# Patient Record
Sex: Female | Born: 1945
Health system: Southern US, Community
[De-identification: ages and names within clinical notes are randomized; demographics above are authoritative.]

## PROBLEM LIST (undated history)

## (undated) DIAGNOSIS — F419 Anxiety disorder, unspecified: Secondary | ICD-10-CM

## (undated) DIAGNOSIS — H269 Unspecified cataract: Secondary | ICD-10-CM

## (undated) DIAGNOSIS — F329 Major depressive disorder, single episode, unspecified: Secondary | ICD-10-CM

## (undated) DIAGNOSIS — K649 Unspecified hemorrhoids: Secondary | ICD-10-CM

## (undated) DIAGNOSIS — M1990 Unspecified osteoarthritis, unspecified site: Secondary | ICD-10-CM

## (undated) DIAGNOSIS — F32A Depression, unspecified: Secondary | ICD-10-CM

## (undated) DIAGNOSIS — M12579 Traumatic arthropathy, unspecified ankle and foot: Secondary | ICD-10-CM

## (undated) DIAGNOSIS — R739 Hyperglycemia, unspecified: Secondary | ICD-10-CM

## (undated) DIAGNOSIS — E785 Hyperlipidemia, unspecified: Secondary | ICD-10-CM

## (undated) DIAGNOSIS — K219 Gastro-esophageal reflux disease without esophagitis: Secondary | ICD-10-CM

## (undated) DIAGNOSIS — D0372 Melanoma in situ of left lower limb, including hip: Secondary | ICD-10-CM

## (undated) DIAGNOSIS — K529 Noninfective gastroenteritis and colitis, unspecified: Secondary | ICD-10-CM

## (undated) DIAGNOSIS — J309 Allergic rhinitis, unspecified: Secondary | ICD-10-CM

## (undated) DIAGNOSIS — E669 Obesity, unspecified: Secondary | ICD-10-CM

## (undated) HISTORY — DX: Anxiety disorder, unspecified: F41.9

## (undated) HISTORY — DX: Depression, unspecified: F32.A

## (undated) HISTORY — PX: FRACTURE SURGERY: SHX138

## (undated) HISTORY — DX: Melanoma in situ of left lower limb, including hip: D03.72

## (undated) HISTORY — DX: Hyperglycemia, unspecified: R73.9

## (undated) HISTORY — PX: SPINE SURGERY: SHX786

## (undated) HISTORY — DX: Unspecified osteoarthritis, unspecified site: M19.90

## (undated) HISTORY — DX: Gastro-esophageal reflux disease without esophagitis: K21.9

## (undated) HISTORY — DX: Unspecified cataract: H26.9

## (undated) HISTORY — DX: Allergic rhinitis, unspecified: J30.9

## (undated) HISTORY — DX: Noninfective gastroenteritis and colitis, unspecified: K52.9

## (undated) HISTORY — DX: Obesity, unspecified: E66.9

## (undated) HISTORY — DX: Traumatic arthropathy, unspecified ankle and foot: M12.579

## (undated) HISTORY — DX: Major depressive disorder, single episode, unspecified: F32.9

## (undated) HISTORY — DX: Hyperlipidemia, unspecified: E78.5

## (undated) HISTORY — DX: Unspecified hemorrhoids: K64.9

## (undated) HISTORY — PX: OTHER SURGICAL HISTORY: SHX169

## (undated) HISTORY — PX: MASS EXCISION: SHX2000

## (undated) HISTORY — PX: TOTAL ANKLE REPLACEMENT: SUR1218

---

## 1978-10-13 HISTORY — PX: ABDOMINAL HYSTERECTOMY: SHX81

## 1988-11-11 HISTORY — PX: BREAST SURGERY: SHX581

## 1998-10-03 ENCOUNTER — Other Ambulatory Visit: Admission: RE | Admit: 1998-10-03 | Discharge: 1998-10-03 | Payer: Self-pay | Admitting: *Deleted

## 1999-10-23 ENCOUNTER — Other Ambulatory Visit: Admission: RE | Admit: 1999-10-23 | Discharge: 1999-10-23 | Payer: Self-pay | Admitting: *Deleted

## 1999-10-30 ENCOUNTER — Encounter: Admission: RE | Admit: 1999-10-30 | Discharge: 1999-10-30 | Payer: Self-pay | Admitting: *Deleted

## 1999-10-30 ENCOUNTER — Encounter: Payer: Self-pay | Admitting: *Deleted

## 1999-12-13 HISTORY — PX: OTHER SURGICAL HISTORY: SHX169

## 1999-12-16 ENCOUNTER — Ambulatory Visit (HOSPITAL_COMMUNITY): Admission: RE | Admit: 1999-12-16 | Discharge: 1999-12-16 | Payer: Self-pay | Admitting: Gastroenterology

## 1999-12-16 ENCOUNTER — Encounter: Payer: Self-pay | Admitting: Gastroenterology

## 1999-12-31 ENCOUNTER — Observation Stay (HOSPITAL_COMMUNITY): Admission: RE | Admit: 1999-12-31 | Discharge: 2000-01-01 | Payer: Self-pay | Admitting: Urology

## 2000-11-12 ENCOUNTER — Other Ambulatory Visit: Admission: RE | Admit: 2000-11-12 | Discharge: 2000-11-12 | Payer: Self-pay | Admitting: *Deleted

## 2000-11-29 ENCOUNTER — Emergency Department (HOSPITAL_COMMUNITY): Admission: EM | Admit: 2000-11-29 | Discharge: 2000-11-29 | Payer: Self-pay

## 2000-12-03 ENCOUNTER — Emergency Department (HOSPITAL_COMMUNITY): Admission: EM | Admit: 2000-12-03 | Discharge: 2000-12-03 | Payer: Self-pay | Admitting: Emergency Medicine

## 2002-11-16 ENCOUNTER — Other Ambulatory Visit: Admission: RE | Admit: 2002-11-16 | Discharge: 2002-11-16 | Payer: Self-pay | Admitting: Obstetrics and Gynecology

## 2002-12-08 ENCOUNTER — Emergency Department (HOSPITAL_COMMUNITY): Admission: EM | Admit: 2002-12-08 | Discharge: 2002-12-09 | Payer: Self-pay | Admitting: Emergency Medicine

## 2003-05-17 ENCOUNTER — Other Ambulatory Visit: Admission: RE | Admit: 2003-05-17 | Discharge: 2003-05-17 | Payer: Self-pay | Admitting: Obstetrics and Gynecology

## 2003-09-14 ENCOUNTER — Ambulatory Visit (HOSPITAL_COMMUNITY): Admission: RE | Admit: 2003-09-14 | Discharge: 2003-09-14 | Payer: Self-pay | Admitting: Oral Surgery

## 2004-06-17 ENCOUNTER — Other Ambulatory Visit: Admission: RE | Admit: 2004-06-17 | Discharge: 2004-06-17 | Payer: Self-pay | Admitting: Obstetrics and Gynecology

## 2004-06-19 ENCOUNTER — Ambulatory Visit: Payer: Self-pay | Admitting: Family Medicine

## 2004-07-02 ENCOUNTER — Ambulatory Visit: Payer: Self-pay | Admitting: Family Medicine

## 2004-07-30 ENCOUNTER — Ambulatory Visit: Payer: Self-pay | Admitting: Family Medicine

## 2004-07-31 ENCOUNTER — Ambulatory Visit: Payer: Self-pay | Admitting: Family Medicine

## 2004-08-09 ENCOUNTER — Encounter: Admission: RE | Admit: 2004-08-09 | Discharge: 2004-08-09 | Payer: Self-pay | Admitting: Family Medicine

## 2004-09-11 HISTORY — PX: CHOLECYSTECTOMY: SHX55

## 2004-10-10 ENCOUNTER — Inpatient Hospital Stay (HOSPITAL_COMMUNITY): Admission: EM | Admit: 2004-10-10 | Discharge: 2004-10-13 | Payer: Self-pay | Admitting: Emergency Medicine

## 2004-10-10 ENCOUNTER — Encounter (INDEPENDENT_AMBULATORY_CARE_PROVIDER_SITE_OTHER): Payer: Self-pay | Admitting: *Deleted

## 2004-10-10 ENCOUNTER — Ambulatory Visit: Payer: Self-pay | Admitting: Internal Medicine

## 2004-10-19 ENCOUNTER — Ambulatory Visit: Payer: Self-pay | Admitting: Family Medicine

## 2004-10-21 ENCOUNTER — Ambulatory Visit: Payer: Self-pay | Admitting: Internal Medicine

## 2004-10-28 ENCOUNTER — Ambulatory Visit: Payer: Self-pay | Admitting: Internal Medicine

## 2004-11-12 ENCOUNTER — Ambulatory Visit: Payer: Self-pay | Admitting: Family Medicine

## 2004-11-18 ENCOUNTER — Ambulatory Visit: Payer: Self-pay | Admitting: Internal Medicine

## 2004-11-19 ENCOUNTER — Ambulatory Visit: Payer: Self-pay | Admitting: Family Medicine

## 2004-12-05 ENCOUNTER — Ambulatory Visit: Payer: Self-pay | Admitting: Gastroenterology

## 2004-12-05 ENCOUNTER — Ambulatory Visit: Payer: Self-pay | Admitting: Internal Medicine

## 2004-12-30 ENCOUNTER — Encounter: Admission: RE | Admit: 2004-12-30 | Discharge: 2004-12-30 | Payer: Self-pay | Admitting: Family Medicine

## 2005-01-21 ENCOUNTER — Ambulatory Visit: Payer: Self-pay | Admitting: Gastroenterology

## 2005-05-29 ENCOUNTER — Ambulatory Visit: Payer: Self-pay | Admitting: Family Medicine

## 2005-07-30 ENCOUNTER — Other Ambulatory Visit: Admission: RE | Admit: 2005-07-30 | Discharge: 2005-07-30 | Payer: Self-pay | Admitting: Obstetrics and Gynecology

## 2005-10-16 ENCOUNTER — Ambulatory Visit: Payer: Self-pay | Admitting: Family Medicine

## 2005-11-13 ENCOUNTER — Ambulatory Visit: Payer: Self-pay | Admitting: Family Medicine

## 2005-11-20 ENCOUNTER — Ambulatory Visit: Payer: Self-pay | Admitting: Family Medicine

## 2006-05-14 ENCOUNTER — Ambulatory Visit: Payer: Self-pay | Admitting: Family Medicine

## 2006-07-14 ENCOUNTER — Encounter: Payer: Self-pay | Admitting: Family Medicine

## 2006-10-13 HISTORY — PX: OTHER SURGICAL HISTORY: SHX169

## 2006-11-03 ENCOUNTER — Ambulatory Visit (HOSPITAL_BASED_OUTPATIENT_CLINIC_OR_DEPARTMENT_OTHER): Admission: RE | Admit: 2006-11-03 | Discharge: 2006-11-03 | Payer: Self-pay | Admitting: Urology

## 2006-11-18 ENCOUNTER — Ambulatory Visit: Payer: Self-pay | Admitting: Family Medicine

## 2006-11-18 LAB — CONVERTED CEMR LAB
ALT: 24 units/L (ref 0–40)
AST: 21 units/L (ref 0–37)
Alkaline Phosphatase: 93 units/L (ref 39–117)
BUN: 20 mg/dL (ref 6–23)
Basophils Absolute: 0.1 10*3/uL (ref 0.0–0.1)
Basophils Relative: 0.6 % (ref 0.0–1.0)
Bilirubin, Direct: 0.1 mg/dL (ref 0.0–0.3)
Calcium: 9.5 mg/dL (ref 8.4–10.5)
Chloride: 108 meq/L (ref 96–112)
Cholesterol: 184 mg/dL (ref 0–200)
Eosinophils Absolute: 0.3 10*3/uL (ref 0.0–0.6)
HCT: 42 % (ref 36.0–46.0)
HDL: 47.8 mg/dL (ref >39.0)
LDL Cholesterol: 109 mg/dL — ABNORMAL HIGH (ref 0–99)
MCV: 87.9 fL (ref 78.0–100.0)
Monocytes Absolute: 0.4 10*3/uL (ref 0.2–0.7)
Neutrophils Relative %: 70.7 % (ref 43.0–77.0)
Potassium: 4.5 meq/L (ref 3.5–5.1)
RDW: 12 % (ref 11.5–14.6)
Sodium: 144 meq/L (ref 135–145)
Total CHOL/HDL Ratio: 3.8
Total Protein: 7.1 g/dL (ref 6.0–8.3)
Triglycerides: 134 mg/dL (ref 0–149)
WBC: 9.9 10*3/uL (ref 4.5–10.5)

## 2006-12-01 ENCOUNTER — Ambulatory Visit: Payer: Self-pay | Admitting: Family Medicine

## 2007-02-03 ENCOUNTER — Encounter: Payer: Self-pay | Admitting: Family Medicine

## 2007-04-08 ENCOUNTER — Encounter: Payer: Self-pay | Admitting: Family Medicine

## 2007-04-08 DIAGNOSIS — J309 Allergic rhinitis, unspecified: Secondary | ICD-10-CM | POA: Insufficient documentation

## 2007-04-08 DIAGNOSIS — K219 Gastro-esophageal reflux disease without esophagitis: Secondary | ICD-10-CM | POA: Insufficient documentation

## 2007-04-14 HISTORY — PX: OTHER SURGICAL HISTORY: SHX169

## 2007-06-23 ENCOUNTER — Telehealth: Payer: Self-pay | Admitting: Family Medicine

## 2007-08-02 ENCOUNTER — Encounter: Payer: Self-pay | Admitting: Family Medicine

## 2007-08-15 LAB — CONVERTED CEMR LAB
Pap Smear: NORMAL
Pap Smear: NORMAL

## 2007-11-25 ENCOUNTER — Ambulatory Visit: Payer: Self-pay | Admitting: Family Medicine

## 2007-11-25 LAB — CONVERTED CEMR LAB
Glucose, Urine, Semiquant: NEGATIVE
Ketones, urine, test strip: NEGATIVE
Nitrite: NEGATIVE
Specific Gravity, Urine: 1.02
WBC Urine, dipstick: NEGATIVE
pH: 5

## 2007-12-02 ENCOUNTER — Ambulatory Visit: Payer: Self-pay | Admitting: Family Medicine

## 2007-12-02 DIAGNOSIS — F341 Dysthymic disorder: Secondary | ICD-10-CM

## 2007-12-02 DIAGNOSIS — R262 Difficulty in walking, not elsewhere classified: Secondary | ICD-10-CM | POA: Insufficient documentation

## 2007-12-02 LAB — CONVERTED CEMR LAB
ALT: 25 units/L (ref 0–35)
AST: 22 units/L (ref 0–37)
Albumin: 3.9 g/dL (ref 3.5–5.2)
Alkaline Phosphatase: 72 units/L (ref 39–117)
Basophils Relative: 0.6 % (ref 0.0–1.0)
Bilirubin, Direct: 0.1 mg/dL (ref 0.0–0.3)
Chloride: 108 meq/L (ref 96–112)
Creatinine, Ser: 1 mg/dL (ref 0.4–1.2)
HCT: 39 % (ref 36.0–46.0)
HDL: 39.5 mg/dL (ref >39.0)
Hemoglobin: 13.2 g/dL (ref 12.0–15.0)
Lymphocytes Relative: 23.8 % (ref 12.0–46.0)
MCV: 88.7 fL (ref 78.0–100.0)
Monocytes Absolute: 0.5 10*3/uL (ref 0.1–1.0)
Neutro Abs: 5 10*3/uL (ref 1.4–7.7)
Neutrophils Relative %: 67.5 % (ref 43.0–77.0)
Platelets: 234 10*3/uL (ref 150–400)
Potassium: 4.2 meq/L (ref 3.5–5.1)
Sodium: 146 meq/L — ABNORMAL HIGH (ref 135–145)
TSH: 2 microintl units/mL (ref 0.35–5.50)
Total CHOL/HDL Ratio: 4.9
VLDL: 33 mg/dL (ref 0–40)
WBC: 7.4 10*3/uL (ref 4.5–10.5)

## 2008-09-28 ENCOUNTER — Encounter: Payer: Self-pay | Admitting: Family Medicine

## 2008-09-29 ENCOUNTER — Ambulatory Visit (HOSPITAL_COMMUNITY): Admission: RE | Admit: 2008-09-29 | Discharge: 2008-09-30 | Payer: Self-pay | Admitting: Neurosurgery

## 2008-10-18 ENCOUNTER — Encounter: Payer: Self-pay | Admitting: Family Medicine

## 2008-11-27 ENCOUNTER — Encounter: Payer: Self-pay | Admitting: Family Medicine

## 2008-12-18 ENCOUNTER — Ambulatory Visit: Payer: Self-pay | Admitting: Family Medicine

## 2008-12-18 LAB — CONVERTED CEMR LAB
Bilirubin Urine: NEGATIVE
Blood in Urine, dipstick: NEGATIVE
Glucose, Urine, Semiquant: NEGATIVE
Nitrite: NEGATIVE
Protein, U semiquant: NEGATIVE
Urobilinogen, UA: 0.2
WBC Urine, dipstick: NEGATIVE
pH: 5.5

## 2008-12-26 ENCOUNTER — Ambulatory Visit: Payer: Self-pay | Admitting: Family Medicine

## 2008-12-26 ENCOUNTER — Telehealth: Payer: Self-pay | Admitting: Family Medicine

## 2008-12-26 DIAGNOSIS — E669 Obesity, unspecified: Secondary | ICD-10-CM

## 2008-12-26 DIAGNOSIS — Z9889 Other specified postprocedural states: Secondary | ICD-10-CM

## 2008-12-26 LAB — CONVERTED CEMR LAB
ALT: 38 units/L — ABNORMAL HIGH (ref 0–35)
AST: 32 units/L (ref 0–37)
Albumin: 3.7 g/dL (ref 3.5–5.2)
Alkaline Phosphatase: 65 units/L (ref 39–117)
Basophils Relative: 0.7 % (ref 0.0–3.0)
Bilirubin, Direct: 0.1 mg/dL (ref 0.0–0.3)
CO2: 31 meq/L (ref 19–32)
CRP, High Sensitivity: 10 — ABNORMAL HIGH (ref 0.00–5.00)
Chloride: 112 meq/L (ref 96–112)
Creatinine, Ser: 0.8 mg/dL (ref 0.4–1.2)
Eosinophils Absolute: 0.2 10*3/uL (ref 0.0–0.7)
Eosinophils Relative: 2.8 % (ref 0.0–5.0)
Glucose, Bld: 115 mg/dL — ABNORMAL HIGH (ref 70–99)
HDL: 47.5 mg/dL (ref >39.00)
MCHC: 35 g/dL (ref 30.0–36.0)
Monocytes Absolute: 0.4 10*3/uL (ref 0.1–1.0)
Monocytes Relative: 5.2 % (ref 3.0–12.0)
Neutrophils Relative %: 66.5 % (ref 43.0–77.0)
Platelets: 200 10*3/uL (ref 150.0–400.0)
RBC: 4.18 M/uL (ref 3.87–5.11)
Sodium: 145 meq/L (ref 135–145)
Total CHOL/HDL Ratio: 4
VLDL: 29.2 mg/dL (ref 0.0–40.0)

## 2009-02-01 ENCOUNTER — Encounter: Payer: Self-pay | Admitting: Family Medicine

## 2009-02-28 ENCOUNTER — Encounter: Payer: Self-pay | Admitting: Family Medicine

## 2009-03-28 ENCOUNTER — Ambulatory Visit: Payer: Self-pay | Admitting: Family Medicine

## 2009-04-11 LAB — CONVERTED CEMR LAB
AST: 24 units/L (ref 0–37)
Bilirubin, Direct: 0 mg/dL (ref 0.0–0.3)
HDL: 44.5 mg/dL (ref >39.00)
LDL Cholesterol: 58 mg/dL (ref 0–99)
Total CHOL/HDL Ratio: 3
Triglycerides: 167 mg/dL — ABNORMAL HIGH (ref 0.0–149.0)

## 2009-04-12 ENCOUNTER — Telehealth: Payer: Self-pay

## 2009-04-12 ENCOUNTER — Ambulatory Visit: Payer: Self-pay | Admitting: Family Medicine

## 2009-08-20 ENCOUNTER — Ambulatory Visit: Payer: Self-pay | Admitting: Family Medicine

## 2009-10-02 ENCOUNTER — Telehealth: Payer: Self-pay | Admitting: Family Medicine

## 2009-12-25 ENCOUNTER — Ambulatory Visit: Payer: Self-pay | Admitting: Family Medicine

## 2010-01-01 ENCOUNTER — Ambulatory Visit: Payer: Self-pay | Admitting: Family Medicine

## 2010-01-01 DIAGNOSIS — M12579 Traumatic arthropathy, unspecified ankle and foot: Secondary | ICD-10-CM | POA: Insufficient documentation

## 2010-01-02 LAB — CONVERTED CEMR LAB
ALT: 23 units/L (ref 0–35)
AST: 22 units/L (ref 0–37)
Albumin: 4 g/dL (ref 3.5–5.2)
BUN: 14 mg/dL (ref 6–23)
Bilirubin, Direct: 0.1 mg/dL (ref 0.0–0.3)
CO2: 31 meq/L (ref 19–32)
Chloride: 106 meq/L (ref 96–112)
Cholesterol: 141 mg/dL (ref 0–200)
Creatinine, Ser: 1 mg/dL (ref 0.4–1.2)
Eosinophils Absolute: 0.2 10*3/uL (ref 0.0–0.7)
Glucose, Bld: 105 mg/dL — ABNORMAL HIGH (ref 70–99)
Lymphocytes Relative: 20.7 % (ref 12.0–46.0)
MCHC: 34 g/dL (ref 30.0–36.0)
Monocytes Relative: 5.1 % (ref 3.0–12.0)
Neutrophils Relative %: 71.7 % (ref 43.0–77.0)
Nitrite: NEGATIVE
Platelets: 250 10*3/uL (ref 150.0–400.0)
Potassium: 4.8 meq/L (ref 3.5–5.1)
RBC: 4.32 M/uL (ref 3.87–5.11)
RDW: 13.2 % (ref 11.5–14.6)
Specific Gravity, Urine: 1.02
Total Protein: 6.9 g/dL (ref 6.0–8.3)
Triglycerides: 172 mg/dL — ABNORMAL HIGH (ref 0.0–149.0)
WBC Urine, dipstick: NEGATIVE
pH: 5.5

## 2010-01-03 ENCOUNTER — Telehealth: Payer: Self-pay | Admitting: Family Medicine

## 2010-02-06 ENCOUNTER — Encounter: Payer: Self-pay | Admitting: Family Medicine

## 2010-03-04 ENCOUNTER — Ambulatory Visit: Payer: Self-pay | Admitting: Family Medicine

## 2010-03-04 DIAGNOSIS — N390 Urinary tract infection, site not specified: Secondary | ICD-10-CM

## 2010-03-04 LAB — CONVERTED CEMR LAB
Ketones, urine, test strip: NEGATIVE
Nitrite: NEGATIVE
Urobilinogen, UA: 0.2

## 2010-03-05 ENCOUNTER — Encounter: Payer: Self-pay | Admitting: Family Medicine

## 2010-04-16 ENCOUNTER — Ambulatory Visit: Payer: Self-pay | Admitting: Family Medicine

## 2010-05-16 ENCOUNTER — Ambulatory Visit: Payer: Self-pay | Admitting: Family Medicine

## 2010-05-16 LAB — CONVERTED CEMR LAB
Bilirubin Urine: NEGATIVE
Protein, U semiquant: NEGATIVE
Specific Gravity, Urine: <1.005
Urobilinogen, UA: 0.2

## 2010-05-17 ENCOUNTER — Encounter: Payer: Self-pay | Admitting: Family Medicine

## 2010-06-13 HISTORY — PX: JOINT REPLACEMENT: SHX530

## 2010-06-26 ENCOUNTER — Encounter: Payer: Self-pay | Admitting: Family Medicine

## 2010-08-15 NOTE — Assessment & Plan Note (Signed)
Summary: Uti/dm   Vital Signs:  Patient profile:   65 year old female Weight:      205 pounds Temp:     98.4 degrees F oral BP sitting:   120 / 80  (left arm) Cuff size:   large  Vitals Entered By: Sid Falcon LPN (May 16, 2010 3:00 PM)  History of Present Illness: Patient seen with onset this morning burning with urination and frequency. One leftover Bactrim double strength which she took. Denies fever, nausea, vomiting, or any back pain. Drinking plenty of fluids. no vag discharge.  Allergies: 1)  ! Percocet (Oxycodone-Acetaminophen) 2)  ! * Latex 3)  Hydrocodone-Acetaminophen (Hydrocodone-Acetaminophen)  Past History:  Past Medical History: Last updated: 04/08/2007 Allergic rhinitis GERD PMH reviewed for relevance  Review of Systems  The patient denies fever, abdominal pain, hematuria, and incontinence.    Physical Exam  General:  Well-developed,well-nourished,in no acute distress; alert,appropriate and cooperative throughout examination Neck:  No deformities, masses, or tenderness noted. Lungs:  Normal respiratory effort, chest expands symmetrically. Lungs are clear to auscultation, no crackles or wheezes. Heart:  normal rate and regular rhythm.     Impression & Recommendations:  Problem # 1:  UTI (ICD-599.0) urine cx and start Cipro. Her updated medication list for this problem includes:    Ciprofloxacin Hcl 500 Mg Tabs (Ciprofloxacin hcl) ..... One by mouth two times a day for 7 days  Orders: UA Dipstick w/o Micro (manual) (16109) T-Culture, Urine (60454-09811)  Complete Medication List: 1)  Bayer Low Strength 81 Mg Tbec (Aspirin) 2)  Multivitamins Tabs (Multiple vitamin) 3)  Allegra 180 Mg Tabs (Fexofenadine hcl) .Marland Kitchen.. 1 by mouth once daily 4)  Alprazolam 1 Mg Tabs (Alprazolam) .Marland Kitchen.. 1 morning midafternoon and 2 hs for stress 5)  Budeprion Xl 300 Mg Tb24 (Bupropion hcl) .Marland Kitchen.. 1 by mouth once daily 6)  Lipitor 20 Mg Tabs (Atorvastatin calcium) .Marland Kitchen..  1 once daily for lipids 7)  Caltrate 600+d 600-400 Mg-unit Tabs (Calcium carbonate-vitamin d) .... Take 1 tablet by mouth two times a day 8)  Celebrex 200 Mg Caps (Celecoxib) .... Once daily 9)  Ciprofloxacin Hcl 500 Mg Tabs (Ciprofloxacin hcl) .... One by mouth two times a day for 7 days  Patient Instructions: 1)  Drink plenty of fluids up to 3-4 quarts a day. Cranberry juice is especially recommended in addition to large amounts of water. Avoid caffeine & carbonated drinks, they tend to irritate the bladder, Return in 3-5 days if you're not better: sooner if you're feeling worse.  Prescriptions: CIPROFLOXACIN HCL 500 MG TABS (CIPROFLOXACIN HCL) one by mouth two times a day for 7 days  #14 x 0   Entered and Authorized by:   Evelena Peat MD   Signed by:   Evelena Peat MD on 05/16/2010   Method used:   Electronically to        [facility]. [phone]* (retail)       [phone] [address]       [place], [place]  [address]       Ph: [phone]       Fax: [phone]   RxID:   [id number]    Orders Added: 1)  UA Dipstick w/o Micro (manual) [81002] 2)  Est. Patient Level III [25956] 3)  T-Culture, Urine [[id number]]    Laboratory Results   Urine Tests    Routine Urinalysis   Color: lt. yellow Appearance: Hazy Glucose: negative   (  Normal Range: Negative) Bilirubin: negative   (Normal Range: Negative) Ketone: negative   (Normal Range: Negative) Spec. Gravity: <1.005   (Normal Range: 1.003-1.035) Blood: negative   (Normal Range: Negative) pH: 5.0   (Normal Range: 5.0-8.0) Protein: negative   (Normal Range: Negative) Urobilinogen: 0.2   (Normal Range: 0-1) Nitrite: negative   (Normal Range: Negative) Leukocyte Esterace: moderate   (Normal Range: Negative)    Comments: Sid Falcon LPN  May 16, 2010 3:09 PM

## 2010-08-15 NOTE — Assessment & Plan Note (Signed)
Summary: cpx/no pap/njr   Vital Signs:  Patient profile:   65 year old female Height:      68 inches Weight:      208 pounds BMI:     31.74 O2 Sat:      97 % Temp:     98.2 degrees F Pulse rate:   86 / minute Pulse rhythm:   regular BP sitting:   140 / 80  (left arm)  Vitals Entered By: Julie Spice, RN (January 01, 2010 8:53 AM)  CC: cpx no pap saw Dr Julie [name] last week for papPt did not want EKG done today denies any chest pain or discomfort/   History of Present Illness: This 65 year old white married female in for a complete physical examination Relates a history of having fractured her right ankle at age 7, to have a hiatal replacement at [facility] Complained of being tired all complained of pain over the right lower back over the sacroiliac joint and taken ibuprofen for treatment and relief Patient had cystoscopy in 2005 by Dr. Jacquelyne [name] who well urologically at this time The patient under considerable stress has multiple family problems her and needs to continue on alprazolam and Wellbutrin. Past by Dr. Rana [name] gynecologist this year. Had mammogram February 2000 level and and bone density Lungs stopping exam done 2006 by Dr. Doreatha [name] a bile  Allergies: 1)  ! Percocet (Oxycodone-Acetaminophen) 2)  ! * Latex  Past History:  Past Medical History: Last updated: 04/08/2007 Allergic rhinitis GERD  Past Surgical History: Last updated: 12/26/2008 Cholecystectomy  03/06 back surgery for ruptured disc  march 2010 by dr Julie [name]   Risk Factors: Smoking Status: never (04/08/2007)  Review of Systems      See HPI  The patient denies anorexia, fever, weight loss, weight gain, vision loss, decreased hearing, hoarseness, chest pain, syncope, dyspnea on exertion, peripheral edema, prolonged cough, headaches, hemoptysis, abdominal pain, melena, hematochezia, severe indigestion/heartburn, hematuria, incontinence, genital sores, muscle weakness, suspicious skin lesions, transient blindness,  difficulty walking, depression, unusual weight change, abnormal bleeding, enlarged lymph nodes, angioedema, breast masses, and testicular masses.    Physical Exam  General:  Well-developed,well-nourished,in no acute distress; alert,appropriate and cooperative throughout examination Head:  Normocephalic and atraumatic without obvious abnormalities. No apparent alopecia or balding. Eyes:  No corneal or conjunctival inflammation noted. EOMI. Perrla. Funduscopic exam benign, without hemorrhages, exudates or papilledema. Vision grossly normal. Ears:  External ear exam shows no significant lesions or deformities.  Otoscopic examination reveals clear canals, tympanic membranes are intact bilaterally without bulging, retraction, inflammation or discharge. Hearing is grossly normal bilaterally. Nose:  pale nasal mucosa was boggy with clear drainage Mouth:  Oral mucosa and oropharynx without lesions or exudates.  Teeth in good repair. Neck:  No deformities, masses, or tenderness noted. Chest Wall:  No deformities, masses, or tenderness noted. Breasts:  gynecologist examined Lungs:  Normal respiratory effort, chest expands symmetrically. Lungs are clear to auscultation, no crackles or wheezes. Heart:  Normal rate and regular rhythm. S1 and S2 normal without gallop, murmur, click, rub or other extra sounds. Abdomen:  Bowel sounds positive,abdomen soft and non-tender without masses, organomegaly or hernias noted. Rectal:  oncologist examined Genitalia:  GYN exam Msk:  Limited movement and tenderness of the right ankle Pulses:  R and L carotid,radial,femoral,dorsalis pedis and posterior tibial pulses are full and equal bilaterally Extremities:  No clubbing, cyanosis, edema, or deformity noted with normal full range of motion of all joints.   Neurologic:  No cranial nerve  deficits noted. Station and gait are normal. Plantar reflexes are down-going bilaterally. DTRs are symmetrical throughout. Sensory, motor  and coordinative functions appear intact. Skin:  Intact without suspicious lesions or rashes Cervical Nodes:  No lymphadenopathy noted Axillary Nodes:  No palpable lymphadenopathy Inguinal Nodes:  No significant adenopathy Psych:  Cognition and judgment appear intact. Alert and cooperative with normal attention span and concentration. No apparent delusions, illusions, hallucinations   Impression & Recommendations:  Problem # 1:  ARTHRITIS, TRAUMATIC, ANKLE (ICD-716.17) Assessment Deteriorated ankle replacement planned  Problem # 2:  LAMINECTOMY, LUMBAR, HX OF (ICD-V45.89) Assessment: Unchanged  Problem # 3:  PHYSICAL EXAMINATION (ICD-V70.0) Assessment: Unchanged  Problem # 4:  ANXIETY DEPRESSION (ICD-300.4) Assessment: Unchanged  Problem # 5:  EXOGENOUS OBESITY (ICD-278.00) Assessment: Unchanged  Problem # 6:  ALLERGIC RHINITIS (ICD-477.9) Assessment: Unchanged  Her updated medication list for this problem includes:    Allegra 180 Mg Tabs (Fexofenadine hcl) .Marland Kitchen... 1 by mouth once daily  Complete Medication List: 1)  Bayer Low Strength 81 Mg Tbec (Aspirin) 2)  Multivitamins Tabs (Multiple vitamin) 3)  Allegra 180 Mg Tabs (Fexofenadine hcl) .Marland Kitchen.. 1 by mouth once daily 4)  Alprazolam 1 Mg Tabs (Alprazolam) .Marland Kitchen.. 1 morning midafternoon and 2 hs for stress 5)  Budeprion Xl 300 Mg Tb24 (Bupropion hcl) .Marland Kitchen.. 1 by mouth once daily 6)  Lipitor 20 Mg Tabs (Atorvastatin calcium) .Marland Kitchen.. 1 once daily for lipids 7)  Caltrate 600+d 600-400 Mg-unit Tabs (Calcium carbonate-vitamin d) .... Take 1 tablet by mouth two times a day 8)  Ciprofloxacin Hcl 500 Mg Tabs (Ciprofloxacin hcl) .Marland Kitchen.. 1 by mouth two times a day  Patient Instructions: 1)  You need to lose weight. Consider a lower calorie diet and regular exercise.  2)  Since having ankle replacement you need to lose wight 3)  reilled medications 4)  physical exam negative otherwise Prescriptions: CIPROFLOXACIN HCL 500 MG TABS (CIPROFLOXACIN  HCL) 1 by mouth two times a day  #20 x 0   Entered by:   Julie Spice, RN   Authorized by:   Judithann Sheen MD   Signed by:   Julie Spice, RN on 01/03/2010   Method used:   Electronically to        [facility]. [phone]* (retail)       [phone] [address]       [place], [place]  [address]       Ph: [phone]       Fax: [phone]   RxID:   [id number] ALPRAZOLAM 1 MG  TABS (ALPRAZOLAM) 1 morning midafternoon and 2 hs for stress  #100 x 5   Entered and Authorized by:   Judithann Sheen MD   Signed by:   Judithann Sheen MD on 01/01/2010   Method used:   Print then Give to Patient   RxID:   [id number] LIPITOR 20 MG TABS (ATORVASTATIN CALCIUM) 1 once daily for lipids  #30 x 11   Entered and Authorized by:   Judithann Sheen MD   Signed by:   Judithann Sheen MD on 01/01/2010   Method used:   Electronically to        [facility]. [phone]* (retail)       [phone] [address]       [place], [place]  [address]  Ph: [phone]       Fax: [phone]   RxID:   [id number] BUDEPRION XL 300 MG  TB24 (BUPROPION HCL) 1 by mouth once daily  #30 x 11   Entered and Authorized by:   Judithann Sheen MD   Signed by:   Judithann Sheen MD on 01/01/2010   Method used:   Electronically to        [facility]. [phone]* (retail)       [phone] [address]       [place], [place]  [address]       Ph: [phone]       Fax: [phone]   RxID:   [id number] ALLEGRA 180 MG  TABS (FEXOFENADINE HCL) 1 by mouth once daily  #30 x 11   Entered and Authorized by:   Judithann Sheen MD   Signed by:   Judithann Sheen MD on 01/01/2010   Method used:   Electronically to        [facility]. [phone]* (retail)       [phone] [address]       [place], [place]  [address]       Ph: [phone]       Fax: [phone]    RxID:   [id number]

## 2010-08-15 NOTE — Assessment & Plan Note (Signed)
Summary: COLD, CONGESTION // RS   Vital Signs:  Patient profile:   65 year old female Weight:      203 pounds Temp:     99.3 degrees F oral BP sitting:   122 / 88  (left arm) Cuff size:   large  Vitals Entered By: Sid Falcon LPN (August 20, 2009 1:54 PM) CC: Cold, congesdtion X 1 month   History of Present Illness: Acute visit for 3 weeks of cough and initially this started as a viral URI. Her nasal congestive symptoms have improved. Cough is mostly dry. No fever or wheezing. No history of smoking. Denies any dyspnea, pleuritic pain, or hemoptysis. Has tried over-the-counter cough medicine with good suppression of night time cough.  Allergies: 1)  ! Percocet (Oxycodone-Acetaminophen)  Past History:  Past Medical History: Last updated: 04/08/2007 Allergic rhinitis GERD PMH-FH-SH reviewed for relevance  Review of Systems  The patient denies anorexia, fever, weight loss, syncope, dyspnea on exertion, peripheral edema, and hemoptysis.    Physical Exam  General:  Well-developed,well-nourished,in no acute distress; alert,appropriate and cooperative throughout examination Ears:  External ear exam shows no significant lesions or deformities.  Otoscopic examination reveals clear canals, tympanic membranes are intact bilaterally without bulging, retraction, inflammation or discharge. Hearing is grossly normal bilaterally. Nose:  External nasal examination shows no deformity or inflammation. Nasal mucosa are pink and moist without lesions or exudates. Mouth:  Oral mucosa and oropharynx without lesions or exudates.  Teeth in good repair. Neck:  No deformities, masses, or tenderness noted. Lungs:  Normal respiratory effort, chest expands symmetrically. Lungs are clear to auscultation, no crackles or wheezes. Heart:  Normal rate and regular rhythm. S1 and S2 normal without gallop, murmur, click, rub or other extra sounds.   Impression & Recommendations:  Problem # 1:  COUGH  (ICD-786.2) suspect related to recent acute bronchitis. Given duration of cough we'll start antibiotic. Try over-the-counter Mucinex  Complete Medication List: 1)  Bayer Low Strength 81 Mg Tbec (Aspirin) 2)  Multivitamins Tabs (Multiple vitamin) 3)  Allegra 180 Mg Tabs (Fexofenadine hcl) .Marland Kitchen.. 1 by mouth once daily 4)  Alprazolam 1 Mg Tabs (Alprazolam) .Marland Kitchen.. 1 morning midafternoon and 2 hs for stress 5)  Budeprion Xl 300 Mg Tb24 (Bupropion hcl) .Marland Kitchen.. 1 by mouth once daily 6)  Lipitor 20 Mg Tabs (Atorvastatin calcium) .Marland Kitchen.. 1 once daily for lipids 7)  Azithromycin 250 Mg Tabs (Azithromycin) .... 2 by mouth today then one by mouth once daily for 4 days.  Patient Instructions: 1)  Consider over-the-counter plain Mucinex 2 tablets twice daily 2)  Followup with Dr. Scotty Court if cough is not resolving over the next couple of weeks Prescriptions: AZITHROMYCIN 250 MG TABS (AZITHROMYCIN) 2 by mouth today then one by mouth once daily for 4 days.  #6 x 0   Entered and Authorized by:   Evelena Peat MD   Signed by:   Evelena Peat MD on 08/20/2009   Method used:   Electronically to        [facility]. [phone]* (retail)       [phone] [address]       [place], [place]  [address]       Ph: [phone]       Fax: [phone]   RxID:   [id number]

## 2010-08-15 NOTE — Assessment & Plan Note (Signed)
Summary: ?uti/njr   Vital Signs:  Patient profile:   65 year old female Weight:      209 pounds Temp:     98.3 degrees F oral BP sitting:   142 / 86  (left arm) Cuff size:   regular  Vitals Entered By: Raechel Ache, RN (March 04, 2010 3:52 PM) CC: C/o frequency, dysuria, and LBP.   History of Present Illness: Here for 5 days of urinary burning and urgency. No fever or nausea. drinking water. She took a course of Cipro in June for similar symptoms, and she seemed to get better at that time.   Allergies: 1)  ! Percocet (Oxycodone-Acetaminophen) 2)  ! * Latex  Past History:  Past Medical History: Reviewed history from 04/08/2007 and no changes required. Allergic rhinitis GERD  Past Surgical History: Reviewed history from 12/26/2008 and no changes required. Cholecystectomy  03/06 back surgery for ruptured disc  march 2010 by dr Venetia Maxon   Review of Systems  The patient denies anorexia, fever, weight loss, weight gain, vision loss, decreased hearing, hoarseness, chest pain, syncope, dyspnea on exertion, peripheral edema, prolonged cough, headaches, hemoptysis, abdominal pain, melena, hematochezia, severe indigestion/heartburn, hematuria, incontinence, genital sores, muscle weakness, suspicious skin lesions, transient blindness, difficulty walking, depression, unusual weight change, abnormal bleeding, enlarged lymph nodes, angioedema, breast masses, and testicular masses.    Physical Exam  General:  Well-developed,well-nourished,in no acute distress; alert,appropriate and cooperative throughout examination Abdomen:  Bowel sounds positive,abdomen soft and non-tender without masses, organomegaly or hernias noted.   Impression & Recommendations:  Problem # 1:  UTI (ICD-599.0)  The following medications were removed from the medication list:    Ciprofloxacin Hcl 500 Mg Tabs (Ciprofloxacin hcl) .Marland Kitchen... 1 by mouth two times a day Her updated medication list for this problem  includes:    Bactrim Ds 800-160 Mg Tabs (Sulfamethoxazole-trimethoprim) .Marland Kitchen..Marland Kitchen Two times a day  Orders: T-Culture, Urine ([id number]) UA Dipstick w/o Micro (manual) (28413)  Complete Medication List: 1)  Bayer Low Strength 81 Mg Tbec (Aspirin) 2)  Multivitamins Tabs (Multiple vitamin) 3)  Allegra 180 Mg Tabs (Fexofenadine hcl) .Marland Kitchen.. 1 by mouth once daily 4)  Alprazolam 1 Mg Tabs (Alprazolam) .Marland Kitchen.. 1 morning midafternoon and 2 hs for stress 5)  Budeprion Xl 300 Mg Tb24 (Bupropion hcl) .Marland Kitchen.. 1 by mouth once daily 6)  Lipitor 20 Mg Tabs (Atorvastatin calcium) .Marland Kitchen.. 1 once daily for lipids 7)  Caltrate 600+d 600-400 Mg-unit Tabs (Calcium carbonate-vitamin d) .... Take 1 tablet by mouth two times a day 8)  Bactrim Ds 800-160 Mg Tabs (Sulfamethoxazole-trimethoprim) .... Two times a day  Patient Instructions: 1)  Try Bactrim DS. Will culture the urine today Prescriptions: BACTRIM DS 800-160 MG TABS (SULFAMETHOXAZOLE-TRIMETHOPRIM) two times a day  #20 x 0   Entered and Authorized by:   Nelwyn Salisbury MD   Signed by:   Nelwyn Salisbury MD on 03/04/2010   Method used:   Electronically to        [facility]. [phone]* (retail)       [phone] [address]       [place], [place]  [address]       Ph: [phone]       Fax: [phone]   RxID:   [id number]   Laboratory Results   Urine Tests    Routine Urinalysis   Color: yellow Appearance: Hazy Glucose: negative   (Normal Range: Negative) Bilirubin: negative   (Normal  Range: Negative) Ketone: negative   (Normal Range: Negative) Spec. Gravity: 1.015   (Normal Range: 1.003-1.035) Blood: moderate   (Normal Range: Negative) pH: 5.0   (Normal Range: 5.0-8.0) Protein: 30   (Normal Range: Negative) Urobilinogen: 0.2   (Normal Range: 0-1) Nitrite: negative   (Normal Range: Negative) Leukocyte Esterace: moderate   (Normal Range: Negative)

## 2010-08-15 NOTE — Progress Notes (Signed)
Summary: UTI sx, requesting RX  Phone Note Call from Patient   Caller: Patient Call For: Judithann Sheen MD Summary of Call: Pt C/O UTI symptoms, started this am, burning and pain, very little urine, but urgency.  Offered OV, pt states, "I was just there, I have a hx of these problems since age 66. [facility] Initial call taken by: Sid Falcon LPN,  January 03, 2010 11:47 AM  Follow-up for Phone Call        ok per dr Scotty Court will call in cipro  Follow-up by: Pura Spice, RN,  January 03, 2010 5:11 PM

## 2010-08-15 NOTE — Assessment & Plan Note (Signed)
Summary: FLU SHOT // RS   Nurse Visit   Allergies: 1)  ! Percocet (Oxycodone-Acetaminophen) 2)  ! * Latex  Immunizations Administered:  Influenza Vaccine # 1:    Vaccine Type: Fluvax 3+    Site: left deltoid    Mfr: [employer]    Dose: 0.5 ml    Route: IM    Given by: Kathrynn Speed CMA    Exp. Date: 01/11/2011    Lot #: [id number]    VIS given: 02/05/10 version given April 16, 2010.  Flu Vaccine Consent Questions:    Do you have a history of severe allergic reactions to this vaccine? no    Any prior history of allergic reactions to egg and/or gelatin? no    Do you have a sensitivity to the preservative Thimersol? no    Do you have a past history of Guillan-Barre Syndrome? no    Do you currently have an acute febrile illness? no    Have you ever had a severe reaction to latex? no    Vaccine information given and explained to patient? yes    Are you currently pregnant? no  Orders Added: 1)  Flu Vaccine 67yrs + [90658] 2)  Admin 1st Vaccine [09811]

## 2010-08-15 NOTE — Progress Notes (Signed)
Summary: added test  Phone Note Call from Patient Call back at Home Phone [phone]   Caller: Patient Call For: Judithann Sheen MD Summary of Call: pt would like to add c-reation protein test to her cpx labs Initial call taken by: Heron Sabins,  October 02, 2009 12:40 PM  Follow-up for Phone Call        OK Coronary artery disease Follow-up by: Judithann Sheen MD,  October 10, 2009 3:38 PM  Additional Follow-up for Phone Call Additional follow up Details #1::        lab test added to cpx labs Additional Follow-up by: Heron Sabins,  October 10, 2009 3:47 PM

## 2010-08-29 ENCOUNTER — Ambulatory Visit (INDEPENDENT_AMBULATORY_CARE_PROVIDER_SITE_OTHER): Payer: PRIVATE HEALTH INSURANCE | Admitting: Internal Medicine

## 2010-08-29 ENCOUNTER — Encounter: Payer: Self-pay | Admitting: Internal Medicine

## 2010-08-29 VITALS — BP 126/84 | HR 96 | Temp 98.1°F | Ht 68.0 in | Wt 196.0 lb

## 2010-08-29 DIAGNOSIS — J069 Acute upper respiratory infection, unspecified: Secondary | ICD-10-CM

## 2010-08-29 MED ORDER — BENZONATATE 100 MG PO CAPS: 100.0000 mg | ORAL_CAPSULE | Freq: Three times a day (TID) | ORAL | Status: DC | PRN

## 2010-08-29 MED ORDER — AZITHROMYCIN 250 MG PO TABS: ORAL_TABLET | ORAL | Status: AC

## 2010-08-29 NOTE — Progress Notes (Signed)
  Subjective:     Julie [name] is a 65 y.o. female who presents for evaluation of symptoms of a URI. Symptoms include congestion, no  fever and non productive cough. Onset of symptoms was 5 days ago, and has been unchanged since that time. Treatment to date: none.  The following portions of the patient's history were reviewed and updated as appropriate: allergies, current medications and problem list.  Review of Systems Pertinent items are noted in HPI.   Objective:    General appearance: alert and cooperative Head: Normocephalic, without obvious abnormality, atraumatic Eyes: conjunctivae/corneas clear. PERRL, EOM's intact. Fundi benign. Ears: normal TM's and external ear canals both ears Nose: Nares normal. Septum midline. Mucosa normal. No drainage or sinus tenderness. Throat: lips, mucosa, and tongue normal; teeth and gums normal Lungs: clear to auscultation bilaterally Heart: regular rate and rhythm, S1, S2 normal, no murmur, click, rub or gallop Skin: Skin color, texture, turgor normal. No rashes or lesions   Assessment:    bronchitis   Plan:  Given abx prescription to hold. Begin if no improvement of sx's after total duration of 8-10 days. Tessalon perles prn cough.  Discussed diagnosis and treatment of URI. Follow up as needed.

## 2010-10-24 LAB — CBC
Hemoglobin: 13.8 g/dL (ref 12.0–15.0)
MCHC: 34.2 g/dL (ref 30.0–36.0)
RBC: 4.58 MIL/uL (ref 3.87–5.11)

## 2010-11-01 ENCOUNTER — Encounter: Payer: Self-pay | Admitting: Family Medicine

## 2010-11-26 NOTE — Op Note (Signed)
NAMEBRITTLEY, [name]             ACCOUNT NO.:  [id number]   MEDICAL RECORD NO.:  [id number]          PATIENT TYPE:  OIB   LOCATION:  3528                         FACILITY:  [facility]   PHYSICIAN:  Danae Orleans. Venetia Maxon, M.D.  DATE OF BIRTH:  Sep 20, 1945   DATE OF PROCEDURE:  09/29/2008  DATE OF DISCHARGE:                               OPERATIVE REPORT   PREOPERATIVE DIAGNOSES:  Left L5-S1 herniated lumbar disk with  spondylosis, degenerative disk disease, and radiculopathy.   POSTOPERATIVE DIAGNOSES:  Left L5-S1 herniated lumbar disk with  spondylosis, degenerative disk disease, and radiculopathy.   PROCEDURE:  Left L5-S1 hemi-semi-laminectomy with microdiskectomy and  microdissection.   SURGEON:  Danae Orleans. Venetia Maxon, MD   ASSISTANT:  Coletta Memos, MD   ANESTHESIA:  General endotracheal anesthesia.   ESTIMATED BLOOD LOSS:  Minimal.   COMPLICATIONS:  None.   DISPOSITION:  Recovery.   INDICATIONS:  Julie [name] is a 65 year old woman with left foot drop  who developed significant sudden onset of low back and left lower  extremity pain.  She is admitted on same day procedure basis for left L5-  S1 microdiskectomy for a far lateral disk herniation at the L5 level,  compressing the left L5 nerve root.   PROCEDURE:  Julie [name] was brought to the operating room.  Following  satisfactory and uncomplicated induction of general endotracheal  anesthesia and placement of intravenous lines, the patient was placed in  a prone position on Wilson frame.  Her low back was then prepped and  draped in usual sterile fashion.  Area of planned incision was  infiltrated with local lidocaine.  Subsequently, incision was made in  the midline from overlying L5-S1 interspace, carried through copious  adipose tissue.  The lumbodorsal fascia was incised on left side of  midline sharply, subperiosteal dissection was performed exposing the L5-  S1 interspace.  Intraoperative x-ray confirmed correct orientation  at  the appropriate level with a marker probe at the L5-S1 interspace.  A  hemi laminectomy of L5 was then performed with high-speed drill and  completed with Kerrison rongeurs.  Ligamentum flavum was detached and  removed in piecemeal fashion.  The thecal sac was decompressed and using  an operating microscope, the lateral aspect of thecal sac was mobilized  medially exposing the inferior aspect of the L5 nerve root along with  the multiple fragments of herniated disk material.  These fragments were  removed resulting in significant decompression of the thecal sac and the  L5 nerve root.  Variety of ball-tip probes were used to palpate the  canal and through the foramen, there did not appear to be any residual  nerve root compression.  Hemostasis was assured with Gelfoam soaked in  thrombin and then Depo-Medrol and fentanyl were placed in the operative  site.  The lumbodorsal fascia was closed with 0 Vicryl sutures.  Subcutaneous tissues were reapproximated with 2-0 Vicryl, and  interrupted inverted sutures  and skin edges were reapproximated with interrupted 3-0 Vicryl and  subcuticular stitch.  Wound was dressed with Dermabond.  The patient was  extubated in the operating room and  transferred to recovery room in  stable and satisfactory condition having tolerated the operation well.  Counts were correct at the end of the case.      Danae Orleans. Venetia Maxon, M.D.  Electronically Signed     JDS/MEDQ  D:  09/29/2008  T:  09/30/2008  Job:  [id number]

## 2010-11-29 NOTE — Op Note (Signed)
Julie [name], Julie [name]             ACCOUNT NO.:  [id number]   MEDICAL RECORD NO.:  [id number]          PATIENT TYPE:  AMB   LOCATION:  NESC                         FACILITY:  [facility]   PHYSICIAN:  Martina Sinner, MD DATE OF BIRTH:  August 30, 1945   DATE OF PROCEDURE:  11/03/2006  DATE OF DISCHARGE:                               OPERATIVE REPORT   PREOPERATIVE DIAGNOSIS:  Mixed stress and urge incontinence.   POSTOPERATIVE DIAGNOSIS:  Mixed stress and urge incontinence.   SURGERY:  Sling, cystourethropexy [facility]) plus cystoscopy.   SURGEON:  Martina Sinner, MD   ASSISTANT:  Terie Purser, MD   INDICATION:  [name] has mixed stress urge incontinence.  She has a  significant outlet abnormality.  She failed preoperative  anticholinergics.  She has had a failed sling in the past.   DESCRIPTION OF PROCEDURE:  The patient was prepped and draped in the  usual fashion.  Extra care was taken in leg positioning to minimize the  risk of compartment syndrome, neuropathy and DVT.   I made two 1-cm incisions 1.5 cm lateral to the midline and 1  fingerbreadth above the symphysis pubis.  I then made a 2=cm incision  overlying the mid urethra.  Care was taken to be in the area of the mid  urethra.  I dissected down after injecting 1% lidocaine/epinephrine  mixture to make certain I had a thick vaginal wall flap.  I bluntly  dissected to the urethrovesical angle bilaterally.   With the bladder empty, I passed the [facility] needle on top of and along  the back of the symphysis pubis, parallel to the midline, under the pulp  of my left index finger bilaterally.  I then cystoscoped the patient.  There was efflux of indigo carmine from both ureteral orifices.  There  was no injury to the urethra.  She had a little bit of mobility at 2  o'clock near the bladder neck when I deflected the needle and this was  probably due to retropubic scarring.  I thought it may be a little bit  close, so I replaced  the needle up approximately a centimeter more  lateral.  I then resectoscope the patient.  Again, there was no  indentation of the bladder or bladder neck.  The urethra was normal.  There was efflux of indigo carmine from both ureteral orifices.   With the bladder empty, we attached the sling and brought it up through  the retropubic space.  I tensioned over the fat part of a medium-sized  Kelly, cut below the blue dots, irrigated the sheath and removed the  sheaths.  I was very happy with the position and tension of the sling.  There was hypermobility in the midline and laterally.  It laid in nice  and flat.  Irrigation was used with antibiotics.   2-0 Vicryl was used in a running fashion for the vaginal incision with 2  supporting sutures.  4-0 Vicryl was used for the skin.   Bleeding was minimal to less than 30 mL of blood loss.  A vaginal pack  was inserted.  Her Foley catheter draining well at the end of the case,  but then plugged for her voiding trial.  Dermabond was used for the skin  incisions.   Hopefully, this operation will reach the patient's treatment goal.           ______________________________  Martina Sinner, MD  Electronically Signed     SAM/MEDQ  D:  11/03/2006  T:  11/03/2006  Job:  [id number]

## 2010-11-29 NOTE — H&P (Signed)
NAMEVIVIENE, THURSTON             ACCOUNT NO.:  [id number]   MEDICAL RECORD NO.:  [id number]          PATIENT TYPE:  EMS   LOCATION:  ED                           [facility]ACILITY[facility]   PHYSICIAN:  Angelia Mould. Derrell Lolling, M.D.DATE OF BIRTH:  May 30, 1946   DATE OF ADMISSION:  10/10/2004  DATE OF DISCHARGE:                                HISTORY & PHYSICAL   CHIEF COMPLAINT:  Abdominal pain, nausea, and vomiting.   HISTORY OF PRESENT ILLNESS:  This is a 65 year old white female who gives a  five years' history of intermittent episodes of epigastric and right upper  quadrant pain, nausea, and vomiting.  She has been worked up for gallstones  in the past but that workup has been negative.  At 1 a.m., this morning, she  awoke with a severe attack of pain associated with nausea and vomiting but  no fever, no diarrhea.  She came to the emergency room.  The pain is much  better now since she received pain medication but is still having a little  bit of discomfort.  An ultrasound has shown numerous gallstones but no wall  thickening and the common bile duct is normal caliber.  Sheppard Penton. Stacie Acres,  M.D. asked me to see her.   PAST HISTORY:  1.  She has had two bladder suspensions.  2.  Bilateral breast biopsies.  3.  Vaginal hysterectomy.  4.  Two pregnancies and two normal deliveries.  5.  Fracture of her right ankle with pins in place.  6.  Left knee arthroscopy.  7.  Equilibrium problems without specific diagnosis.  8.  Chronic anxiety.   She denies diabetes, heart disease, cancer or pulmonary disease.   CURRENT MEDICATIONS:  1.  Wellbutrin 300 mg a day.  2.  Protonix 40 mg a day.  3.  Multivitamins.  4.  Lexapro 10 mg a day.  5.  Xanax 1 mg a day.  6.  Vitamin C.  7.  Aspirin 81 mg a day.   ALLERGIES:  PERCOCET causes itching.   FAMILY HISTORY:  Her mother is living, has diabetes, asthma.  She has had a  cholecystectomy.  Her father is deceased.  He had a cholecystectomy,  coronary  artery disease, carotid endarterectomy but died of leukemia.   SOCIAL HISTORY:  She is married and she has two children.  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  She denies the use of tobacco.  She drinks alcohol rarely.   REVIEW OF SYSTEMS:  All systems are reviewed.  They are noncontributory  except as described above.   PHYSICAL EXAMINATION:  A pleasant, middle-aged woman in no obvious distress.  She is a little bit sedated but able to answer questions appropriately and  her husband is with her throughout the encounter.  Temperature 97.6, pulse  72, respirations 20, blood pressure 128/88, oxygen saturation 100% on room  air.  Eyes:  Sclerae clear.  Extraocular movements intact.  Ears, nose,  mouth, and throat:  Nose, lips, tongue, and oropharynx are without gross  lesions.  Neck is supple, non-tender without mass.  No jugular venous  distention.  Lungs are clear to auscultation.  No chest wall tenderness.  Heart:  Regular rate and rhythm.  No murmur.  Radial, femoral, and dorsalis  pedis pulses are palpable.  No peripheral edema.  Breasts not examined.  Abdomen is soft.  Minimal if any subjective right upper quadrant tenderness.  Certainly, no guarding.  No mass.  No rebound.  No distention.  Liver and  spleen not enlarged.  No hernias.  Extremities:  She moves all four  extremities well without pain or deformity.  Neurologic:  No gross motor or  sensory deficits.   ADMISSION DATA:  Ultrasound as described above with gallstones but no  obvious inflammatory changes.  Urinalysis unremarkable.  A complete  metabolic panel notable for an SGOT of 87, SGPT of 68, and a total bilirubin  of 1.1.  White blood cell count 11,500 with hemoglobin 13.5.  Lipase is 23.   ASSESSMENT:  1.  Acute cholecystitis with cholelithiasis with recurrent biliary colic.  2.  Chronic anxiety.  3.  Status post vaginal hysterectomy.  4.  Status post bladder suspension x2.   PLAN:  The  patient will be admitted and will undergo laparoscopic  cholecystectomy with cholangiogram within the next 24 hours.   I have discussed the indication and details of surgery with the patient and  her husband.  The risks and complications have been outlined, including but  not limited to bleeding, infection, conversion to open laparotomy, injury to  adjacent organs such as the main bile duct or intestine with major  reconstructive surgery, wound problems such as infection or hernia, cardiac,  pulmonary, and thromboembolic problems.  She seems to understand these  issues well.  At this time, all of her questions were answered.  She is in  full agreement with this plan.      HMI/MEDQ  D:  10/10/2004  T:  10/10/2004  Job:  [id number]   cc:   Ellin Saba., M.D.  [address] [place]  [place] [address]  Fax: [phone]

## 2010-11-29 NOTE — Discharge Summary (Signed)
NAMESKARLETH, DELMONICO             ACCOUNT NO.:  [id number]   MEDICAL RECORD NO.:  [id number]          PATIENT TYPE:  INP   LOCATION:  0457                         FACILITY:  [facility]   PHYSICIAN:  Angelia Mould. Derrell Lolling, M.D.DATE OF BIRTH:  05-13-1946   DATE OF ADMISSION:  10/10/2004  DATE OF DISCHARGE:  10/13/2004                                 DISCHARGE SUMMARY   FINAL DIAGNOSES:  1.  Acute and chronic cholecystitis with cholelithiasis.  2.  Choledocholithiasis.   OPERATION PERFORMED:  1.  Laparoscopic cholecystectomy with intraoperative cholangiogram, date      October 10, 2004.  2.  ERCP with sphincterotomy and common bile duct stone removal, date October 11, 2004.   HISTORY:  This is a 65 year old white female who gives a five year history  of intermittent episodes of epigastric and right upper quadrant pain, nausea  and vomiting. Workup in the past has been unrevealing. On the day of  admission, she awoke with severe pain, nausea and vomiting but no fever, no  diarrhea. She came to the emergency room, received pain medication and was  feeling better and ultrasound showed numerous gallstones but no wall  thickening and the common bile duct was not dilated. I was called by Dr.  Mariel Aloe and admitted the patient for further evaluation and management.   PHYSICAL EXAMINATION:  GENERAL:  A pleasant middle-aged woman in no  distress, slightly sedated.  VITAL SIGNS:  Temperature 97.6, pulse 72, blood pressure 128/88.  NECK:  Supple, no mass, no JVD.  HEART:  Regular rate and rhythm, no murmur.  LUNGS:  Clear to auscultation.  ABDOMEN:  Soft, minimal if any subjective right upper quadrant tenderness,  no guarding, no mass, no rebound, no distention, liver and spleen not  enlarged.   ADMISSION DATA:  Ultrasound shows gallstones but no obvious inflammatory  change. Urinalysis unremarkable. Lab work showed a SGOT of 87, SGPT of 68,  total bilirubin of 1.1, white count of 11,500,  lipase 23.   HOSPITAL COURSE:  The patient was admitted, started on intravenous  antibiotics. She was taken to the operating room the same day. Gross  findings were consistent with acute cholecystitis. A Laparoscopic  cholecystectomy with cholangiogram was performed. The cholangiogram showed a  normal biliary tree, no dilatation, no filling defect but the contrast did  not go into the duodenum in spite the use of Glucagon and repeated attempts.  The surgery was completed.   Postoperatively, the patient felt well. She was observed overnight. The  following morning she felt well and wanted to go home. She was advised to  have a repeat set of liver function tests performed and the followup liver  function tests were elevated with a total bilirubin if 3.5, SGOT 214, white  blood cell count was 9000. The patient was informed that she might have  common bile duct stones. Dr. Lina Sar saw the patient and performed ERCP  on October 11, 2004 and that revealed a 3 mm common bile duct stone which was  extracted and a sphincterotomy was performed and she did  well. The patient  was observed overnight and she did well. The liver function tests were  improved on April 2 with a bilirubin down to 3.8, AST down to 74, ALT down  to 132. Alkaline phosphatase remained normal.   She was discharged on October 13, 2004 feeling well and with improving lab  work. She was asked to return to see me in the office in three weeks to get  a prescription for Vicodin for pain.      HMI/MEDQ  D:  10/31/2004  T:  10/31/2004  Job:  [id number]   cc:   Lina Sar, M.D. [facility]   Ellin Saba., M.D.  [address] [place]  [place] [address]  Fax: [phone]

## 2010-11-29 NOTE — Op Note (Signed)
NAMETRESEA, HEINE NO.:  [id number]   MEDICAL RECORD NO.:  [id number]          PATIENT TYPE:  INP   LOCATION:  0457                         [facility]ACILITY:  [facility]   PHYSICIAN:  Lina Sar, M.D. [facility]  DATE OF BIRTH:  Mar 22, 1946   DATE OF PROCEDURE:  10/11/2004  DATE OF DISCHARGE:                                 OPERATIVE REPORT   PROCEDURE:  ERCP with sphincterotomy.   INDICATIONS:  This 65 year old white female was admitted with abdominal  pain, abnormal liver function tests, and ultrasound showing thickened  gallbladder wall with multiple gallstones.  Her common bile duct was normal.  She underwent laparoscopic cholecystectomy last night with an intraoperative  cholangiogram showing failure of the contrast to pass into the duodenum.  The size of the common bile duct was normal and though no obvious stones,  but this morning, the patient had continued abdominal pain and increase in  her bilirubin to 3.5.  She is undergoing ERCP to assess her common bile  duct, to sweep it, and decide if sphincterotomy is indicated and to possibly  remove any retained stones.   ENDOSCOPE:  Olympus single-channel side-viewing duodenoscope.   SEDATION:  1.  Versed 12.5 mg IV.  2.  Fentanyl 137.5 mcg IV.  3.  Glucagon 0.25 mg IV.   FINDINGS:  Olympus single-channel side-viewing duodenoscope passed blindly  through the posterior pharynx into the esophagus of the stomach, pylorus,  into the duodenum.  Papilla was visualized without difficulty and appeared  normal.  No bile was exiting from the papilla.  It was cannulated without  difficulty, and common bile duct was visualized which showed diameter of  about 8 mm.  There were surgical clips at the cystic duct remnant.  Intrahepatic ducts were normal.  Fluoroscopy of the common bile duct did not  show any retained stone initially.  Main pancreatic duct was selectively  avoided.  Guidewire was placed and over the guidewire, a  sphincterotomy was  carried out which was small to moderate size; 8.5 mm Wilson-Cook balloon was  placed over the guidewire into the common bile duct, inflated, and several  passes of the common bile duct were made with the recovery of 1 solitary  stone which measured 3 mm, and it was delivered into the duodenum with the  balloon.  Additional sweep did not show any additional stones.  Postocclusion cholangiogram appeared normal.  The patient tolerated the  procedure well.   IMPRESSION:  1.  Retained common bile duct stone.  2.  Status post endoscopic sphincterotomy and sweep over the bile duct with      recovery of a stone.  3.  Previous laparoscopic cholecystectomy.   PLAN:  The patient will be observed tonight.  She will have repeat liver  function tests, amylase, and lipase in the morning.  Advance diet in the  morning as tolerated.  Unfortunately, the endoscopic photograph equipment  did not function properly, and the photographs of the stones which were  taken during the ERCP were not recovered after the procedure.      DB/MEDQ  D:  10/11/2004  T:  10/11/2004  Job:  [id number]   cc:   Angelia Mould. Derrell Lolling, M.D.  [address], [address]  [place]  [place] [address]

## 2010-11-29 NOTE — Op Note (Signed)
NAMEDELORAS, REICHARD             ACCOUNT NO.:  [id number]   MEDICAL RECORD NO.:  [id number]          PATIENT TYPE:  INP   LOCATION:  0457                         FACILITY:  [facility]   PHYSICIAN:  Angelia Mould. Derrell Lolling, M.D.DATE OF BIRTH:  Nov 15, 1945   DATE OF PROCEDURE:  10/10/2004  DATE OF DISCHARGE:                                 OPERATIVE REPORT   PREOPERATIVE DIAGNOSIS:  Acute cholecystitis with cholelithiasis.   POSTOPERATIVE DIAGNOSIS:  Acute cholecystitis with cholelithiasis.   OPERATION PERFORMED:  Laparoscopic cholecystectomy with intraoperative  cholangiogram.   SURGEON:  Dr. Claud Kelp   FIRST ASSISTANT:  None.   OPERATIVE INDICATIONS:  This is a 65 year old white female with a 5-year  history of intermittent episodes of epigastric and right upper quadrant  pain, nausea, and vomiting.  She had a severe attack that began at 1 a.m.  today, and she came to the emergency room.  She was evaluated by the  emergency department physician.  An ultrasound shows gallstones but no wall  thickening.  Common bile duct measured 4.1 mm.  Her liver function testing  revealed an SGOT of 87, SGPT of 68, but her alkaline phosphatase and total  bilirubin were normal.  Lipase was 23.  She is brought to operating room  urgently for a cholecystectomy.   OPERATIVE FINDINGS:  The gallbladder was edematous but was not exudative and  was not gangrenous.  The anatomy of the cystic duct, cystic artery, and  common bile duct were conventional.  The cholangiogram showed a normal  pattern of intrahepatic and extrahepatic biliary tree, no dilatation, and no  filling defect and a normal smooth tapering of the distal common bile duct,  but very little contrast went into the duodenum.  This was despite giving  some Glucagon and taking another cholangiogram.  The stomach, duodenum,  small intestine, large intestine, and peritoneal surfaces otherwise looked  normal.   OPERATIVE TECHNIQUE:  Following  the induction of general endotracheal  anesthesia, the patient's abdomen was prepped and draped in a sterile  fashion.  Marcaine 0.5% with epinephrine was used as a local infiltration  anesthetic.  A transverse incision was made at the upper rim of the  umbilicus.  The fascia was incised in the midline and the abdominal cavity  entered under direct vision.  A 10 mm Hassan trocar was inserted and secured  with the pursestring suture of 0 Vicryl.  Pneumoperitoneum was created.  Video cam was inserted with visualization and findings as described above.  A 10 mm trocar was placed in the subxiphoid region and two 5 mm trocars  placed in the right mid abdomen.  The gallbladder fundus was elevated.  The  infundibulum was identified and retracted laterally.  I dissected out the  cystic duct and the cystic artery.  The cystic artery was isolated as it  went onto the wall of the gallbladder, secured with multiple metal clips,  and divided.  We actually found a posterior branch as well, and we isolated  it as well, going under the wall of the gallbladder, secured it with  multiple metal clips, and  divided it.  This created a nice window behind the  cystic duct.  A metal clip was placed in the cystic duct as it entered the  gallbladder.  A cholangiogram catheter was inserted into the cystic duct.  A  cholangiogram was obtained using the C-arm.   As described above, the cholangiogram showed normal biliary anatomy, normal  right and left hepatic systems, normal common bile duct, no filling defect,  no dilatation, and smooth tapering of the distal common bile duct but  despite giving glucagon, only a small trickle went into the duodenum.  I  felt that the patient did not have clinically obstructive common bile duct,  and I felt this was most likely due to spasm due to acute inflammation and  edema and felt that it would be best to simply conclude the operation and  follow the liver function tests  closely.   Cholangiogram catheter was removed.  The cystic duct was secured with the  multiple metal clips and divided.  The gallbladder was dissected from its  bed with electrocautery and removed through the umbilical port.   The operative field was copiously irrigated with saline.  Hemostasis in the  bed of the gallbladder was excellent and achieved with electrocautery.  At  the completion of the case, there was no bleeding and no bile leak  whatsoever, and the irrigation fluid was clear.   The trocars were removed under direct vision.  There was no bleeding from  the trocar sites.  The pneumoperitoneum was released.  The fascia at the  umbilicus was closed with 0 Vicryl sutures.  The skin incisions were closed  with subcuticular sutures of 4-0 Monocryl and a few interrupted sutures of 3-  0 nylon because of oozing from the skin edges.  Clean bandages were placed.  The patient tolerated the procedure well and was taken to the recovery room  in stable condition.  Estimated blood loss was about 20 mL.  Complications  were none.  Sponge, needle, and instrument counts were correct.      HMI/MEDQ  D:  10/10/2004  T:  10/10/2004  Job:  [id number]   cc:   Ellin Saba., M.D.  [address] [place]  [place] [address]  Fax: [phone]

## 2010-11-29 NOTE — Op Note (Signed)
[facility]  Patient:    Julie [name], Julie [name]                      MRN: [id number] Proc. Date: 12/31/99 Adm. Date:  [id number] Disc. Date: [id number] Attending:  Evlyn Clines                           Operative Report  PREOPERATIVE DIAGNOSIS:       Stress incontinence.  POSTOPERATIVE DIAGNOSIS:      Stress incontinence.  OPERATION/PROCEDURE:          Pubovaginal sling with dermatrix.  ANESTHESIA:                   General.  SURGEON:                      Excell Seltzer. Annabell Howells, M.D.  ASSISTANT:  DRAINS:                       Suprapubic tube.  COMPLICATIONS:                None.  INDICATIONS:  The patient is a 65 year old white female with stress incontinence who is to undergo sling.  DESCRIPTION OF PROCEDURE: She was given p.o. Cipro and taken to the operating room where general anesthesia was induced. Her mons was shaved. She was placed in the high lithotomy position. She was prepped with Betadine solution and draped in the usual sterile fashion. A Foley catheter was inserted and the bladder was drained. A weighted vaginal retractor was placed. The anterior vaginal wall was infiltrated with 1% lidocaine with epinephrine, 6 cc was used. A transverse incision was made over the pubis, approximately 3 cm in length. The fat was spread to the fascia and moist antibiotic solution soaked sponge was placed in the wound. An anterior vaginal incision was made over the bladder neck and proximal urethra. The mucosa was elevated off the pubourethral fascia out to its attachments of the pubis on each side. The fascia was then entered initially on the right with the tip of the scissors, allowing passage of the finger to the retropubic space. This was then repeated on the left. Once the track had been completed a 2 x 8 cm dermatrix dermis strip was prepared by soaking in antibiotic solution. A #1 Prolene was placed in each end with a quadruple helical stitch. The Raz  needle was then used to bring the suspension sutures into the abdominal incision. One pass was made on each side using guidance. The dermatrix strip was then secured over the bladder neck and urethra with 2-0 Vicryl tacking sutures and the sling was seated. The anterior vaginal wall mucosa was then closed with a running locked 2-0 chromic. The Foley was removed. The cystoscope sheath was inserted. The bladder was drained. Cystoscopy was performed. The sling was noted to be in good position. There was no evidence of injury to the bladder wall. The bladder was left full and patient was placed in the Trendelenburg position and a microevasive Fader tip suprapubic tube was placed through a separate stab wound superior to the abdominal incision under cystoscopic guidance. Once positioned, the trocar was removed and the suprapubic tube was placed to straight drainage. At this point, the bladder was drained. The cystoscope sheath was left in position to hold the urethra in the neutral position while the suspension sutures were  tied. First each one was tied and then they were tied across the midline to each other. The knot was tucked back into the abdominal incision. The abdominal incision was irrigated with antibiotic solution and closed with running intercuticular 4-0 Vicryl stitch. The suprapubic tube was secured with a 2-0 silk suture. A 2 inch Iodoform vaginal pack was placed. Steri-Strips were placed on the wound and a dressing was applied. The patient was taken down from the lithotomy position. Her anesthetic was reversed. She was removed to the recovery room in stable condition. There were no complications. DD:  12/31/99 TD:  01/02/00 Job: [id number]

## 2010-12-26 ENCOUNTER — Other Ambulatory Visit (INDEPENDENT_AMBULATORY_CARE_PROVIDER_SITE_OTHER): Payer: PRIVATE HEALTH INSURANCE

## 2010-12-26 DIAGNOSIS — Z0000 Encounter for general adult medical examination without abnormal findings: Secondary | ICD-10-CM

## 2010-12-26 LAB — POCT URINALYSIS DIPSTICK
Nitrite, UA: NEGATIVE
Urobilinogen, UA: 0.2

## 2010-12-26 LAB — LIPID PANEL
HDL: 45.8 mg/dL (ref >39.00)
LDL Cholesterol: 62 mg/dL (ref 0–99)
Total CHOL/HDL Ratio: 3
Triglycerides: 147 mg/dL (ref 0.0–149.0)

## 2010-12-26 LAB — BASIC METABOLIC PANEL
CO2: 27 mEq/L (ref 19–32)
Chloride: 109 mEq/L (ref 96–112)
GFR: 64.38 mL/min (ref >60.00)
Glucose, Bld: 104 mg/dL — ABNORMAL HIGH (ref 70–99)

## 2010-12-26 LAB — HEPATIC FUNCTION PANEL: Bilirubin, Direct: 0.1 mg/dL (ref 0.0–0.3)

## 2010-12-26 LAB — CBC WITH DIFFERENTIAL/PLATELET
Basophils Absolute: 0 10*3/uL (ref 0.0–0.1)
Eosinophils Absolute: 0.2 10*3/uL (ref 0.0–0.7)
Hemoglobin: 13.5 g/dL (ref 12.0–15.0)
Lymphs Abs: 2.3 10*3/uL (ref 0.7–4.0)
Monocytes Absolute: 0.4 10*3/uL (ref 0.1–1.0)
Neutrophils Relative %: 67.3 % (ref 43.0–77.0)
RBC: 4.52 Mil/uL (ref 3.87–5.11)
WBC: 9.2 10*3/uL (ref 4.5–10.5)

## 2010-12-26 LAB — TSH: TSH: 2.53 u[IU]/mL (ref 0.35–5.50)

## 2011-01-07 ENCOUNTER — Ambulatory Visit (INDEPENDENT_AMBULATORY_CARE_PROVIDER_SITE_OTHER): Payer: PRIVATE HEALTH INSURANCE | Admitting: Family Medicine

## 2011-01-07 ENCOUNTER — Encounter: Payer: Self-pay | Admitting: Family Medicine

## 2011-01-07 VITALS — BP 120/88 | HR 69 | Temp 98.6°F | Ht 68.0 in | Wt 199.0 lb

## 2011-01-07 DIAGNOSIS — E6609 Other obesity due to excess calories: Secondary | ICD-10-CM

## 2011-01-07 DIAGNOSIS — Z0000 Encounter for general adult medical examination without abnormal findings: Secondary | ICD-10-CM

## 2011-01-07 DIAGNOSIS — F329 Major depressive disorder, single episode, unspecified: Secondary | ICD-10-CM

## 2011-01-07 DIAGNOSIS — E669 Obesity, unspecified: Secondary | ICD-10-CM

## 2011-01-07 DIAGNOSIS — E785 Hyperlipidemia, unspecified: Secondary | ICD-10-CM

## 2011-01-07 DIAGNOSIS — F341 Dysthymic disorder: Secondary | ICD-10-CM

## 2011-01-07 DIAGNOSIS — M1990 Unspecified osteoarthritis, unspecified site: Secondary | ICD-10-CM

## 2011-01-09 ENCOUNTER — Other Ambulatory Visit: Payer: Self-pay | Admitting: Family Medicine

## 2011-01-09 MED ORDER — ATORVASTATIN CALCIUM 20 MG PO TABS: 20.0000 mg | ORAL_TABLET | Freq: Every day | ORAL | Status: DC

## 2011-01-09 MED ORDER — CELECOXIB 200 MG PO CAPS: 200.0000 mg | ORAL_CAPSULE | Freq: Two times a day (BID) | ORAL | Status: DC

## 2011-01-09 MED ORDER — BUPROPION HCL ER (XL) 300 MG PO TB24: 300.0000 mg | ORAL_TABLET | Freq: Every day | ORAL | Status: DC

## 2011-01-09 MED ORDER — FEXOFENADINE HCL 180 MG PO TABS: 180.0000 mg | ORAL_TABLET | Freq: Every day | ORAL | Status: DC

## 2011-01-09 NOTE — Telephone Encounter (Signed)
Was seen on Tuesday. Checking on status of her refills: Wellbutrin, Allegra, Xanax, Celebrex, Lipitor sent to [facility].

## 2011-01-10 MED ORDER — ALPRAZOLAM 1 MG PO TABS: ORAL_TABLET | ORAL | Status: DC

## 2011-01-10 NOTE — Telephone Encounter (Signed)
rx have been called in and pharmacy called to verify and pt has picked up her rx

## 2011-01-21 ENCOUNTER — Other Ambulatory Visit: Payer: Self-pay | Admitting: Dermatology

## 2011-02-03 ENCOUNTER — Encounter: Payer: Self-pay | Admitting: Family Medicine

## 2011-02-03 NOTE — Patient Instructions (Signed)
In general that I feel you're doing very well but would recommend that you resume the weight reduction diet Continue the regular medications prescribed for anxiety depression and arthritis and hyperlipidemia Haven't refilled her medications Laboratory studies revealed  good results as we discussed

## 2011-02-03 NOTE — Progress Notes (Signed)
  Subjective:    Patient ID: Julie [name], female    DOB: 1946-02-07, 65 y.o.   MRN: [id number] This 65 year old white married female is in for a physical examination except for pelvic and Pap which was done by Dr. Rana Snare could she is in to discuss her medical problems and who is in to renew her prescriptions She continues to have probable anxiety depression and recently in the past few months had right ankle replaced  At [facility], breast and pelvic examination done by Dr. Rana Snare and was negative including Pap smear r HPI Has arthritis of multiple joints and takes Celebrex 200 mg once or twice daily   Review of Systems see history of present illness     Objective:   Physical Exam the patient is a well-nourished white female who is in no distress but does complain of some discomfort  of the right ankle when walkinfg HEENT nasal mucosa boggy and pale carotid pulses good thyroid is nonpalpable\ Lungs are clear to palpation percussion and auscultation no rales no wheezing Breasts no masses palpable Heart examination is no indication of cardiomegaly and rate and rhythm regular peripheral pulsations are good and equal bilaterally Abdomen liver spleen kidney nonpalpable no masses felt no tenderness bowel sounds normal Pelvic and rectal not done. Done by gynecologist Examination of extremities revealed operative scars this  and some tenderness in the right ankle Skin no positive findings        Assessment & Plan:  Exogenous obesity recommend initiation of weight loss diet Anxiety and depression continue Wellbutrin and Xanax Osteoarthritis continue Celebrex Hyperlipidemia continue Lipitor Continue Aspirin 81 mg qd

## 2011-02-19 ENCOUNTER — Other Ambulatory Visit: Payer: Self-pay | Admitting: Dermatology

## 2011-02-20 ENCOUNTER — Other Ambulatory Visit: Payer: Self-pay | Admitting: Family Medicine

## 2011-05-23 ENCOUNTER — Other Ambulatory Visit: Payer: Self-pay | Admitting: *Deleted

## 2011-05-23 MED ORDER — ALPRAZOLAM 1 MG PO TABS: ORAL_TABLET | ORAL | Status: DC

## 2011-06-30 ENCOUNTER — Ambulatory Visit (INDEPENDENT_AMBULATORY_CARE_PROVIDER_SITE_OTHER): Payer: Medicare Other | Admitting: Internal Medicine

## 2011-06-30 ENCOUNTER — Encounter: Payer: Self-pay | Admitting: Internal Medicine

## 2011-06-30 VITALS — BP 110/72 | HR 64 | Temp 98.0°F | Ht 68.0 in | Wt 193.4 lb

## 2011-06-30 DIAGNOSIS — M12579 Traumatic arthropathy, unspecified ankle and foot: Secondary | ICD-10-CM

## 2011-06-30 DIAGNOSIS — E785 Hyperlipidemia, unspecified: Secondary | ICD-10-CM

## 2011-06-30 DIAGNOSIS — Z23 Encounter for immunization: Secondary | ICD-10-CM

## 2011-06-30 DIAGNOSIS — F341 Dysthymic disorder: Secondary | ICD-10-CM

## 2011-06-30 DIAGNOSIS — F4321 Adjustment disorder with depressed mood: Secondary | ICD-10-CM

## 2011-06-30 MED ORDER — ALPRAZOLAM 1 MG PO TABS: ORAL_TABLET | ORAL | Status: DC

## 2011-06-30 NOTE — Progress Notes (Signed)
Subjective:    Patient ID: Julie [name], female    DOB: November 10, 1945, 65 y.o.   MRN: [id number]  HPI  New patient to me, but known to our practice. Previously followed with Dr. Scotty Court, here today to establish care Reviewed chronic medical issues today:   Anxiety/depression. Acute exacerbation of symptoms 10-09-12related to unexpected death of spouse. Taking Xanax chronically for daily for years, no increased use despite increased stress. Also helping daughter to care for triplets born October 2012. Travels to [facility] Monday through Thursday to help provide care for same. Denies SI/HI, feels support system intact via church and family  Arthritis. Diffuse but especially in right ankle status post ankle replacement. Uses over-the-counter anti-inflammatories as needed, no specific daily pain medication. No swelling or acute flares  Dyslipidemia. On atorvastatin since 2008. Largely prescribed do to abnormalities in the face of strong family history heart disease and dyslipidemia. Reports compliance with medication as prescribed denies adverse side effects related to treatment  Past Medical History  Diagnosis Date  . Arthritis   . Allergy   . Anxiety   . Depression   . GERD (gastroesophageal reflux disease)      Review of Systems  Respiratory: Negative for cough and shortness of breath.   Cardiovascular: Negative for chest pain and palpitations.  Psychiatric/Behavioral: Negative for hallucinations, confusion and self-injury. The patient is not hyperactive.        Objective:   Physical Exam BP 110/72  Pulse 64  Temp(Src) 98 F (36.7 C) (Oral)  Ht 5\' 8"  (1.727 m)  Wt 193 lb 6.4 oz (87.726 kg)  BMI 29.41 kg/m2  SpO2 97% Wt Readings from Last 3 Encounters:  06/30/11 193 lb 6.4 oz (87.726 kg)  01/07/11 199 lb (90.266 kg)  08/29/10 196 lb (88.905 kg)   Constitutional: She appears well-developed and well-nourished. No distress.  Neck: Normal range of motion. Neck  supple. No JVD present. No thyromegaly present.  Cardiovascular: Normal rate, regular rhythm and normal heart sounds.  No murmur heard. No BLE edema. Pulmonary/Chest: Effort normal and breath sounds normal. No respiratory distress. She has no wheezes.  Musculoskeletal: Chronic enlargement of right ankle related to joint replacement. Otherwise normal range of motion, no joint effusions. No gross deformities Neurological: She is alert and oriented to person, place, and time. No cranial nerve deficit. Coordination normal.  Psychiatric: She has a normal mood and affect. Her behavior is normal. Judgment and thought content normal.   Lab Results  Component Value Date   WBC 9.2 12/26/2010   HGB 13.5 12/26/2010   HCT 39.9 12/26/2010   PLT 248.0 12/26/2010   GLUCOSE 104* 12/26/2010   CHOL 137 12/26/2010   TRIG 147.0 12/26/2010   HDL 45.80 12/26/2010   LDLDIRECT 130.6 12/18/2008   LDLCALC 62 12/26/2010   ALT 24 12/26/2010   AST 26 12/26/2010   NA 145 12/26/2010   K 5.3* 12/26/2010   CL 109 12/26/2010   CREATININE 0.9 12/26/2010   BUN 27* 12/26/2010   CO2 27 12/26/2010   TSH 2.53 12/26/2010       Assessment & Plan:  See problem list. Medications and labs reviewed today.  Grief reaction. Precipitated by unexpected death of spouse 2011-04-22. Situation events related to same reviewed in depth today. Patient in appropriate state of grief currently expresses anger at situation. Support offered, encourage counseling. Patient will call if problems or change in symptoms  Time spent with ptatient today 25 minutes, greater than 50% time spent  counseling patient on anxiety, depression, cholesterol and medication review. Also review of prior records

## 2011-06-30 NOTE — Patient Instructions (Signed)
It was good to see you today. We have reviewed your prior records including labs and tests today Pneumonia shot given today You have been given a wriitten prescription for the shingles shot to take to your pharmacy - you can have the shot administered by your pharmacy (wait 4 weeks or more after pneumonia shot done) or bring the medicine to our office and our nurse will give you the shot. Medications reviewed, no changes at this time. Refill on medication(s) as discussed today. Please schedule followup in September for your annual exam and labs, call sooner if problems.

## 2011-06-30 NOTE — Assessment & Plan Note (Signed)
Status post right ankle replacement  Follows with [facility] annually and as needed, no change in treatment recommended

## 2011-06-30 NOTE — Assessment & Plan Note (Signed)
On statin for years, largely due to strong FH CAD and dyslipidemia Last lipids reviewed and at goal The current medical regimen is effective;  continue present plan and medications.

## 2011-06-30 NOTE — Assessment & Plan Note (Signed)
Strong component of anxiety, on chronic benzodiazepines for years. Reviewed acute grief component related to unexpected death of spouse 2012-09-15as above Denies SI/HI Counseling and sympathy provided for ongoing stressors -  encouraged consideration of formal counseling and patient will consider to do some spring 2013 with her church Refills provided today on chronic Xanax, no increase use reviewed

## 2011-07-21 DIAGNOSIS — M19079 Primary osteoarthritis, unspecified ankle and foot: Secondary | ICD-10-CM | POA: Diagnosis not present

## 2011-07-21 DIAGNOSIS — M25579 Pain in unspecified ankle and joints of unspecified foot: Secondary | ICD-10-CM | POA: Diagnosis not present

## 2011-10-30 DIAGNOSIS — Z1231 Encounter for screening mammogram for malignant neoplasm of breast: Secondary | ICD-10-CM | POA: Diagnosis not present

## 2011-10-31 DIAGNOSIS — L909 Atrophic disorder of skin, unspecified: Secondary | ICD-10-CM | POA: Diagnosis not present

## 2011-10-31 DIAGNOSIS — L821 Other seborrheic keratosis: Secondary | ICD-10-CM | POA: Diagnosis not present

## 2011-10-31 DIAGNOSIS — D239 Other benign neoplasm of skin, unspecified: Secondary | ICD-10-CM | POA: Diagnosis not present

## 2011-10-31 DIAGNOSIS — Z85820 Personal history of malignant melanoma of skin: Secondary | ICD-10-CM | POA: Diagnosis not present

## 2011-10-31 DIAGNOSIS — L919 Hypertrophic disorder of the skin, unspecified: Secondary | ICD-10-CM | POA: Diagnosis not present

## 2011-12-02 DIAGNOSIS — H2510 Age-related nuclear cataract, unspecified eye: Secondary | ICD-10-CM | POA: Diagnosis not present

## 2011-12-29 ENCOUNTER — Other Ambulatory Visit: Payer: Self-pay | Admitting: Family Medicine

## 2011-12-29 ENCOUNTER — Other Ambulatory Visit: Payer: Self-pay | Admitting: Internal Medicine

## 2011-12-29 NOTE — Telephone Encounter (Signed)
[facility].. 12/29/11@1 :25pm/LMB

## 2012-01-05 ENCOUNTER — Encounter: Payer: Medicare Other | Admitting: Internal Medicine

## 2012-01-09 DIAGNOSIS — Z124 Encounter for screening for malignant neoplasm of cervix: Secondary | ICD-10-CM | POA: Diagnosis not present

## 2012-02-02 ENCOUNTER — Other Ambulatory Visit: Payer: Self-pay | Admitting: Family Medicine

## 2012-02-03 ENCOUNTER — Ambulatory Visit (INDEPENDENT_AMBULATORY_CARE_PROVIDER_SITE_OTHER): Payer: Medicare Other | Admitting: Internal Medicine

## 2012-02-03 ENCOUNTER — Encounter: Payer: Self-pay | Admitting: Internal Medicine

## 2012-02-03 ENCOUNTER — Other Ambulatory Visit (INDEPENDENT_AMBULATORY_CARE_PROVIDER_SITE_OTHER): Payer: Medicare Other

## 2012-02-03 VITALS — BP 110/72 | HR 75 | Temp 98.2°F | Ht 68.0 in | Wt 182.8 lb

## 2012-02-03 DIAGNOSIS — F341 Dysthymic disorder: Secondary | ICD-10-CM | POA: Diagnosis not present

## 2012-02-03 DIAGNOSIS — Z136 Encounter for screening for cardiovascular disorders: Secondary | ICD-10-CM

## 2012-02-03 DIAGNOSIS — Z0000 Encounter for general adult medical examination without abnormal findings: Secondary | ICD-10-CM

## 2012-02-03 DIAGNOSIS — M810 Age-related osteoporosis without current pathological fracture: Secondary | ICD-10-CM | POA: Diagnosis not present

## 2012-02-03 DIAGNOSIS — E785 Hyperlipidemia, unspecified: Secondary | ICD-10-CM

## 2012-02-03 DIAGNOSIS — R739 Hyperglycemia, unspecified: Secondary | ICD-10-CM

## 2012-02-03 DIAGNOSIS — Z23 Encounter for immunization: Secondary | ICD-10-CM | POA: Diagnosis not present

## 2012-02-03 DIAGNOSIS — Z2911 Encounter for prophylactic immunotherapy for respiratory syncytial virus (RSV): Secondary | ICD-10-CM | POA: Diagnosis not present

## 2012-02-03 DIAGNOSIS — R7309 Other abnormal glucose: Secondary | ICD-10-CM

## 2012-02-03 LAB — BASIC METABOLIC PANEL
CO2: 28 mEq/L (ref 19–32)
Calcium: 9.3 mg/dL (ref 8.4–10.5)
GFR: 57.02 mL/min — ABNORMAL LOW (ref >60.00)
Glucose, Bld: 108 mg/dL — ABNORMAL HIGH (ref 70–99)
Potassium: 4.3 mEq/L (ref 3.5–5.1)
Sodium: 141 mEq/L (ref 135–145)

## 2012-02-03 LAB — LIPID PANEL: Total CHOL/HDL Ratio: 2

## 2012-02-03 NOTE — Assessment & Plan Note (Signed)
Mild increase fasting glc Strong FH DM Check glc now and a1c if elevated No results found for this basename: HGBA1C

## 2012-02-03 NOTE — Assessment & Plan Note (Signed)
Strong component of anxiety, on chronic benzodiazepines for years. Reviewed acute grief component related to unexpected death of spouse Oct 02, 2012as above Denies SI/HI Counseling and sympathy provided for ongoing stressors -  continue formal counseling/support group with her church Refills provided today on chronic Xanax, no increase use reviewed

## 2012-02-03 NOTE — Assessment & Plan Note (Signed)
On statin for years, largely due to strong FH CAD and dyslipidemia Last lipids reviewed and at goal, check annually The current medical regimen is effective;  continue present plan and medications.  

## 2012-02-03 NOTE — Progress Notes (Signed)
Subjective:    Patient ID: Julie [name], female    DOB: [birth year], 66 y.o.   MRN: [id number]  HPI  Here for medicare wellness  Diet: heart healthy  Physical activity: active Depression/mood screen: negative Hearing: intact to whispered voice Visual acuity: grossly normal, performs annual eye exam  ADLs: capable Fall risk: none Home safety: good Cognitive evaluation: intact to orientation, naming, recall and repetition EOL planning: adv directives, full code/ I agree  I have personally reviewed and have noted 1. The patient's medical and social history 2. Their use of alcohol, tobacco or illicit drugs 3. Their current medications and supplements 4. The patient's functional ability including ADL's, fall risks, home safety risks and hearing or visual impairment. 5. Diet and physical activities 6. Evidence for depression or mood disorders   Also reviewed chronic medical issues today:  Anxiety/depression. Acute exacerbation of symptoms 10-10-2012related to unexpected death of spouse. Taking Xanax chronically for daily for years, no increased use despite increased stress. Also helping daughter to care for triplets born October 2012. Travels to [facility] Monday through Thursday to help provide care for same. Denies SI/HI, feels support system intact via church and family  Arthritis. Diffuse but especially in right ankle status post ankle replacement. Uses over-the-counter anti-inflammatories as needed, no specific daily pain medication. No swelling or acute flares  Dyslipidemia. On atorvastatin since 2008. Largely prescribed due to strong family history heart disease and dyslipidemia. Reports compliance with medication as prescribed denies adverse side effects related to treatment  Past Medical History  Diagnosis Date  . Arthritis   . Allergy   . Anxiety   . Depression   . GERD (gastroesophageal reflux disease)    Family History  Problem Relation Age of Onset  .  Hyperlipidemia Father   . Heart attack Brother 47    stents x 2 (47 & 76 yo)  . Coronary artery disease Father     cabg 70yo  . Leukemia Father   . Diabetes Mother   . Hyperlipidemia Mother   . Hypertension Mother   . Diabetes Brother   . Hypertension Father    History  Substance Use Topics  . Smoking status: Never Smoker   . Smokeless tobacco: Never Used  . Alcohol Use: No     Review of Systems  Respiratory: Negative for cough and shortness of breath.   Cardiovascular: Negative for chest pain and palpitations.  Psychiatric/Behavioral: Negative for hallucinations, confusion and self-injury. The patient is not hyperactive.   Gastrointestinal: Negative for abdominal pain, no bowel changes.  Musculoskeletal: Negative for gait problem or joint swelling.  Skin: Negative for rash.  Neurological: Negative for dizziness or headache.  No other specific complaints in a complete review of systems (except as listed in HPI above).      Objective:   Physical Exam  BP 110/72  Pulse 75  Temp 98.2 F (36.8 C) (Oral)  Ht 5\' 8"  (1.727 m)  Wt 182 lb 12.8 oz (82.918 kg)  BMI 27.79 kg/m2  SpO2 96% Wt Readings from Last 3 Encounters:  02/03/12 182 lb 12.8 oz (82.918 kg)  06/30/11 193 lb 6.4 oz (87.726 kg)  01/07/11 199 lb (90.266 kg)   Constitutional: She appears well-developed and well-nourished. No distress.  HENT: NCAT, B TMs clear without wax/effusion, OP clear with good dentition Neck: Normal range of motion. Neck supple. No JVD or LAD present. No thyromegaly present.  Cardiovascular: Normal rate, regular rhythm and normal heart sounds.  No murmur heard.  No BLE edema. Pulmonary/Chest: Effort normal and breath sounds normal. No respiratory distress. She has no wheezes.  Musculoskeletal: Chronic enlargement of right ankle related to joint replacement. Left knee - boggy synovitis - tender to palpation over joint line; FROM and ligamentous function intact. Otherwise normal range of  motion, no joint effusions. Neurological: She is alert and oriented to person, place, and time. No cranial nerve deficit. Coordination normal.  Psychiatric: She has a dysphoric mood and affect. Her behavior is normal. Judgment and thought content normal.   Lab Results  Component Value Date   WBC 9.2 12/26/2010   HGB 13.5 12/26/2010   HCT 39.9 12/26/2010   PLT 248.0 12/26/2010   GLUCOSE 108* 02/03/2012   CHOL 127 02/03/2012   TRIG 110.0 02/03/2012   HDL 60.00 02/03/2012   LDLDIRECT 130.6 12/18/2008   LDLCALC 45 02/03/2012   ALT 24 12/26/2010   AST 26 12/26/2010   NA 141 02/03/2012   K 4.3 02/03/2012   CL 105 02/03/2012   CREATININE 1.0 02/03/2012   BUN 15 02/03/2012   CO2 28 02/03/2012   TSH 2.53 12/26/2010   ECG: sinus @ 72 bpm - low voltage precordial leads (V5 off)    Assessment & Plan:  AWV/v70.0 - Today patient counseled on age appropriate routine health concerns for screening and prevention, each reviewed and up to date or declined. Immunizations reviewed and up to date or declined. Labs/ECG reviewed. Risk factors for depression reviewed and negative. Hearing function and visual acuity are intact. ADLs screened and addressed as needed. Functional ability and level of safety reviewed and appropriate. Education, counseling and referrals performed based on assessed risks today. Patient provided with a copy of personalized plan for preventive services.   Also see problem list. Medications and labs reviewed today.  Grief reaction. Precipitated by unexpected death of spouse 19-Apr-2011. Situation events related to same reviewed in depth today. Patient in appropriate state of grief - continues to express anger at situation. Support offered, encourage ongoing counseling. Patient will call if problems or change in symptoms before next OV

## 2012-02-03 NOTE — Patient Instructions (Signed)
It was good to see you today. Health Maintenance reviewed - Zostavax (shingles) vaccine - all other recommended immunizations and age-appropriate screenings are up-to-date. we'll make referral to [facility] for bone density . Our office will contact you regarding appointment(s) once made. Test(s) ordered today. Your results will be called to you and mailed after review (48-72hours after test completion). If any changes need to be made, you will be notified at that time. Medications reviewed, no changes at this time. Please schedule followup in 6 months, call sooner if problems.

## 2012-02-04 ENCOUNTER — Telehealth: Payer: Self-pay

## 2012-02-04 DIAGNOSIS — R739 Hyperglycemia, unspecified: Secondary | ICD-10-CM

## 2012-02-04 NOTE — Telephone Encounter (Signed)
Noted thanks °

## 2012-02-04 NOTE — Telephone Encounter (Signed)
Per [facility], A1C cannot be added on. Left message on machine for pt to return my call.

## 2012-02-04 NOTE — Telephone Encounter (Signed)
Pt advised and will come for labs 07/26.

## 2012-02-06 ENCOUNTER — Other Ambulatory Visit (INDEPENDENT_AMBULATORY_CARE_PROVIDER_SITE_OTHER): Payer: Medicare Other

## 2012-02-06 ENCOUNTER — Other Ambulatory Visit: Payer: Self-pay | Admitting: *Deleted

## 2012-02-06 DIAGNOSIS — R739 Hyperglycemia, unspecified: Secondary | ICD-10-CM

## 2012-02-06 DIAGNOSIS — R7309 Other abnormal glucose: Secondary | ICD-10-CM | POA: Diagnosis not present

## 2012-02-06 MED ORDER — BUPROPION HCL ER (XL) 300 MG PO TB24: 300.0000 mg | ORAL_TABLET | Freq: Every day | ORAL | Status: DC

## 2012-02-06 NOTE — Telephone Encounter (Signed)
Faxed refill request received from pharmacy for Bupropion Last filled by MD on 01/09/11, #30 x 11 (Dr.Stafford) Last seen on 02/03/12 Follow up in 6 months RX sent.

## 2012-02-18 DIAGNOSIS — Z13820 Encounter for screening for osteoporosis: Secondary | ICD-10-CM | POA: Diagnosis not present

## 2012-02-18 LAB — HM DEXA SCAN

## 2012-02-20 ENCOUNTER — Encounter: Payer: Self-pay | Admitting: Internal Medicine

## 2012-02-20 ENCOUNTER — Telehealth: Payer: Self-pay | Admitting: Internal Medicine

## 2012-02-20 NOTE — Telephone Encounter (Signed)
Notified pt with md response... 02/20/12@3 :51pm/LMB

## 2012-02-20 NOTE — Telephone Encounter (Signed)
Please call pt- normal bone density per DEXA from [facility] - no tx changes recommended

## 2012-02-26 DIAGNOSIS — H2510 Age-related nuclear cataract, unspecified eye: Secondary | ICD-10-CM | POA: Diagnosis not present

## 2012-05-07 DIAGNOSIS — D2360 Other benign neoplasm of skin of unspecified upper limb, including shoulder: Secondary | ICD-10-CM | POA: Diagnosis not present

## 2012-05-07 DIAGNOSIS — L821 Other seborrheic keratosis: Secondary | ICD-10-CM | POA: Diagnosis not present

## 2012-05-07 DIAGNOSIS — D2370 Other benign neoplasm of skin of unspecified lower limb, including hip: Secondary | ICD-10-CM | POA: Diagnosis not present

## 2012-05-07 DIAGNOSIS — Z85820 Personal history of malignant melanoma of skin: Secondary | ICD-10-CM | POA: Diagnosis not present

## 2012-05-07 DIAGNOSIS — L7211 Pilar cyst: Secondary | ICD-10-CM | POA: Diagnosis not present

## 2012-05-07 DIAGNOSIS — D235 Other benign neoplasm of skin of trunk: Secondary | ICD-10-CM | POA: Diagnosis not present

## 2012-05-21 DIAGNOSIS — Z23 Encounter for immunization: Secondary | ICD-10-CM | POA: Diagnosis not present

## 2012-05-24 ENCOUNTER — Encounter: Payer: Self-pay | Admitting: Internal Medicine

## 2012-07-14 ENCOUNTER — Other Ambulatory Visit: Payer: Self-pay | Admitting: Internal Medicine

## 2012-07-15 NOTE — Telephone Encounter (Signed)
[facility].../lmb 

## 2012-07-15 NOTE — Telephone Encounter (Signed)
Faxed script back to 

## 2012-07-28 DIAGNOSIS — Z96669 Presence of unspecified artificial ankle joint: Secondary | ICD-10-CM | POA: Diagnosis not present

## 2012-07-28 DIAGNOSIS — M259 Joint disorder, unspecified: Secondary | ICD-10-CM | POA: Diagnosis not present

## 2012-08-13 ENCOUNTER — Other Ambulatory Visit: Payer: Self-pay | Admitting: Internal Medicine

## 2012-08-23 DIAGNOSIS — H2510 Age-related nuclear cataract, unspecified eye: Secondary | ICD-10-CM | POA: Diagnosis not present

## 2012-09-06 DIAGNOSIS — H269 Unspecified cataract: Secondary | ICD-10-CM | POA: Diagnosis not present

## 2012-09-06 DIAGNOSIS — H2510 Age-related nuclear cataract, unspecified eye: Secondary | ICD-10-CM | POA: Diagnosis not present

## 2012-09-06 DIAGNOSIS — H2589 Other age-related cataract: Secondary | ICD-10-CM | POA: Diagnosis not present

## 2012-09-13 DIAGNOSIS — H2510 Age-related nuclear cataract, unspecified eye: Secondary | ICD-10-CM | POA: Diagnosis not present

## 2012-10-11 DIAGNOSIS — H2589 Other age-related cataract: Secondary | ICD-10-CM | POA: Diagnosis not present

## 2012-10-11 DIAGNOSIS — H269 Unspecified cataract: Secondary | ICD-10-CM | POA: Diagnosis not present

## 2012-10-11 DIAGNOSIS — H2510 Age-related nuclear cataract, unspecified eye: Secondary | ICD-10-CM | POA: Diagnosis not present

## 2012-10-18 DIAGNOSIS — M7750 Other enthesopathy of unspecified foot: Secondary | ICD-10-CM | POA: Diagnosis not present

## 2012-10-18 DIAGNOSIS — M25579 Pain in unspecified ankle and joints of unspecified foot: Secondary | ICD-10-CM | POA: Diagnosis not present

## 2012-10-19 DIAGNOSIS — M24876 Other specific joint derangements of unspecified foot, not elsewhere classified: Secondary | ICD-10-CM | POA: Diagnosis not present

## 2012-10-19 DIAGNOSIS — G8918 Other acute postprocedural pain: Secondary | ICD-10-CM | POA: Diagnosis not present

## 2012-10-19 DIAGNOSIS — M79609 Pain in unspecified limb: Secondary | ICD-10-CM | POA: Diagnosis not present

## 2012-10-19 DIAGNOSIS — M24873 Other specific joint derangements of unspecified ankle, not elsewhere classified: Secondary | ICD-10-CM | POA: Diagnosis not present

## 2012-10-19 HISTORY — PX: ANKLE ARTHROSCOPY: SHX545

## 2012-12-15 DIAGNOSIS — M259 Joint disorder, unspecified: Secondary | ICD-10-CM | POA: Diagnosis not present

## 2012-12-15 DIAGNOSIS — Z96669 Presence of unspecified artificial ankle joint: Secondary | ICD-10-CM | POA: Diagnosis not present

## 2012-12-22 DIAGNOSIS — H43819 Vitreous degeneration, unspecified eye: Secondary | ICD-10-CM | POA: Diagnosis not present

## 2013-01-03 ENCOUNTER — Other Ambulatory Visit: Payer: Self-pay | Admitting: *Deleted

## 2013-01-03 MED ORDER — ATORVASTATIN CALCIUM 20 MG PO TABS: 20.0000 mg | ORAL_TABLET | Freq: Every day | ORAL | Status: DC

## 2013-01-03 MED ORDER — FEXOFENADINE HCL 180 MG PO TABS: 180.0000 mg | ORAL_TABLET | Freq: Every day | ORAL | Status: DC

## 2013-01-18 DIAGNOSIS — Z124 Encounter for screening for malignant neoplasm of cervix: Secondary | ICD-10-CM | POA: Diagnosis not present

## 2013-02-05 ENCOUNTER — Other Ambulatory Visit: Payer: Self-pay | Admitting: Internal Medicine

## 2013-02-07 NOTE — Telephone Encounter (Signed)
Faxed [facility] to [facility],,,/lmb

## 2013-02-09 ENCOUNTER — Encounter: Payer: Self-pay | Admitting: Internal Medicine

## 2013-02-09 ENCOUNTER — Ambulatory Visit (INDEPENDENT_AMBULATORY_CARE_PROVIDER_SITE_OTHER): Payer: Medicare Other | Admitting: Internal Medicine

## 2013-02-09 VITALS — BP 122/84 | HR 61 | Temp 98.5°F | Ht 68.0 in | Wt 188.0 lb

## 2013-02-09 DIAGNOSIS — Z0000 Encounter for general adult medical examination without abnormal findings: Secondary | ICD-10-CM | POA: Diagnosis not present

## 2013-02-09 DIAGNOSIS — R5383 Other fatigue: Secondary | ICD-10-CM

## 2013-02-09 DIAGNOSIS — Z136 Encounter for screening for cardiovascular disorders: Secondary | ICD-10-CM

## 2013-02-09 DIAGNOSIS — R7309 Other abnormal glucose: Secondary | ICD-10-CM

## 2013-02-09 DIAGNOSIS — E785 Hyperlipidemia, unspecified: Secondary | ICD-10-CM | POA: Diagnosis not present

## 2013-02-09 DIAGNOSIS — Z1239 Encounter for other screening for malignant neoplasm of breast: Secondary | ICD-10-CM

## 2013-02-09 DIAGNOSIS — R5381 Other malaise: Secondary | ICD-10-CM | POA: Diagnosis not present

## 2013-02-09 DIAGNOSIS — R739 Hyperglycemia, unspecified: Secondary | ICD-10-CM

## 2013-02-09 MED ORDER — ATORVASTATIN CALCIUM 20 MG PO TABS: 20.0000 mg | ORAL_TABLET | Freq: Every day | ORAL | Status: DC

## 2013-02-09 MED ORDER — BUPROPION HCL ER (XL) 300 MG PO TB24: ORAL_TABLET | ORAL | Status: DC

## 2013-02-09 NOTE — Progress Notes (Signed)
Subjective:    Patient ID: Julie [name], female    DOB: 12-30-1945, 67 y.o.   MRN: [id number]  HPI  Here for medicare wellness  Diet: heart healthy  Physical activity: active Depression/mood screen: negative Hearing: intact to whispered voice Visual acuity: grossly normal, performs annual eye exam  ADLs: capable Fall risk: none Home safety: good Cognitive evaluation: intact to orientation, naming, recall and repetition EOL planning: adv directives, full code/ I agree  I have personally reviewed and have noted 1. The patient's medical and social history 2. Their use of alcohol, tobacco or illicit drugs 3. Their current medications and supplements 4. The patient's functional ability including ADL's, fall risks, home safety risks and hearing or visual impairment. 5. Diet and physical activities 6. Evidence for depression or mood disorders   Also reviewed chronic medical issues today:  Anxiety/depression. Acute exacerbation of symptoms September 16, 2012related to unexpected death of spouse. Taking Xanax chronically for daily for years, no increased use despite increased stress. Also helping daughter to care for triplets born October 2012. Travels to [facility] Monday through Thursday to help provide care for same. Denies SI/HI, feels support system intact via church and family  Arthritis. Diffuse but especially in right ankle status post ankle replacement. Uses over-the-counter anti-inflammatories as needed, no specific daily pain medication. No swelling or acute flares  Dyslipidemia. On atorvastatin since 2008. Largely prescribed due to strong family history heart disease and dyslipidemia. Reports compliance with medication as prescribed denies adverse side effects related to treatment  Past Medical History  Diagnosis Date  . Arthritis   . Allergy   . Anxiety   . Depression   . GERD (gastroesophageal reflux disease)   . Hyperglycemia   . GERD 04/08/2007    Qualifier:  Diagnosis of  By: Everett Graff    . EXOGENOUS OBESITY 12/26/2008    Qualifier: Diagnosis of  By: Alphonzo Severance MD, Loni Dolly   . Dyslipidemia 06/30/2011  . ARTHRITIS, TRAUMATIC, ANKLE 01/01/2010    Qualifier: Diagnosis of  By: Alphonzo Severance MD, Loni Dolly ANXIETY DEPRESSION 12/02/2007    Qualifier: Diagnosis of  By: Alphonzo Severance MD, Loni Dolly   . ALLERGIC RHINITIS 04/08/2007    Qualifier: Diagnosis of  By: Tawanna Cooler RN, Alvino Chapel     Family History  Problem Relation Age of Onset  . Hyperlipidemia Father   . Heart attack Brother 47    stents x 2 (47 & 66 yo)  . Coronary artery disease Father     cabg 70yo  . Leukemia Father   . Diabetes Mother   . Hyperlipidemia Mother   . Hypertension Mother   . Diabetes Brother   . Hypertension Father    History  Substance Use Topics  . Smoking status: Never Smoker   . Smokeless tobacco: Never Used  . Alcohol Use: No     Review of Systems  Respiratory: Negative for cough and shortness of breath.   Cardiovascular: Negative for chest pain and palpitations.  Psychiatric/Behavioral: Negative for hallucinations, confusion and self-injury. The patient is not hyperactive.   Gastrointestinal: Negative for abdominal pain, no bowel changes.  Musculoskeletal: Negative for gait problem or joint swelling.  Skin: Negative for rash.  Neurological: Negative for dizziness or headache.  No other specific complaints in a complete review of systems (except as listed in HPI above).      Objective:   Physical Exam  BP 122/84  Pulse 61  Temp(Src) 98.5 F (36.9 C) (Oral)  Ht 5\' 8"  (1.727 m)  Wt 188 lb (85.276 kg)  BMI 28.59 kg/m2  SpO2 97% Wt Readings from Last 3 Encounters:  02/09/13 188 lb (85.276 kg)  02/03/12 182 lb 12.8 oz (82.918 kg)  06/30/11 193 lb 6.4 oz (87.726 kg)   Constitutional: She appears well-developed and well-nourished. No distress.  HENT: NCAT, B TMs clear without wax/effusion, OP clear with good dentition Neck: Normal range of motion.  Neck supple. No JVD or LAD present. No thyromegaly present.  Cardiovascular: Normal rate, regular rhythm and normal heart sounds.  No murmur heard. No BLE edema. Pulmonary/Chest: Effort normal and breath sounds normal. No respiratory distress. She has no wheezes.  Musculoskeletal: Chronic enlargement of right ankle related to joint replacement. FROM and ligamentous function intact. Otherwise normal range of motion, no joint effusions. Neurological: She is alert and oriented to person, place, and time. No cranial nerve deficit. Coordination normal.  Psychiatric: She has a dysphoric mood and affect. Her behavior is normal. Judgment and thought content normal.   Lab Results  Component Value Date   WBC 9.2 12/26/2010   HGB 13.5 12/26/2010   HCT 39.9 12/26/2010   PLT 248.0 12/26/2010   GLUCOSE 108* 02/03/2012   CHOL 127 02/03/2012   TRIG 110.0 02/03/2012   HDL 60.00 02/03/2012   LDLDIRECT 130.6 12/18/2008   LDLCALC 45 02/03/2012   ALT 24 12/26/2010   AST 26 12/26/2010   NA 141 02/03/2012   K 4.3 02/03/2012   CL 105 02/03/2012   CREATININE 1.0 02/03/2012   BUN 15 02/03/2012   CO2 28 02/03/2012   TSH 2.53 12/26/2010   HGBA1C 5.7 02/06/2012   Ecg: sinus brady - 59bpm - no ischemic changes or arrythmias     Assessment & Plan:  AWV/v70.0 - Today patient counseled on age appropriate routine health concerns for screening and prevention, each reviewed and up to date or declined. Immunizations reviewed and up to date or declined. Labs/ECG reviewed. Risk factors for depression reviewed and negative. Hearing function and visual acuity are intact. ADLs screened and addressed as needed. Functional ability and level of safety reviewed and appropriate. Education, counseling and referrals performed based on assessed risks today. Patient provided with a copy of personalized plan for preventive services.  Fatigue - nonspecific symptoms/exam - check screening labs  Also see problem list. Medications and labs reviewed  today.  Prolonged grief reaction. Precipitated by unexpected death of spouse 04/17/2011. Situation events related to same reviewed in depth today. Patient continues to express anger at situation, but increasing acceptance. Support offered, encourage ongoing counseling. Patient will call if problems or change in symptoms before next OV

## 2013-02-09 NOTE — Assessment & Plan Note (Signed)
On statin for years, largely due to strong FH CAD and dyslipidemia Last lipids reviewed and at goal, check annually The current medical regimen is effective;  continue present plan and medications.  

## 2013-02-09 NOTE — Assessment & Plan Note (Signed)
Hx mild increase fasting glc Strong FH DM Check glc now and a1c if elevated Lab Results  Component Value Date   HGBA1C 5.7 02/06/2012

## 2013-02-09 NOTE — Patient Instructions (Signed)
It was good to see you today. Health Maintenance reviewed - all recommended immunizations and age-appropriate screenings are up-to-date. we'll make referral to [facility] for mammogram . Also for carotid dopplers. Our office will contact you regarding appointment(s) once made. Test(s) ordered today. Your results will be called to you and mailed after review (48-72hours after test completion). If any changes need to be made, you will be notified at that time. Medications reviewed, no changes at this time. Please schedule followup in 12 months for annual visit/labs, call sooner if problems.   Health Maintenance, Females A healthy lifestyle and preventative care can promote health and wellness.  Maintain regular health, dental, and eye exams.  Eat a healthy diet. Foods like vegetables, fruits, whole grains, low-fat dairy products, and lean protein foods contain the nutrients you need without too many calories. Decrease your intake of foods high in solid fats, added sugars, and salt. Get information about a proper diet from your caregiver, if necessary.  Regular physical exercise is one of the most important things you can do for your health. Most adults should get at least 150 minutes of moderate-intensity exercise (any activity that increases your heart rate and causes you to sweat) each week. In addition, most adults need muscle-strengthening exercises on 2 or more days a week.   Maintain a healthy weight. The body mass index (BMI) is a screening tool to identify possible weight problems. It provides an estimate of body fat based on height and weight. Your caregiver can help determine your BMI, and can help you achieve or maintain a healthy weight. For adults 20 years and older:  A BMI below 18.5 is considered underweight.  A BMI of 18.5 to 24.9 is normal.  A BMI of 25 to 29.9 is considered overweight.  A BMI of 30 and above is considered obese.  Maintain normal blood lipids and cholesterol by  exercising and minimizing your intake of saturated fat. Eat a balanced diet with plenty of fruits and vegetables. Blood tests for lipids and cholesterol should begin at age 66 and be repeated every 5 years. If your lipid or cholesterol levels are high, you are over 50, or you are a high risk for heart disease, you may need your cholesterol levels checked more frequently.Ongoing high lipid and cholesterol levels should be treated with medicines if diet and exercise are not effective.  If you smoke, find out from your caregiver how to quit. If you do not use tobacco, do not start.  If you are pregnant, do not drink alcohol. If you are breastfeeding, be very cautious about drinking alcohol. If you are not pregnant and choose to drink alcohol, do not exceed 1 drink per day. One drink is considered to be 12 ounces (355 mL) of beer, 5 ounces (148 mL) of wine, or 1.5 ounces (44 mL) of liquor.  Avoid use of street drugs. Do not share needles with anyone. Ask for help if you need support or instructions about stopping the use of drugs.  High blood pressure causes heart disease and increases the risk of stroke. Blood pressure should be checked at least every 1 to 2 years. Ongoing high blood pressure should be treated with medicines, if weight loss and exercise are not effective.  If you are 24 to 67 years old, ask your caregiver if you should take aspirin to prevent strokes.  Diabetes screening involves taking a blood sample to check your fasting blood sugar level. This should be done once every 3 years,  after age 69, if you are within normal weight and without risk factors for diabetes. Testing should be considered at a younger age or be carried out more frequently if you are overweight and have at least 1 risk factor for diabetes.  Breast cancer screening is essential preventative care for women. You should practice "breast self-awareness." This means understanding the normal appearance and feel of your  breasts and may include breast self-examination. Any changes detected, no matter how small, should be reported to a caregiver. Women in their 22s and 30s should have a clinical breast exam (CBE) by a caregiver as part of a regular health exam every 1 to 3 years. After age 66, women should have a CBE every year. Starting at age 49, women should consider having a mammogram (breast X-ray) every year. Women who have a family history of breast cancer should talk to their caregiver about genetic screening. Women at a high risk of breast cancer should talk to their caregiver about having an MRI and a mammogram every year.  The Pap test is a screening test for cervical cancer. Women should have a Pap test starting at age 9. Between ages 65 and 22, Pap tests should be repeated every 2 years. Beginning at age 47, you should have a Pap test every 3 years as long as the past 3 Pap tests have been normal. If you had a hysterectomy for a problem that was not cancer or a condition that could lead to cancer, then you no longer need Pap tests. If you are between ages 84 and 41, and you have had normal Pap tests going back 10 years, you no longer need Pap tests. If you have had past treatment for cervical cancer or a condition that could lead to cancer, you need Pap tests and screening for cancer for at least 20 years after your treatment. If Pap tests have been discontinued, risk factors (such as a new sexual partner) need to be reassessed to determine if screening should be resumed. Some women have medical problems that increase the chance of getting cervical cancer. In these cases, your caregiver may recommend more frequent screening and Pap tests.  The human papillomavirus (HPV) test is an additional test that may be used for cervical cancer screening. The HPV test looks for the virus that can cause the cell changes on the cervix. The cells collected during the Pap test can be tested for HPV. The HPV test could be used to  screen women aged 57 years and older, and should be used in women of any age who have unclear Pap test results. After the age of 75, women should have HPV testing at the same frequency as a Pap test.  Colorectal cancer can be detected and often prevented. Most routine colorectal cancer screening begins at the age of 68 and continues through age 57. However, your caregiver may recommend screening at an earlier age if you have risk factors for colon cancer. On a yearly basis, your caregiver may provide home test kits to check for hidden blood in the stool. Use of a small camera at the end of a tube, to directly examine the colon (sigmoidoscopy or colonoscopy), can detect the earliest forms of colorectal cancer. Talk to your caregiver about this at age 48, when routine screening begins. Direct examination of the colon should be repeated every 5 to 10 years through age 43, unless early forms of pre-cancerous polyps or small growths are found.  Hepatitis C blood testing  is recommended for all people born from [birth year] through 1965 and any individual with known risks for hepatitis C.  Practice safe sex. Use condoms and avoid high-risk sexual practices to reduce the spread of sexually transmitted infections (STIs). Sexually active women aged 51 and younger should be checked for Chlamydia, which is a common sexually transmitted infection. Older women with new or multiple partners should also be tested for Chlamydia. Testing for other STIs is recommended if you are sexually active and at increased risk.  Osteoporosis is a disease in which the bones lose minerals and strength with aging. This can result in serious bone fractures. The risk of osteoporosis can be identified using a bone density scan. Women ages 52 and over and women at risk for fractures or osteoporosis should discuss screening with their caregivers. Ask your caregiver whether you should be taking a calcium supplement or vitamin D to reduce the rate of  osteoporosis.  Menopause can be associated with physical symptoms and risks. Hormone replacement therapy is available to decrease symptoms and risks. You should talk to your caregiver about whether hormone replacement therapy is right for you.  Use sunscreen with a sun protection factor (SPF) of 30 or greater. Apply sunscreen liberally and repeatedly throughout the day. You should seek shade when your shadow is shorter than you. Protect yourself by wearing long sleeves, pants, a wide-brimmed hat, and sunglasses year round, whenever you are outdoors.  Notify your caregiver of new moles or changes in moles, especially if there is a change in shape or color. Also notify your caregiver if a mole is larger than the size of a pencil eraser.  Stay current with your immunizations. Document Released: 01/13/2011 Document Revised: 09/22/2011 Document Reviewed: 01/13/2011 [facility] Patient Information 2014 [place], [place].

## 2013-02-10 ENCOUNTER — Other Ambulatory Visit (INDEPENDENT_AMBULATORY_CARE_PROVIDER_SITE_OTHER): Payer: Medicare Other

## 2013-02-10 ENCOUNTER — Encounter: Payer: Self-pay | Admitting: *Deleted

## 2013-02-10 DIAGNOSIS — R739 Hyperglycemia, unspecified: Secondary | ICD-10-CM

## 2013-02-10 DIAGNOSIS — R5383 Other fatigue: Secondary | ICD-10-CM | POA: Diagnosis not present

## 2013-02-10 DIAGNOSIS — R7309 Other abnormal glucose: Secondary | ICD-10-CM

## 2013-02-10 DIAGNOSIS — E785 Hyperlipidemia, unspecified: Secondary | ICD-10-CM | POA: Diagnosis not present

## 2013-02-10 DIAGNOSIS — R5381 Other malaise: Secondary | ICD-10-CM

## 2013-02-10 LAB — BASIC METABOLIC PANEL
BUN: 14 mg/dL (ref 6–23)
CO2: 27 mEq/L (ref 19–32)
Chloride: 108 mEq/L (ref 96–112)
GFR: 60.92 mL/min (ref >60.00)
Glucose, Bld: 102 mg/dL — ABNORMAL HIGH (ref 70–99)
Potassium: 4.6 mEq/L (ref 3.5–5.1)
Sodium: 141 mEq/L (ref 135–145)

## 2013-02-10 LAB — CBC WITH DIFFERENTIAL/PLATELET
Basophils Absolute: 0.1 10*3/uL (ref 0.0–0.1)
HCT: 40.3 % (ref 36.0–46.0)
Hemoglobin: 13.4 g/dL (ref 12.0–15.0)
Lymphs Abs: 2 10*3/uL (ref 0.7–4.0)
MCHC: 33.4 g/dL (ref 30.0–36.0)
MCV: 87.9 fl (ref 78.0–100.0)
Monocytes Absolute: 0.3 10*3/uL (ref 0.1–1.0)
Monocytes Relative: 4.5 % (ref 3.0–12.0)
Neutro Abs: 4.6 10*3/uL (ref 1.4–7.7)
Platelets: 239 10*3/uL (ref 150.0–400.0)
RDW: 13.8 % (ref 11.5–14.6)

## 2013-02-10 LAB — HEPATIC FUNCTION PANEL
Bilirubin, Direct: 0.1 mg/dL (ref 0.0–0.3)
Total Bilirubin: 0.5 mg/dL (ref 0.3–1.2)

## 2013-02-10 LAB — LIPID PANEL
LDL Cholesterol: 47 mg/dL (ref 0–99)
Total CHOL/HDL Ratio: 2
Triglycerides: 78 mg/dL (ref 0.0–149.0)

## 2013-02-10 LAB — TSH: TSH: 3.21 u[IU]/mL (ref 0.35–5.50)

## 2013-02-10 LAB — HEMOGLOBIN A1C: Hgb A1c MFr Bld: 5.9 % (ref 4.6–6.5)

## 2013-02-22 ENCOUNTER — Encounter: Payer: Self-pay | Admitting: Internal Medicine

## 2013-02-22 DIAGNOSIS — Z1231 Encounter for screening mammogram for malignant neoplasm of breast: Secondary | ICD-10-CM | POA: Diagnosis not present

## 2013-02-22 LAB — HM MAMMOGRAPHY

## 2013-02-23 ENCOUNTER — Encounter (INDEPENDENT_AMBULATORY_CARE_PROVIDER_SITE_OTHER): Payer: Medicare Other

## 2013-02-23 DIAGNOSIS — E785 Hyperlipidemia, unspecified: Secondary | ICD-10-CM

## 2013-02-23 DIAGNOSIS — I6529 Occlusion and stenosis of unspecified carotid artery: Secondary | ICD-10-CM | POA: Diagnosis not present

## 2013-02-23 DIAGNOSIS — R739 Hyperglycemia, unspecified: Secondary | ICD-10-CM

## 2013-03-29 DIAGNOSIS — L821 Other seborrheic keratosis: Secondary | ICD-10-CM | POA: Diagnosis not present

## 2013-03-29 DIAGNOSIS — L820 Inflamed seborrheic keratosis: Secondary | ICD-10-CM | POA: Diagnosis not present

## 2013-03-29 DIAGNOSIS — Z85820 Personal history of malignant melanoma of skin: Secondary | ICD-10-CM | POA: Diagnosis not present

## 2013-03-29 DIAGNOSIS — D2370 Other benign neoplasm of skin of unspecified lower limb, including hip: Secondary | ICD-10-CM | POA: Diagnosis not present

## 2013-03-29 DIAGNOSIS — L253 Unspecified contact dermatitis due to other chemical products: Secondary | ICD-10-CM | POA: Diagnosis not present

## 2013-03-29 DIAGNOSIS — L819 Disorder of pigmentation, unspecified: Secondary | ICD-10-CM | POA: Diagnosis not present

## 2013-03-29 DIAGNOSIS — L909 Atrophic disorder of skin, unspecified: Secondary | ICD-10-CM | POA: Diagnosis not present

## 2013-04-04 ENCOUNTER — Encounter (HOSPITAL_COMMUNITY): Payer: Self-pay | Admitting: Emergency Medicine

## 2013-04-04 ENCOUNTER — Emergency Department (HOSPITAL_COMMUNITY)
Admission: EM | Admit: 2013-04-04 | Discharge: 2013-04-04 | Disposition: A | Discharge status: Home / Self Care | Payer: Medicare Other | Attending: Emergency Medicine | Admitting: Emergency Medicine

## 2013-04-04 ENCOUNTER — Emergency Department (HOSPITAL_COMMUNITY): Payer: Medicare Other

## 2013-04-04 DIAGNOSIS — R7309 Other abnormal glucose: Secondary | ICD-10-CM | POA: Diagnosis not present

## 2013-04-04 DIAGNOSIS — R5381 Other malaise: Secondary | ICD-10-CM | POA: Insufficient documentation

## 2013-04-04 DIAGNOSIS — R110 Nausea: Secondary | ICD-10-CM | POA: Insufficient documentation

## 2013-04-04 DIAGNOSIS — Z90710 Acquired absence of both cervix and uterus: Secondary | ICD-10-CM | POA: Diagnosis not present

## 2013-04-04 DIAGNOSIS — F411 Generalized anxiety disorder: Secondary | ICD-10-CM | POA: Insufficient documentation

## 2013-04-04 DIAGNOSIS — J069 Acute upper respiratory infection, unspecified: Secondary | ICD-10-CM

## 2013-04-04 DIAGNOSIS — R05 Cough: Secondary | ICD-10-CM | POA: Diagnosis not present

## 2013-04-04 DIAGNOSIS — Z91040 Latex allergy status: Secondary | ICD-10-CM | POA: Diagnosis not present

## 2013-04-04 DIAGNOSIS — M129 Arthropathy, unspecified: Secondary | ICD-10-CM | POA: Diagnosis not present

## 2013-04-04 DIAGNOSIS — R631 Polydipsia: Secondary | ICD-10-CM | POA: Insufficient documentation

## 2013-04-04 DIAGNOSIS — F3289 Other specified depressive episodes: Secondary | ICD-10-CM | POA: Insufficient documentation

## 2013-04-04 DIAGNOSIS — E785 Hyperlipidemia, unspecified: Secondary | ICD-10-CM | POA: Insufficient documentation

## 2013-04-04 DIAGNOSIS — R531 Weakness: Secondary | ICD-10-CM

## 2013-04-04 DIAGNOSIS — R059 Cough, unspecified: Secondary | ICD-10-CM | POA: Insufficient documentation

## 2013-04-04 DIAGNOSIS — E669 Obesity, unspecified: Secondary | ICD-10-CM | POA: Insufficient documentation

## 2013-04-04 DIAGNOSIS — K219 Gastro-esophageal reflux disease without esophagitis: Secondary | ICD-10-CM | POA: Insufficient documentation

## 2013-04-04 DIAGNOSIS — R350 Frequency of micturition: Secondary | ICD-10-CM | POA: Diagnosis not present

## 2013-04-04 DIAGNOSIS — R739 Hyperglycemia, unspecified: Secondary | ICD-10-CM

## 2013-04-04 DIAGNOSIS — E119 Type 2 diabetes mellitus without complications: Secondary | ICD-10-CM | POA: Diagnosis not present

## 2013-04-04 DIAGNOSIS — F329 Major depressive disorder, single episode, unspecified: Secondary | ICD-10-CM | POA: Insufficient documentation

## 2013-04-04 DIAGNOSIS — Z7902 Long term (current) use of antithrombotics/antiplatelets: Secondary | ICD-10-CM | POA: Insufficient documentation

## 2013-04-04 LAB — COMPREHENSIVE METABOLIC PANEL
ALT: 22 U/L (ref 0–35)
AST: 24 U/L (ref 0–37)
Albumin: 3.8 g/dL (ref 3.5–5.2)
Alkaline Phosphatase: 73 U/L (ref 39–117)
CO2: 25 mEq/L (ref 19–32)
Calcium: 9.1 mg/dL (ref 8.4–10.5)
Chloride: 104 mEq/L (ref 96–112)
GFR calc non Af Amer: 72 mL/min — ABNORMAL LOW (ref >90)
Potassium: 3.7 mEq/L (ref 3.5–5.1)
Total Bilirubin: 0.3 mg/dL (ref 0.3–1.2)

## 2013-04-04 LAB — CBC WITH DIFFERENTIAL/PLATELET
Basophils Absolute: 0 10*3/uL (ref 0.0–0.1)
Basophils Relative: 0 % (ref 0–1)
Eosinophils Absolute: 0.1 10*3/uL (ref 0.0–0.7)
HCT: 40.8 % (ref 36.0–46.0)
Hemoglobin: 13.6 g/dL (ref 12.0–15.0)
Lymphocytes Relative: 19 % (ref 12–46)
Lymphs Abs: 1.7 10*3/uL (ref 0.7–4.0)
MCH: 29.3 pg (ref 26.0–34.0)
MCHC: 33.3 g/dL (ref 30.0–36.0)
MCV: 87.9 fL (ref 78.0–100.0)
Monocytes Absolute: 0.5 10*3/uL (ref 0.1–1.0)
Neutro Abs: 6.6 10*3/uL (ref 1.7–7.7)
Neutrophils Relative %: 73 % (ref 43–77)
RBC: 4.64 MIL/uL (ref 3.87–5.11)
RDW: 12.6 % (ref 11.5–15.5)
WBC: 9 10*3/uL (ref 4.0–10.5)

## 2013-04-04 LAB — URINALYSIS, ROUTINE W REFLEX MICROSCOPIC
Bilirubin Urine: NEGATIVE
Glucose, UA: NEGATIVE mg/dL
Hgb urine dipstick: NEGATIVE
Ketones, ur: NEGATIVE mg/dL
Protein, ur: NEGATIVE mg/dL
Urobilinogen, UA: 0.2 mg/dL (ref 0.0–1.0)
pH: 6.5 (ref 5.0–8.0)

## 2013-04-04 MED ORDER — SODIUM CHLORIDE 0.9 % IV SOLN
Freq: Once | INTRAVENOUS | Status: AC
  Administered 2013-04-04: 07:00:00 via INTRAVENOUS

## 2013-04-04 NOTE — ED Notes (Signed)
Pt arrived to the Ed with a complaint of being dehydrated due to taking cough medicine for a cold.  Pt has been experiencing cough and cold symptoms since Friday.  Pt has used Nyquil day and night, flonase, and cough medicine.  Pt states she has been up all night drinking water and urinating.

## 2013-04-04 NOTE — ED Notes (Signed)
Pt observed ambulating from triage to room without any difficulty.

## 2013-04-04 NOTE — ED Notes (Signed)
Patient transported to X-ray 

## 2013-04-04 NOTE — ED Provider Notes (Signed)
CSN: [id number]     Arrival date & time 04/04/13  0547 History   First MD Initiated Contact with Patient 04/04/13 [phone]     Chief Complaint  Patient presents with  . Weakness  . Dehydration   (Consider location/radiation/quality/duration/timing/severity/associated sxs/prior Treatment) Patient is a 67 y.o. female presenting with weakness. The history is provided by the patient.  Weakness  She started having a scratchy throat and a dry cough 2 days ago. She started taking NyQuil and DayQuil but try and treat the cough. Since then, she has felt generally weak and has noted urinary frequency and she has been more thirsty than normal. Denies fever or chills or sweats. There's been mild nausea but no vomiting. Denies diarrhea. She denies arthralgias and myalgias. She is worried that she is getting dehydrated because she felt weak like this once before when she got dehydrated from excessive vomiting. Of note, there is a family history of diabetes and she has had mild hyperglycemia in the past but never high enough to be treated. She denies any sick contacts.  Past Medical History  Diagnosis Date  . Arthritis   . Anxiety     chronic BZs  . Depression   . GERD (gastroesophageal reflux disease)   . Hyperglycemia   . GERD   . EXOGENOUS OBESITY   . Dyslipidemia   . ARTHRITIS, TRAUMATIC, ANKLE   . ALLERGIC RHINITIS    Past Surgical History  Procedure Laterality Date  . Spine surgery    . Joint replacement  06/2010    R ankle  . Abdominal hysterectomy  10/1978  . Fracture surgery    . Removal multiple cyst breast left and right    . Sling anterior repair  12/1999  . Arthroscopy left knee  05/2002, 04/2010  . C. surgery history and scan sheet    . Cholecystectomy  09/2004  . Breast surgery  11/1988    Benign mass (R) breast  . Toe exostosis    . Cataract surgery      (L) eye 09/06/12 & (R) eye 10/11/12  . Cysto sling  10/2006  . Ankle arthroscopy Right 10/19/12    deorio   Family History   Problem Relation Age of Onset  . Hyperlipidemia Father   . Heart attack Brother 47    stents x 2 (47 & 68 yo)  . Coronary artery disease Father     cabg 70yo  . Leukemia Father   . Diabetes Mother   . Hyperlipidemia Mother   . Hypertension Mother   . Diabetes Brother   . Hypertension Father    History  Substance Use Topics  . Smoking status: Never Smoker   . Smokeless tobacco: Never Used     Comment: widowed 03/2011 - 2 grown supportive dtrs, 5 g-kis (3 are triplets)  . Alcohol Use: No   OB History   Grav Para Term Preterm Abortions TAB SAB Ect Mult Living                 Review of Systems  Neurological: Positive for weakness.  All other systems reviewed and are negative.    Allergies  Hydrocodone-acetaminophen; Latex; and Oxycodone-acetaminophen  Home Medications   Current Outpatient Rx  Name  Route  Sig  Dispense  Refill  . ALPRAZolam (XANAX) 1 MG tablet      take 1 tablet by mouth every morning and 1 tablet MID AFTERNOON AND 2 TABLETS AT BEDTIME IF NEEDED FOR STRESS   120 tablet  5   . aspirin 81 MG tablet   Oral   Take 81 mg by mouth daily.           Marland Kitchen atorvastatin (LIPITOR) 20 MG tablet   Oral   Take 1 tablet (20 mg total) by mouth daily.   30 tablet   11   . buPROPion (WELLBUTRIN XL) 300 MG 24 hr tablet      take 1 tablet by mouth once daily   30 tablet   11   . Calcium Carbonate-Vitamin D (CALTRATE 600+D) 600-400 MG-UNIT per tablet   Oral   Take 1 tablet by mouth daily.           . fexofenadine (RA ALLERGY RELIEF) 180 MG tablet   Oral   Take 1 tablet (180 mg total) by mouth daily.   30 tablet   2     Overdue for yearly physical must see md b4 any add ...   . Multiple Vitamin (MULTIVITAMIN) tablet   Oral   Take 1 tablet by mouth daily.            BP 168/86  Pulse 64  Temp(Src) 98.7 F (37.1 C) (Oral)  Resp 19  Ht 5\' 8"  (1.727 m)  Wt 185 lb (83.915 kg)  BMI 28.14 kg/m2  SpO2 95% Physical Exam  Nursing note and vitals  reviewed.  67 year old female, resting comfortably and in no acute distress. Vital signs are significant for hypertension with blood pressure 160/86. Oxygen saturation is 95%, which is normal. Head is normocephalic and atraumatic. PERRLA, EOMI. Oropharynx is clear. Neck is nontender and supple without adenopathy or JVD. Back is nontender and there is no CVA tenderness. Lungs are clear without rales, wheezes, or rhonchi. Chest is nontender. Heart has regular rate and rhythm without murmur. Abdomen is soft, flat, nontender without masses or hepatosplenomegaly and peristalsis is normoactive. Extremities have no cyanosis or edema, full range of motion is present. Skin is warm and dry without rash. Neurologic: Mental status is normal, cranial nerves are intact, there are no motor or sensory deficits.  ED Course  Procedures (including critical care time) Labs Review Results for orders placed during the hospital encounter of 04/04/13  CBC WITH DIFFERENTIAL      Result Value Range   WBC 9.0  4.0 - 10.5 K/uL   RBC 4.64  3.87 - 5.11 MIL/uL   Hemoglobin 13.6  12.0 - 15.0 g/dL   HCT 16.1  09.6 - 04.5 %   MCV 87.9  78.0 - 100.0 fL   MCH 29.3  26.0 - 34.0 pg   MCHC 33.3  30.0 - 36.0 g/dL   RDW 40.9  81.1 - 91.4 %   Platelets 217  150 - 400 K/uL   Neutrophils Relative % 73  43 - 77 %   Neutro Abs 6.6  1.7 - 7.7 K/uL   Lymphocytes Relative 19  12 - 46 %   Lymphs Abs 1.7  0.7 - 4.0 K/uL   Monocytes Relative 6  3 - 12 %   Monocytes Absolute 0.5  0.1 - 1.0 K/uL   Eosinophils Relative 2  0 - 5 %   Eosinophils Absolute 0.1  0.0 - 0.7 K/uL   Basophils Relative 0  0 - 1 %   Basophils Absolute 0.0  0.0 - 0.1 K/uL  COMPREHENSIVE METABOLIC PANEL      Result Value Range   Sodium 140  135 - 145 mEq/L   Potassium 3.7  3.5 - 5.1 mEq/L   Chloride 104  96 - 112 mEq/L   CO2 25  19 - 32 mEq/L   Glucose, Bld 134 (*) 70 - 99 mg/dL   BUN 8  6 - 23 mg/dL   Creatinine, Ser 1.61  0.50 - 1.10 mg/dL   Calcium  9.1  8.4 - 09.6 mg/dL   Total Protein 7.5  6.0 - 8.3 g/dL   Albumin 3.8  3.5 - 5.2 g/dL   AST 24  0 - 37 U/L   ALT 22  0 - 35 U/L   Alkaline Phosphatase 73  39 - 117 U/L   Total Bilirubin 0.3  0.3 - 1.2 mg/dL   GFR calc non Af Amer 72 (*) >90 mL/min   GFR calc Af Amer 83 (*) >90 mL/min  URINALYSIS, ROUTINE W REFLEX MICROSCOPIC      Result Value Range   Color, Urine YELLOW  YELLOW   APPearance CLEAR  CLEAR   Specific Gravity, Urine 1.006  1.005 - 1.030   pH 6.5  5.0 - 8.0   Glucose, UA NEGATIVE  NEGATIVE mg/dL   Hgb urine dipstick NEGATIVE  NEGATIVE   Bilirubin Urine NEGATIVE  NEGATIVE   Ketones, ur NEGATIVE  NEGATIVE mg/dL   Protein, ur NEGATIVE  NEGATIVE mg/dL   Urobilinogen, UA 0.2  0.0 - 1.0 mg/dL   Nitrite NEGATIVE  NEGATIVE   Leukocytes, UA NEGATIVE  NEGATIVE   Imaging Review Dg Chest 2 View  04/04/2013   CLINICAL DATA:  Cough and congestion  EXAM: CHEST  2 VIEW  COMPARISON:  None.  FINDINGS: The heart size and mediastinal contours are within normal limits. Both lungs are clear. Cholecystectomy clips.  IMPRESSION: No active cardiopulmonary disease.   Electronically Signed   By: Tiburcio Pea   On: 04/04/2013 06:56    MDM   1. Weakness   2. Upper respiratory infection   3. Hyperglycemia    Weakness which may be part of a viral syndrome. However, need to consider possibility of new onset diabetes since she is having some polyuria and polydipsia. Review of past records shows that she has had mild hyperglycemia but the highest blood sugar that has ever been recorded for her was 115. Clinically, she is not dehydrated.  Workup is significant only for mild hyperglycemia with blood sugar 134. Weakness is probably part of a viral syndrome. She is discharged with reassurance and is told that her blood sugar followed by her PCP.  Dione Booze, MD 04/04/13 [phone]

## 2013-04-18 ENCOUNTER — Encounter: Payer: Self-pay | Admitting: Internal Medicine

## 2013-04-18 ENCOUNTER — Ambulatory Visit (INDEPENDENT_AMBULATORY_CARE_PROVIDER_SITE_OTHER): Payer: Medicare Other | Admitting: Internal Medicine

## 2013-04-18 ENCOUNTER — Other Ambulatory Visit: Payer: Self-pay | Admitting: Internal Medicine

## 2013-04-18 ENCOUNTER — Other Ambulatory Visit (INDEPENDENT_AMBULATORY_CARE_PROVIDER_SITE_OTHER): Payer: Medicare Other

## 2013-04-18 VITALS — BP 132/80 | HR 56 | Temp 97.8°F | Wt 187.1 lb

## 2013-04-18 DIAGNOSIS — Z23 Encounter for immunization: Secondary | ICD-10-CM | POA: Diagnosis not present

## 2013-04-18 DIAGNOSIS — R739 Hyperglycemia, unspecified: Secondary | ICD-10-CM

## 2013-04-18 DIAGNOSIS — F341 Dysthymic disorder: Secondary | ICD-10-CM | POA: Diagnosis not present

## 2013-04-18 DIAGNOSIS — R7309 Other abnormal glucose: Secondary | ICD-10-CM

## 2013-04-18 LAB — HEMOGLOBIN A1C: Hgb A1c MFr Bld: 5.9 % (ref 4.6–6.5)

## 2013-04-18 NOTE — Patient Instructions (Addendum)
It was good to see you today. Your annual flu shot was given and/or updated today. We have reviewed your ER records including labs and tests today Test(s) ordered today. Your results will be released to MyChart (or called to you) after review, usually within 72hours after test completion. If any changes need to be made, you will be notified at that same time. Medications reviewed and updated, no changes recommended at this time. Please schedule followup in 6-12 months, call sooner if problems.

## 2013-04-18 NOTE — Progress Notes (Signed)
  Subjective:    Patient ID: Julie [name], female    DOB: [birth year], 67 y.o.   MRN: [id number]  HPI Here for ER follow up - seen 04/04/13 for weakness, hyperglycemia (134) and URI  Past Medical History  Diagnosis Date  . Arthritis   . Anxiety     chronic BZs  . Depression   . GERD (gastroesophageal reflux disease)   . Hyperglycemia   . GERD   . EXOGENOUS OBESITY   . Dyslipidemia   . ARTHRITIS, TRAUMATIC, ANKLE   . ALLERGIC RHINITIS      Review of Systems  Respiratory: Negative for cough and shortness of breath.   Cardiovascular: Negative for chest pain and palpitations.  Psychiatric/Behavioral: Negative for hallucinations, confusion and self-injury. The patient is not hyperactive.        Objective:   Physical Exam BP 132/80  Pulse 56  Temp(Src) 97.8 F (36.6 C) (Oral)  Wt 187 lb 1.9 oz (84.877 kg)  BMI 28.46 kg/m2  SpO2 97% Wt Readings from Last 3 Encounters:  04/18/13 187 lb 1.9 oz (84.877 kg)  04/04/13 185 lb (83.915 kg)  02/09/13 188 lb (85.276 kg)   Constitutional: She is overweight, appears well-developed and well-nourished. No distress.  Cardiovascular: Normal rate, regular rhythm and normal heart sounds.  No murmur heard. No BLE edema. Pulmonary/Chest: Effort normal and breath sounds normal. No respiratory distress. She has no wheezes.  Musculoskeletal: Chronic enlargement of right ankle related to joint replacement. FROM and ligamentous function intact. Otherwise normal range of motion, no joint effusions. Neurological: She is alert and oriented to person, place, and time. No cranial nerve deficit. Coordination normal.  Psychiatric: She has a dysphoric mood and affect. Her behavior is normal. Judgment and thought content normal.   Lab Results  Component Value Date   WBC 9.0 04/04/2013   HGB 13.6 04/04/2013   HCT 40.8 04/04/2013   PLT 217 04/04/2013   GLUCOSE 134* 04/04/2013   CHOL 116 02/10/2013   TRIG 78.0 02/10/2013   HDL 53.40 02/10/2013   LDLDIRECT  130.6 12/18/2008   LDLCALC 47 02/10/2013   ALT 22 04/04/2013   AST 24 04/04/2013   NA 140 04/04/2013   K 3.7 04/04/2013   CL 104 04/04/2013   CREATININE 0.83 04/04/2013   BUN 8 04/04/2013   CO2 25 04/04/2013   TSH 3.21 02/10/2013   HGBA1C 5.9 02/10/2013       Assessment & Plan:   Hyperglycemia, mild - check a1c and advised weight loss with diet/exercise  Also see problem list. Medications and labs reviewed today.

## 2013-04-18 NOTE — Assessment & Plan Note (Signed)
Hx mild increase fasting glc Strong FH DM Check a1c and advised work on diet/exercise to control same   Lab Results  Component Value Date   HGBA1C 5.9 02/10/2013

## 2013-04-18 NOTE — Assessment & Plan Note (Signed)
Strong component of anxiety, on chronic benzodiazepines for years. Reviewed acute grief component related to unexpected death of spouse 2011-04-21 Denies SI/HI Counseling and sympathy provided for ongoing stressors -  continue formal counseling/support group with her church Patient continues to express anger at situation surrounding death of spouse, but increasing acceptance. Support offered, encourage ongoing counseling. Patient will call if problems or change in symptoms before next OV

## 2013-10-27 ENCOUNTER — Other Ambulatory Visit: Payer: Self-pay | Admitting: Internal Medicine

## 2013-10-30 ENCOUNTER — Other Ambulatory Visit: Payer: Self-pay | Admitting: Internal Medicine

## 2013-10-31 NOTE — Telephone Encounter (Signed)
Faxed script back to [facility]...lmb 

## 2013-12-28 DIAGNOSIS — H11449 Conjunctival cysts, unspecified eye: Secondary | ICD-10-CM | POA: Diagnosis not present

## 2013-12-28 DIAGNOSIS — H43819 Vitreous degeneration, unspecified eye: Secondary | ICD-10-CM | POA: Diagnosis not present

## 2014-02-15 ENCOUNTER — Encounter: Payer: Medicare Other | Admitting: Internal Medicine

## 2014-02-23 DIAGNOSIS — Z1231 Encounter for screening mammogram for malignant neoplasm of breast: Secondary | ICD-10-CM | POA: Diagnosis not present

## 2014-02-23 LAB — HM MAMMOGRAPHY

## 2014-02-27 ENCOUNTER — Encounter: Payer: Self-pay | Admitting: Internal Medicine

## 2014-02-28 ENCOUNTER — Other Ambulatory Visit: Payer: Self-pay | Admitting: Internal Medicine

## 2014-03-09 ENCOUNTER — Other Ambulatory Visit (INDEPENDENT_AMBULATORY_CARE_PROVIDER_SITE_OTHER): Payer: Medicare Other

## 2014-03-09 ENCOUNTER — Ambulatory Visit (INDEPENDENT_AMBULATORY_CARE_PROVIDER_SITE_OTHER): Payer: Medicare Other | Admitting: Internal Medicine

## 2014-03-09 ENCOUNTER — Encounter: Payer: Self-pay | Admitting: Internal Medicine

## 2014-03-09 VITALS — BP 118/70 | HR 72 | Temp 97.9°F | Ht 68.0 in | Wt 188.2 lb

## 2014-03-09 DIAGNOSIS — E785 Hyperlipidemia, unspecified: Secondary | ICD-10-CM

## 2014-03-09 DIAGNOSIS — R7309 Other abnormal glucose: Secondary | ICD-10-CM | POA: Diagnosis not present

## 2014-03-09 DIAGNOSIS — Z23 Encounter for immunization: Secondary | ICD-10-CM | POA: Diagnosis not present

## 2014-03-09 DIAGNOSIS — R739 Hyperglycemia, unspecified: Secondary | ICD-10-CM

## 2014-03-09 DIAGNOSIS — R5381 Other malaise: Secondary | ICD-10-CM

## 2014-03-09 DIAGNOSIS — R5383 Other fatigue: Secondary | ICD-10-CM | POA: Diagnosis not present

## 2014-03-09 DIAGNOSIS — Z0000 Encounter for general adult medical examination without abnormal findings: Secondary | ICD-10-CM

## 2014-03-09 LAB — CBC WITH DIFFERENTIAL/PLATELET
Basophils Absolute: 0 10*3/uL (ref 0.0–0.1)
Basophils Relative: 0.2 % (ref 0.0–3.0)
Eosinophils Absolute: 0.1 10*3/uL (ref 0.0–0.7)
Eosinophils Relative: 0.7 % (ref 0.0–5.0)
HCT: 42.4 % (ref 36.0–46.0)
HEMOGLOBIN: 14.2 g/dL (ref 12.0–15.0)
LYMPHS PCT: 17.3 % (ref 12.0–46.0)
Lymphs Abs: 1.8 10*3/uL (ref 0.7–4.0)
MCHC: 33.4 g/dL (ref 30.0–36.0)
MCV: 87.3 fl (ref 78.0–100.0)
MONOS PCT: 5.2 % (ref 3.0–12.0)
Monocytes Absolute: 0.5 10*3/uL (ref 0.1–1.0)
NEUTROS ABS: 7.8 10*3/uL — AB (ref 1.4–7.7)
NEUTROS PCT: 76.6 % (ref 43.0–77.0)
Platelets: 256 10*3/uL (ref 150.0–400.0)
RBC: 4.86 Mil/uL (ref 3.87–5.11)
RDW: 12.8 % (ref 11.5–15.5)
WBC: 10.2 10*3/uL (ref 4.0–10.5)

## 2014-03-09 LAB — LIPID PANEL
Cholesterol: 124 mg/dL (ref 0–200)
HDL: 51.6 mg/dL (ref >39.00)
LDL CALC: 46 mg/dL (ref 0–99)
NonHDL: 72.4
Total CHOL/HDL Ratio: 2
Triglycerides: 132 mg/dL (ref 0.0–149.0)
VLDL: 26.4 mg/dL (ref 0.0–40.0)

## 2014-03-09 LAB — BASIC METABOLIC PANEL
BUN: 14 mg/dL (ref 6–23)
CALCIUM: 9.5 mg/dL (ref 8.4–10.5)
CO2: 29 mEq/L (ref 19–32)
CREATININE: 1.1 mg/dL (ref 0.4–1.2)
Chloride: 104 mEq/L (ref 96–112)
GFR: 51.97 mL/min — AB (ref >60.00)
Glucose, Bld: 103 mg/dL — ABNORMAL HIGH (ref 70–99)
Potassium: 4.6 mEq/L (ref 3.5–5.1)
Sodium: 141 mEq/L (ref 135–145)

## 2014-03-09 LAB — HEMOGLOBIN A1C: Hgb A1c MFr Bld: 5.9 % (ref 4.6–6.5)

## 2014-03-09 LAB — HEPATIC FUNCTION PANEL
ALBUMIN: 4.1 g/dL (ref 3.5–5.2)
ALK PHOS: 64 U/L (ref 39–117)
ALT: 17 U/L (ref 0–35)
AST: 24 U/L (ref 0–37)
Bilirubin, Direct: 0.1 mg/dL (ref 0.0–0.3)
Total Bilirubin: 0.7 mg/dL (ref 0.2–1.2)
Total Protein: 7.7 g/dL (ref 6.0–8.3)

## 2014-03-09 LAB — TSH: TSH: 1.37 u[IU]/mL (ref 0.35–4.50)

## 2014-03-09 NOTE — Assessment & Plan Note (Signed)
On statin for years, largely due to strong FH CAD and dyslipidemia Last lipids reviewed and at goal, check annually The current medical regimen is effective;  continue present plan and medications.

## 2014-03-09 NOTE — Assessment & Plan Note (Signed)
Hx mild increase fasting glc Strong FH DM Check a1c and advised work on diet/exercise to control same   Lab Results  Component Value Date   HGBA1C 5.9 04/18/2013

## 2014-03-09 NOTE — Progress Notes (Signed)
Pre visit review using our clinic review tool, if applicable. No additional management support is needed unless otherwise documented below in the visit note. 

## 2014-03-09 NOTE — Progress Notes (Signed)
Subjective:    Patient ID: Julie [name], female    DOB: 23-Jan-1946, 68 y.o.   MRN: [id number]  HPI   Here for medicare wellness  Diet: heart healthy  Physical activity: sedentary Depression/mood screen: on tx - see screen Hearing: intact to whispered voice Visual acuity: grossly normal, performs annual eye exam  ADLs: capable Fall risk: none Home safety: good Cognitive evaluation: intact to orientation, naming, recall and repetition EOL planning: adv directives, full code/ I agree  I have personally reviewed and have noted 1. The patient's medical and social history 2. Their use of alcohol, tobacco or illicit drugs 3. Their current medications and supplements 4. The patient's functional ability including ADL's, fall risks, home safety risks and hearing or visual impairment. 5. Diet and physical activities 6. Evidence for depression or mood disorders  Also reviewed chronic medical issues and interval medical events  Past Medical History  Diagnosis Date  . Arthritis   . Anxiety     chronic BZs  . Depression   . GERD (gastroesophageal reflux disease)   . Hyperglycemia   . GERD   . EXOGENOUS OBESITY   . Dyslipidemia   . ARTHRITIS, TRAUMATIC, ANKLE   . ALLERGIC RHINITIS    Family History  Problem Relation Age of Onset  . Hyperlipidemia Father   . Heart attack Brother 58    stents x 2 (36 & 29 yo)  . Coronary artery disease Father     cabg 70yo  . Leukemia Father   . Diabetes Mother   . Hyperlipidemia Mother   . Hypertension Mother   . Diabetes Brother   . Hypertension Father    History  Substance Use Topics  . Smoking status: Never Smoker   . Smokeless tobacco: Never Used  . Alcohol Use: No    Review of Systems  Constitutional: Positive for fatigue. Negative for fever.  Respiratory: Negative for cough, shortness of breath and wheezing.   Cardiovascular: Negative for chest pain, palpitations and leg swelling.  Gastrointestinal: Negative for  nausea, abdominal pain and diarrhea.  Neurological: Negative for dizziness, weakness, light-headedness and headaches.  Psychiatric/Behavioral: Positive for dysphoric mood ("only in Sept at the anniversary of Ron's death"). Negative for suicidal ideas, behavioral problems, confusion, self-injury and decreased concentration. The patient is not nervous/anxious.   All other systems reviewed and are negative.      Objective:   Physical Exam  BP 118/70  Pulse 72  Temp(Src) 97.9 F (36.6 C) (Oral)  Ht 5\' 8"  (1.727 m)  Wt 188 lb 4 oz (85.39 kg)  BMI 28.63 kg/m2  SpO2 97% Wt Readings from Last 3 Encounters:  03/09/14 188 lb 4 oz (85.39 kg)  04/18/13 187 lb 1.9 oz (84.877 kg)  04/04/13 185 lb (83.915 kg)   Constitutional: She appears well-developed and well-nourished. No distress.  HENT: Head: Normocephalic and atraumatic. Ears: B TMs ok, no erythema or effusion; Nose: Nose normal. Mouth/Throat: Oropharynx is clear and moist. No oropharyngeal exudate.  Eyes: Conjunctivae and EOM are normal. Pupils are equal, round, and reactive to light. No scleral icterus.  Neck: Normal range of motion. Neck supple. No JVD present. No thyromegaly present.  Cardiovascular: Normal rate, regular rhythm and normal heart sounds.  No murmur heard. No BLE edema. Pulmonary/Chest: Effort normal and breath sounds normal. No respiratory distress. She has no wheezes.  Abdominal: Soft. Bowel sounds are normal. She exhibits no distension. There is no tenderness. no masses GU/breast: defer to gyn Musculoskeletal: Normal range of  motion, no joint effusions. No gross deformities Neurological: She is alert and oriented to person, place, and time. No cranial nerve deficit. Coordination, balance, strength, speech and gait are normal.  Skin: Skin is warm and dry. No rash noted. No erythema.  Psychiatric: She has a dysphoric and occ tearful mood and affect. Her behavior is normal. Judgment and thought content normal.    Lab  Results  Component Value Date   WBC 9.0 04/04/2013   HGB 13.6 04/04/2013   HCT 40.8 04/04/2013   PLT 217 04/04/2013   GLUCOSE 134* 04/04/2013   CHOL 116 02/10/2013   TRIG 78.0 02/10/2013   HDL 53.40 02/10/2013   LDLDIRECT 130.6 12/18/2008   LDLCALC 47 02/10/2013   ALT 22 04/04/2013   AST 24 04/04/2013   NA 140 04/04/2013   K 3.7 04/04/2013   CL 104 04/04/2013   CREATININE 0.83 04/04/2013   BUN 8 04/04/2013   CO2 25 04/04/2013   TSH 3.21 02/10/2013   HGBA1C 5.9 04/18/2013    Dg Chest 2 View  04/04/2013   CLINICAL DATA:  Cough and congestion  EXAM: CHEST  2 VIEW  COMPARISON:  None.  FINDINGS: The heart size and mediastinal contours are within normal limits. Both lungs are clear. Cholecystectomy clips.  IMPRESSION: No active cardiopulmonary disease.   Electronically Signed   By: Jorje Guild   On: 04/04/2013 06:56       Assessment & Plan:   AWV/v70.0 - Today patient counseled on age appropriate routine health concerns for screening and prevention, each reviewed and up to date or declined. Immunizations reviewed and up to date or declined. Labs reviewed. Risk factors for depression reviewed and negative. Hearing function and visual acuity are intact. ADLs screened and addressed as needed. Functional ability and level of safety reviewed and appropriate. Education, counseling and referrals performed based on assessed risks today. Patient provided with a copy of personalized plan for preventive services.  Fatigue - nonspecific symptoms/exam - check screening labs  Problem List Items Addressed This Visit   Dyslipidemia     On statin for years, largely due to strong FH CAD and dyslipidemia Last lipids reviewed and at goal, check annually The current medical regimen is effective;  continue present plan and medications.     Relevant Orders      Lipid panel (Completed)   Hyperglycemia      Hx mild increase fasting glc Strong FH DM Check a1c and advised work on diet/exercise to control same   Lab  Results  Component Value Date   HGBA1C 5.9 04/18/2013      Relevant Orders      Basic metabolic panel (Completed)      Lipid panel (Completed)      Hemoglobin A1c (Completed)    Other Visit Diagnoses   Routine general medical examination at a health care facility    -  Primary    Need for prophylactic vaccination and inoculation against influenza        Relevant Orders       Flu Vaccine QUAD 36+ mos PF IM (Fluarix Quad PF) (Completed)    Other malaise and fatigue        Relevant Orders       Basic metabolic panel (Completed)       CBC with Differential (Completed)       Hepatic function panel (Completed)       TSH (Completed)

## 2014-03-09 NOTE — Patient Instructions (Addendum)
It was good to see you today.  We have reviewed your prior records including labs and tests today  Health Maintenance reviewed - Your annual flu shot was given and/or updated today. All other recommended immunizations and age-appropriate screenings are up-to-date.  Test(s) ordered today. Your results will be released to MyChart (or called to you) after review, usually within 72hours after test completion. If any changes need to be made, you will be notified at that same time.  Medications reviewed and updated, no changes recommended at this time.  Please schedule followup in 12 months for annual exam and labs, call sooner if problems.   Health Maintenance Adopting a healthy lifestyle and getting preventive care can go a long way to promote health and wellness. Talk with your health care provider about what schedule of regular examinations is right for you. This is a good chance for you to check in with your provider about disease prevention and staying healthy. In between checkups, there are plenty of things you can do on your own. Experts have done a lot of research about which lifestyle changes and preventive measures are most likely to keep you healthy. Ask your health care provider for more information. WEIGHT AND DIET  Eat a healthy diet  Be sure to include plenty of vegetables, fruits, low-fat dairy products, and lean protein.  Do not eat a lot of foods high in solid fats, added sugars, or salt.  Get regular exercise. This is one of the most important things you can do for your health.  Most adults should exercise for at least 150 minutes each week. The exercise should increase your heart rate and make you sweat (moderate-intensity exercise).  Most adults should also do strengthening exercises at least twice a week. This is in addition to the moderate-intensity exercise.  Maintain a healthy weight  Body mass index (BMI) is a measurement that can be used to identify possible  weight problems. It estimates body fat based on height and weight. Your health care provider can help determine your BMI and help you achieve or maintain a healthy weight.  For females 68 years of age and older:   A BMI below 18.5 is considered underweight.  A BMI of 18.5 to 24.9 is normal.  A BMI of 25 to 29.9 is considered overweight.  A BMI of 30 and above is considered obese.  Watch levels of cholesterol and blood lipids  You should start having your blood tested for lipids and cholesterol at 68 years of age, then have this test every 5 years.  You may need to have your cholesterol levels checked more often if:  Your lipid or cholesterol levels are high.  You are older than 68 years of age.  You are at high risk for heart disease.  CANCER SCREENING   Lung Cancer  Lung cancer screening is recommended for adults 60-68 years old who are at high risk for lung cancer because of a history of smoking.  A yearly low-dose CT scan of the lungs is recommended for people who:  Currently smoke.  Have quit within the past 15 years.  Have at least a 30-pack-year history of smoking. A pack year is smoking an average of one pack of cigarettes a day for 1 year.  Yearly screening should continue until it has been 15 years since you quit.  Yearly screening should stop if you develop a health problem that would prevent you from having lung cancer treatment.  Breast Cancer  Practice  breast self-awareness. This means understanding how your breasts normally appear and feel.  It also means doing regular breast self-exams. Let your health care provider know about any changes, no matter how small.  If you are in your 68s or 30s, you should have a clinical breast exam (CBE) by a health care provider every 1-3 years as part of a regular health exam.  If you are 68 or older, have a CBE every year. Also consider having a breast X-ray (mammogram) every year.  If you have a family history of  breast cancer, talk to your health care provider about genetic screening.  If you are at high risk for breast cancer, talk to your health care provider about having an MRI and a mammogram every year.  Breast cancer gene (BRCA) assessment is recommended for women who have family members with BRCA-related cancers. BRCA-related cancers include:  Breast.  Ovarian.  Tubal.  Peritoneal cancers.  Results of the assessment will determine the need for genetic counseling and BRCA1 and BRCA2 testing. Cervical Cancer Routine pelvic examinations to screen for cervical cancer are no longer recommended for nonpregnant women who are considered low risk for cancer of the pelvic organs (ovaries, uterus, and vagina) and who do not have symptoms. A pelvic examination may be necessary if you have symptoms including those associated with pelvic infections. Ask your health care provider if a screening pelvic exam is right for you.   The Pap test is the screening test for cervical cancer for women who are considered at risk.  If you had a hysterectomy for a problem that was not cancer or a condition that could lead to cancer, then you no longer need Pap tests.  If you are older than 68 years, and you have had normal Pap tests for the past 10 years, you no longer need to have Pap tests.  If you have had past treatment for cervical cancer or a condition that could lead to cancer, you need Pap tests and screening for cancer for at least 20 years after your treatment.  If you no longer get a Pap test, assess your risk factors if they change (such as having a new sexual partner). This can affect whether you should start being screened again.  Some women have medical problems that increase their chance of getting cervical cancer. If this is the case for you, your health care provider may recommend more frequent screening and Pap tests.  The human papillomavirus (HPV) test is another test that may be used for  cervical cancer screening. The HPV test looks for the virus that can cause cell changes in the cervix. The cells collected during the Pap test can be tested for HPV.  The HPV test can be used to screen women 68 years of age and older. Getting tested for HPV can extend the interval between normal Pap tests from three to five years.  An HPV test also should be used to screen women of any age who have unclear Pap test results.  After 68 years of age, women should have HPV testing as often as Pap tests.  Colorectal Cancer  This type of cancer can be detected and often prevented.  Routine colorectal cancer screening usually begins at 68 years of age and continues through 68 years of age.  Your health care provider may recommend screening at an earlier age if you have risk factors for colon cancer.  Your health care provider may also recommend using home test kits to  check for hidden blood in the stool.  A small camera at the end of a tube can be used to examine your colon directly (sigmoidoscopy or colonoscopy). This is done to check for the earliest forms of colorectal cancer.  Routine screening usually begins at age 72.  Direct examination of the colon should be repeated every 5-10 years through 68 years of age. However, you may need to be screened more often if early forms of precancerous polyps or small growths are found. Skin Cancer  Check your skin from head to toe regularly.  Tell your health care provider about any new moles or changes in moles, especially if there is a change in a mole's shape or color.  Also tell your health care provider if you have a mole that is larger than the size of a pencil eraser.  Always use sunscreen. Apply sunscreen liberally and repeatedly throughout the day.  Protect yourself by wearing long sleeves, pants, a wide-brimmed hat, and sunglasses whenever you are outside. HEART DISEASE, DIABETES, AND HIGH BLOOD PRESSURE   Have your blood pressure  checked at least every 1-2 years. High blood pressure causes heart disease and increases the risk of stroke.  If you are between 33 years and 31 years old, ask your health care provider if you should take aspirin to prevent strokes.  Have regular diabetes screenings. This involves taking a blood sample to check your fasting blood sugar level.  If you are at a normal weight and have a low risk for diabetes, have this test once every three years after 68 years of age.  If you are overweight and have a high risk for diabetes, consider being tested at a younger age or more often. PREVENTING INFECTION  Hepatitis B  If you have a higher risk for hepatitis B, you should be screened for this virus. You are considered at high risk for hepatitis B if:  You were born in a country where hepatitis B is common. Ask your health care provider which countries are considered high risk.  Your parents were born in a high-risk country, and you have not been immunized against hepatitis B (hepatitis B vaccine).  You have HIV or AIDS.  You use needles to inject street drugs.  You live with someone who has hepatitis B.  You have had sex with someone who has hepatitis B.  You get hemodialysis treatment.  You take certain medicines for conditions, including cancer, organ transplantation, and autoimmune conditions. Hepatitis C  Blood testing is recommended for:  Everyone born from [birth year] through 1965.  Anyone with known risk factors for hepatitis C. Sexually transmitted infections (STIs)  You should be screened for sexually transmitted infections (STIs) including gonorrhea and chlamydia if:  You are sexually active and are younger than 68 years of age.  You are older than 68 years of age and your health care provider tells you that you are at risk for this type of infection.  Your sexual activity has changed since you were last screened and you are at an increased risk for chlamydia or gonorrhea. Ask  your health care provider if you are at risk.  If you do not have HIV, but are at risk, it may be recommended that you take a prescription medicine daily to prevent HIV infection. This is called pre-exposure prophylaxis (PrEP). You are considered at risk if:  You are sexually active and do not regularly use condoms or know the HIV status of your partner(s).  You take  drugs by injection.  You are sexually active with a partner who has HIV. Talk with your health care provider about whether you are at high risk of being infected with HIV. If you choose to begin PrEP, you should first be tested for HIV. You should then be tested every 3 months for as long as you are taking PrEP.  PREGNANCY   If you are premenopausal and you may become pregnant, ask your health care provider about preconception counseling.  If you may become pregnant, take 400 to 800 micrograms (mcg) of folic acid every day.  If you want to prevent pregnancy, talk to your health care provider about birth control (contraception). OSTEOPOROSIS AND MENOPAUSE   Osteoporosis is a disease in which the bones lose minerals and strength with aging. This can result in serious bone fractures. Your risk for osteoporosis can be identified using a bone density scan.  If you are 56 years of age or older, or if you are at risk for osteoporosis and fractures, ask your health care provider if you should be screened.  Ask your health care provider whether you should take a calcium or vitamin D supplement to lower your risk for osteoporosis.  Menopause may have certain physical symptoms and risks.  Hormone replacement therapy may reduce some of these symptoms and risks. Talk to your health care provider about whether hormone replacement therapy is right for you.  HOME CARE INSTRUCTIONS   Schedule regular health, dental, and eye exams.  Stay current with your immunizations.   Do not use any tobacco products including cigarettes, chewing  tobacco, or electronic cigarettes.  If you are pregnant, do not drink alcohol.  If you are breastfeeding, limit how much and how often you drink alcohol.  Limit alcohol intake to no more than 1 drink per day for nonpregnant women. One drink equals 12 ounces of beer, 5 ounces of wine, or 1 ounces of hard liquor.  Do not use street drugs.  Do not share needles.  Ask your health care provider for help if you need support or information about quitting drugs.  Tell your health care provider if you often feel depressed.  Tell your health care provider if you have ever been abused or do not feel safe at home. Document Released: 01/13/2011 Document Revised: 11/14/2013 Document Reviewed: 06/01/2013 [facility] Patient Information 2015 [place], [place]. This information is not intended to replace advice given to you by your health care provider. Make sure you discuss any questions you have with your health care provider.

## 2014-03-31 ENCOUNTER — Other Ambulatory Visit: Payer: Self-pay

## 2014-03-31 DIAGNOSIS — L821 Other seborrheic keratosis: Secondary | ICD-10-CM | POA: Diagnosis not present

## 2014-03-31 DIAGNOSIS — L819 Disorder of pigmentation, unspecified: Secondary | ICD-10-CM | POA: Diagnosis not present

## 2014-03-31 DIAGNOSIS — Z85820 Personal history of malignant melanoma of skin: Secondary | ICD-10-CM | POA: Diagnosis not present

## 2014-03-31 DIAGNOSIS — D235 Other benign neoplasm of skin of trunk: Secondary | ICD-10-CM | POA: Diagnosis not present

## 2014-03-31 DIAGNOSIS — D485 Neoplasm of uncertain behavior of skin: Secondary | ICD-10-CM | POA: Diagnosis not present

## 2014-03-31 DIAGNOSIS — D239 Other benign neoplasm of skin, unspecified: Secondary | ICD-10-CM | POA: Diagnosis not present

## 2014-03-31 DIAGNOSIS — L820 Inflamed seborrheic keratosis: Secondary | ICD-10-CM | POA: Diagnosis not present

## 2014-03-31 DIAGNOSIS — D2370 Other benign neoplasm of skin of unspecified lower limb, including hip: Secondary | ICD-10-CM | POA: Diagnosis not present

## 2014-04-13 ENCOUNTER — Encounter: Payer: Self-pay | Admitting: Internal Medicine

## 2014-05-02 ENCOUNTER — Other Ambulatory Visit: Payer: Self-pay | Admitting: Internal Medicine

## 2014-05-02 MED ORDER — ALPRAZOLAM 1 MG PO TABS: 1.0000 mg | ORAL_TABLET | Freq: Three times a day (TID) | ORAL | Status: DC | PRN

## 2014-05-02 NOTE — Telephone Encounter (Signed)
Faxed script back to [facility]...lmb 

## 2014-05-02 NOTE — Telephone Encounter (Signed)
Dr. Ronnald Ramp this is one of Dr. Asa Lente pt & she is out of the office for 2 weeks. Will be sending her controlled rx's to md's for approval.../lmb

## 2014-05-02 NOTE — Addendum Note (Signed)
Addended by: Janith Lima on: 05/02/2014 11:36 AM   Modules accepted: Orders

## 2014-05-02 NOTE — Telephone Encounter (Signed)
MD out of office pls advise on refill.../lmb 

## 2014-06-03 ENCOUNTER — Other Ambulatory Visit: Payer: Self-pay | Admitting: Internal Medicine

## 2014-06-06 ENCOUNTER — Other Ambulatory Visit: Payer: Self-pay | Admitting: Internal Medicine

## 2014-06-14 DIAGNOSIS — Z01419 Encounter for gynecological examination (general) (routine) without abnormal findings: Secondary | ICD-10-CM | POA: Diagnosis not present

## 2014-08-11 DIAGNOSIS — N390 Urinary tract infection, site not specified: Secondary | ICD-10-CM | POA: Diagnosis not present

## 2014-09-05 ENCOUNTER — Other Ambulatory Visit: Payer: Self-pay | Admitting: Internal Medicine

## 2014-12-03 ENCOUNTER — Other Ambulatory Visit: Payer: Self-pay | Admitting: Internal Medicine

## 2015-01-03 DIAGNOSIS — H524 Presbyopia: Secondary | ICD-10-CM | POA: Diagnosis not present

## 2015-01-03 DIAGNOSIS — Z961 Presence of intraocular lens: Secondary | ICD-10-CM | POA: Diagnosis not present

## 2015-01-03 DIAGNOSIS — H26493 Other secondary cataract, bilateral: Secondary | ICD-10-CM | POA: Diagnosis not present

## 2015-02-21 DIAGNOSIS — M7751 Other enthesopathy of right foot: Secondary | ICD-10-CM | POA: Diagnosis not present

## 2015-02-21 DIAGNOSIS — Z96661 Presence of right artificial ankle joint: Secondary | ICD-10-CM | POA: Diagnosis not present

## 2015-02-21 DIAGNOSIS — M25571 Pain in right ankle and joints of right foot: Secondary | ICD-10-CM | POA: Diagnosis not present

## 2015-03-01 DIAGNOSIS — H524 Presbyopia: Secondary | ICD-10-CM | POA: Diagnosis not present

## 2015-03-01 DIAGNOSIS — H26492 Other secondary cataract, left eye: Secondary | ICD-10-CM | POA: Diagnosis not present

## 2015-03-01 DIAGNOSIS — H26493 Other secondary cataract, bilateral: Secondary | ICD-10-CM | POA: Diagnosis not present

## 2015-03-09 ENCOUNTER — Other Ambulatory Visit: Payer: Self-pay | Admitting: Internal Medicine

## 2015-03-12 DIAGNOSIS — H26491 Other secondary cataract, right eye: Secondary | ICD-10-CM | POA: Diagnosis not present

## 2015-03-13 DIAGNOSIS — M810 Age-related osteoporosis without current pathological fracture: Secondary | ICD-10-CM | POA: Diagnosis not present

## 2015-03-13 DIAGNOSIS — Z803 Family history of malignant neoplasm of breast: Secondary | ICD-10-CM | POA: Diagnosis not present

## 2015-03-13 DIAGNOSIS — Z1231 Encounter for screening mammogram for malignant neoplasm of breast: Secondary | ICD-10-CM | POA: Diagnosis not present

## 2015-03-13 DIAGNOSIS — Z780 Asymptomatic menopausal state: Secondary | ICD-10-CM | POA: Diagnosis not present

## 2015-03-13 LAB — HM DEXA SCAN: HM Dexa Scan: 0.5

## 2015-03-14 ENCOUNTER — Encounter: Payer: Self-pay | Admitting: Internal Medicine

## 2015-03-14 ENCOUNTER — Ambulatory Visit (INDEPENDENT_AMBULATORY_CARE_PROVIDER_SITE_OTHER): Payer: Medicare Other | Admitting: Internal Medicine

## 2015-03-14 ENCOUNTER — Telehealth: Payer: Self-pay

## 2015-03-14 VITALS — BP 136/74 | HR 64 | Temp 98.0°F | Resp 18 | Wt 193.0 lb

## 2015-03-14 DIAGNOSIS — Z1211 Encounter for screening for malignant neoplasm of colon: Secondary | ICD-10-CM

## 2015-03-14 DIAGNOSIS — E785 Hyperlipidemia, unspecified: Secondary | ICD-10-CM | POA: Diagnosis not present

## 2015-03-14 DIAGNOSIS — F341 Dysthymic disorder: Secondary | ICD-10-CM | POA: Diagnosis not present

## 2015-03-14 DIAGNOSIS — K219 Gastro-esophageal reflux disease without esophagitis: Secondary | ICD-10-CM

## 2015-03-14 DIAGNOSIS — R739 Hyperglycemia, unspecified: Secondary | ICD-10-CM | POA: Diagnosis not present

## 2015-03-14 DIAGNOSIS — Z23 Encounter for immunization: Secondary | ICD-10-CM | POA: Diagnosis not present

## 2015-03-14 MED ORDER — ATORVASTATIN CALCIUM 20 MG PO TABS: 20.0000 mg | ORAL_TABLET | Freq: Every day | ORAL | Status: DC

## 2015-03-14 NOTE — Progress Notes (Signed)
Pre visit review using our clinic review tool, if applicable. No additional management support is needed unless otherwise documented below in the visit note. 

## 2015-03-14 NOTE — Assessment & Plan Note (Signed)
A1c

## 2015-03-14 NOTE — Progress Notes (Signed)
   Subjective:    Patient ID: Julie [name], female    DOB: [birth year], 69 y.o.   MRN: [id number]  HPI The patient is here to assess status of active health conditions.  PMH, FH, & Social History reviewed & updated.No change in [place] as recorded.  She is a widow and eats out often &  frozen foods at home. She has a moderate intake of fried foods and red meat. She has no regular exercise program. She will work in the yard an hour 2 times a week. She has no associated cardiopulmonary symptoms.  She's been compliant with her statin. She has no adverse effects related to this. Specifically she denies myalgias or memory issues.  She does have some postural symptoms described as some vertigo when she sits back up after leaning over. She also has nocturia 2 times a night.  She has decreased her alprazolam to 1 during the day as needed and 2 at bedtime. She tried to stop it completely but had withdrawal symptoms.  Her GERD is asymptomatic. She is due for colonoscopy; SOC discussed.   Review of Systems Chest pain, palpitations, tachycardia, exertional dyspnea, paroxysmal nocturnal dyspnea, claudication or edema are absent. No unexplained weight loss, abdominal pain, significant dyspepsia, dysphagia, melena, rectal bleeding, or persistently small caliber stools. Dysuria, pyuria, hematuria, frequency,  or polyuria are denied. Change in hair, skin, nails denied. No bowel changes of constipation or diarrhea. No intolerance to heat or cold.     Objective:   Physical Exam Pertinent or positive findings include: She has bilateral ptosis. There is crepitus in the knees, left greater than right. She has fusiform enlargement of the right ankle. There is a well-healed operative scar over the right ankle.  General appearance :adequately nourished; in no distress.  Eyes: No conjunctival inflammation or scleral icterus is present.  Oral exam:  Lips and gums are healthy appearing.There is no oropharyngeal  erythema or exudate noted. Dental hygiene is good.  Heart:  Normal rate and regular rhythm. S1 and S2 normal without gallop, murmur, click, rub or other extra sounds    Lungs:Chest clear to auscultation; no wheezes, rhonchi,rales ,or rubs present.No increased work of breathing.   Abdomen: bowel sounds normal, soft and non-tender without masses, organomegaly or hernias noted.  No guarding or rebound.   Vascular : all pulses equal ; no bruits present.  Skin:Warm & dry.  Intact without suspicious lesions or rashes ; no tenting or jaundice   Lymphatic: No lymphadenopathy is noted about the head, neck, axilla.   Neuro: Strength, tone & DTRs normal.        Assessment & Plan:  See Current Assessment & Plan in Problem List under specific Diagnosis

## 2015-03-14 NOTE — Addendum Note (Signed)
Addended by: Lowella Dandy on: 03/14/2015 05:20 PM   Modules accepted: Orders

## 2015-03-14 NOTE — Patient Instructions (Addendum)
  Your next office appointment will be determined based upon review of your pending labs in am  Those written interpretation of the lab results and instructions will be transmitted to you by mail for your records.  Critical results will be called.   Followup as needed for any active or acute issue. Please report any significant change in your symptoms.  Colonoscopy due as we discussed

## 2015-03-14 NOTE — Assessment & Plan Note (Signed)
CBC

## 2015-03-14 NOTE — Assessment & Plan Note (Signed)
Risk associated with regular use of alprazolam discussed; weaning recommended

## 2015-03-14 NOTE — Telephone Encounter (Signed)
Saw pt before she left during her visit today. Pt needed to cancel up coming CPE appointment but MD was able to complete all that was needed during the visit today.

## 2015-03-14 NOTE — Assessment & Plan Note (Signed)
Lipids, LFTs, TSH  

## 2015-03-15 ENCOUNTER — Other Ambulatory Visit (INDEPENDENT_AMBULATORY_CARE_PROVIDER_SITE_OTHER): Payer: Medicare Other

## 2015-03-15 DIAGNOSIS — K219 Gastro-esophageal reflux disease without esophagitis: Secondary | ICD-10-CM

## 2015-03-15 DIAGNOSIS — E785 Hyperlipidemia, unspecified: Secondary | ICD-10-CM

## 2015-03-15 DIAGNOSIS — R739 Hyperglycemia, unspecified: Secondary | ICD-10-CM

## 2015-03-15 LAB — CBC WITH DIFFERENTIAL/PLATELET
BASOS ABS: 0 10*3/uL (ref 0.0–0.1)
Basophils Relative: 0.4 % (ref 0.0–3.0)
Eosinophils Absolute: 0.1 10*3/uL (ref 0.0–0.7)
Eosinophils Relative: 1.4 % (ref 0.0–5.0)
HEMATOCRIT: 42.1 % (ref 36.0–46.0)
Hemoglobin: 14.1 g/dL (ref 12.0–15.0)
LYMPHS PCT: 19.2 % (ref 12.0–46.0)
Lymphs Abs: 1.9 10*3/uL (ref 0.7–4.0)
MCHC: 33.5 g/dL (ref 30.0–36.0)
MCV: 87.4 fl (ref 78.0–100.0)
MONOS PCT: 4.2 % (ref 3.0–12.0)
Monocytes Absolute: 0.4 10*3/uL (ref 0.1–1.0)
Neutro Abs: 7.6 10*3/uL (ref 1.4–7.7)
Neutrophils Relative %: 74.8 % (ref 43.0–77.0)
PLATELETS: 248 10*3/uL (ref 150.0–400.0)
RBC: 4.82 Mil/uL (ref 3.87–5.11)
RDW: 13 % (ref 11.5–15.5)
WBC: 10.2 10*3/uL (ref 4.0–10.5)

## 2015-03-15 LAB — LIPID PANEL
CHOL/HDL RATIO: 2
CHOLESTEROL: 128 mg/dL (ref 0–200)
HDL: 54.4 mg/dL (ref >39.00)
LDL CALC: 50 mg/dL (ref 0–99)
NonHDL: 74.06
TRIGLYCERIDES: 121 mg/dL (ref 0.0–149.0)
VLDL: 24.2 mg/dL (ref 0.0–40.0)

## 2015-03-15 LAB — HEPATIC FUNCTION PANEL
ALT: 17 U/L (ref 0–35)
AST: 17 U/L (ref 0–37)
Albumin: 4.2 g/dL (ref 3.5–5.2)
Alkaline Phosphatase: 61 U/L (ref 39–117)
BILIRUBIN DIRECT: 0.1 mg/dL (ref 0.0–0.3)
BILIRUBIN TOTAL: 0.6 mg/dL (ref 0.2–1.2)
TOTAL PROTEIN: 7.5 g/dL (ref 6.0–8.3)

## 2015-03-15 LAB — BASIC METABOLIC PANEL
BUN: 14 mg/dL (ref 6–23)
CALCIUM: 9.5 mg/dL (ref 8.4–10.5)
CHLORIDE: 106 meq/L (ref 96–112)
CO2: 27 mEq/L (ref 19–32)
Creatinine, Ser: 1.1 mg/dL (ref 0.40–1.20)
GFR: 52.36 mL/min — ABNORMAL LOW (ref >60.00)
Glucose, Bld: 114 mg/dL — ABNORMAL HIGH (ref 70–99)
Potassium: 4.1 mEq/L (ref 3.5–5.1)
Sodium: 143 mEq/L (ref 135–145)

## 2015-03-15 LAB — HEMOGLOBIN A1C: HEMOGLOBIN A1C: 5.8 % (ref 4.6–6.5)

## 2015-03-15 LAB — TSH: TSH: 2.63 u[IU]/mL (ref 0.35–4.50)

## 2015-03-21 ENCOUNTER — Encounter: Payer: Self-pay | Admitting: Gastroenterology

## 2015-04-02 ENCOUNTER — Encounter: Payer: Self-pay | Admitting: Internal Medicine

## 2015-04-05 ENCOUNTER — Encounter: Payer: Medicare Other | Admitting: Internal Medicine

## 2015-04-25 DIAGNOSIS — Z85820 Personal history of malignant melanoma of skin: Secondary | ICD-10-CM | POA: Diagnosis not present

## 2015-04-25 DIAGNOSIS — L821 Other seborrheic keratosis: Secondary | ICD-10-CM | POA: Diagnosis not present

## 2015-04-25 DIAGNOSIS — L738 Other specified follicular disorders: Secondary | ICD-10-CM | POA: Diagnosis not present

## 2015-04-25 DIAGNOSIS — L820 Inflamed seborrheic keratosis: Secondary | ICD-10-CM | POA: Diagnosis not present

## 2015-04-25 DIAGNOSIS — D2272 Melanocytic nevi of left lower limb, including hip: Secondary | ICD-10-CM | POA: Diagnosis not present

## 2015-06-05 ENCOUNTER — Encounter: Payer: Medicare Other | Admitting: Gastroenterology

## 2015-06-08 ENCOUNTER — Other Ambulatory Visit: Payer: Self-pay | Admitting: Internal Medicine

## 2015-06-12 ENCOUNTER — Ambulatory Visit (AMBULATORY_SURGERY_CENTER): Payer: Self-pay

## 2015-06-12 VITALS — Ht 67.75 in | Wt 196.4 lb

## 2015-06-12 DIAGNOSIS — Z1211 Encounter for screening for malignant neoplasm of colon: Secondary | ICD-10-CM

## 2015-06-12 MED ORDER — NA SULFATE-K SULFATE-MG SULF 17.5-3.13-1.6 GM/177ML PO SOLN: ORAL | Status: DC

## 2015-06-12 NOTE — Progress Notes (Signed)
Per pt, no allergies to soy or egg products.Pt not taking any weight loss meds or using  O2 at home. 

## 2015-06-19 DIAGNOSIS — Z01419 Encounter for gynecological examination (general) (routine) without abnormal findings: Secondary | ICD-10-CM | POA: Diagnosis not present

## 2015-06-19 DIAGNOSIS — Z6829 Body mass index (BMI) 29.0-29.9, adult: Secondary | ICD-10-CM | POA: Diagnosis not present

## 2015-06-25 ENCOUNTER — Ambulatory Visit (AMBULATORY_SURGERY_CENTER): Payer: Medicare Other | Admitting: Gastroenterology

## 2015-06-25 ENCOUNTER — Encounter: Payer: Self-pay | Admitting: Gastroenterology

## 2015-06-25 VITALS — BP 128/75 | HR 69 | Temp 98.3°F | Resp 20 | Ht 67.75 in | Wt 196.0 lb

## 2015-06-25 DIAGNOSIS — D125 Benign neoplasm of sigmoid colon: Secondary | ICD-10-CM

## 2015-06-25 DIAGNOSIS — K635 Polyp of colon: Secondary | ICD-10-CM

## 2015-06-25 DIAGNOSIS — Z1211 Encounter for screening for malignant neoplasm of colon: Secondary | ICD-10-CM | POA: Diagnosis not present

## 2015-06-25 MED ORDER — SODIUM CHLORIDE 0.9 % IV SOLN: 500.0000 mL | INTRAVENOUS | Status: DC

## 2015-06-25 NOTE — Progress Notes (Signed)
Report to PACU, RN, vss, BBS= Clear.  

## 2015-06-25 NOTE — Op Note (Signed)
[facility]  [address] [place], [address]   COLONOSCOPY PROCEDURE REPORT  PATIENT: Julie, [name]  MR#: [id number] BIRTHDATE: 18-Mar-1946 , 27  yrs. old GENDER: female ENDOSCOPIST: Harl Bowie, MD REFERRED [id number] Nafziger, NP PROCEDURE DATE:  06/25/2015 PROCEDURE:   Colonoscopy, screening First Screening Colonoscopy - Avg.  risk and is 50 yrs.  old or older - No.  Prior Negative Screening - Now for repeat screening. 10 or more years since last screening  History of Adenoma - Now for follow-up colonoscopy & has been > or = to 3 yrs.  N/A  Polyps removed today? Yes ASA CLASS:   Class II INDICATIONS:Screening for colonic neoplasia and Colorectal Neoplasm Risk Assessment for this procedure is average risk. MEDICATIONS: Propofol 550 IV  DESCRIPTION OF PROCEDURE:   After the risks benefits and alternatives of the procedure were thoroughly explained, informed consent was obtained.  The digital rectal exam revealed no abnormalities of the rectum.   The [id number]  endoscope was introduced through the anus and advanced to the cecum, which was identified by both the appendix and ileocecal valve. No adverse events experienced.   The quality of the prep was good.  The instrument was then slowly withdrawn as the colon was fully examined. Estimated blood loss is zero unless otherwise noted in this procedure report.   COLON FINDINGS: A sessile polyp ranging between 3-48mm in size was found in the sigmoid colon.  A polypectomy was performed with a cold snare.  The resection was complete, the polyp tissue was completely retrieved and sent to histology.  Retroflexed views revealed no abnormalities. The time to cecum = 8.1 Withdrawal time = 28.7   The scope was withdrawn and the procedure completed. COMPLICATIONS: There were no immediate complications.  ENDOSCOPIC IMPRESSION: Sessile polyp ranging between 3-47mm in size was found in the sigmoid colon;  polypectomy was performed with a cold snare  RECOMMENDATIONS: If the polyp(s) removed today are proven to be adenomatous (pre-cancerous) polyps, you will need a repeat colonoscopy in 5 years.  Otherwise you should continue to follow colorectal cancer screening guidelines for "routine risk" patients with colonoscopy in 10 years.  You will receive a letter within 1-2 weeks with the results of your biopsy as well as final recommendations.  Please call my office if you have not received a letter after 3 weeks.  eSigned:  Harl Bowie, MD 06/25/2015 3:11 PM

## 2015-06-25 NOTE — Progress Notes (Signed)
Called to room to assist during endoscopic procedure.  Patient ID and intended procedure confirmed with present staff. Received instructions for my participation in the procedure from the performing physician.  

## 2015-06-25 NOTE — Progress Notes (Signed)
Patient denies any allergies to eggs or soy. 

## 2015-06-25 NOTE — Patient Instructions (Signed)
Discharge instructions given. Handout on polyps. Resume previous medications. YOU HAD AN ENDOSCOPIC PROCEDURE TODAY AT THE  ENDOSCOPY CENTER:   Refer to the procedure report that was given to you for any specific questions about what was found during the examination.  If the procedure report does not answer your questions, please call your gastroenterologist to clarify.  If you requested that your care partner not be given the details of your procedure findings, then the procedure report has been included in a sealed envelope for you to review at your convenience later.  YOU SHOULD EXPECT: Some feelings of bloating in the abdomen. Passage of more gas than usual.  Walking can help get rid of the air that was put into your GI tract during the procedure and reduce the bloating. If you had a lower endoscopy (such as a colonoscopy or flexible sigmoidoscopy) you may notice spotting of blood in your stool or on the toilet paper. If you underwent a bowel prep for your procedure, you may not have a normal bowel movement for a few days.  Please Note:  You might notice some irritation and congestion in your nose or some drainage.  This is from the oxygen used during your procedure.  There is no need for concern and it should clear up in a day or so.  SYMPTOMS TO REPORT IMMEDIATELY:   Following lower endoscopy (colonoscopy or flexible sigmoidoscopy):  Excessive amounts of blood in the stool  Significant tenderness or worsening of abdominal pains  Swelling of the abdomen that is new, acute  Fever of 100F or higher   For urgent or emergent issues, a gastroenterologist can be reached at any hour by calling [phone].   DIET: Your first meal following the procedure should be a small meal and then it is ok to progress to your normal diet. Heavy or fried foods are harder to digest and may make you feel nauseous or bloated.  Likewise, meals heavy in dairy and vegetables can increase bloating.  Drink  plenty of fluids but you should avoid alcoholic beverages for 24 hours.  ACTIVITY:  You should plan to take it easy for the rest of today and you should NOT DRIVE or use heavy machinery until tomorrow (because of the sedation medicines used during the test).    FOLLOW UP: Our staff will call the number listed on your records the next business day following your procedure to check on you and address any questions or concerns that you may have regarding the information given to you following your procedure. If we do not reach you, we will leave a message.  However, if you are feeling well and you are not experiencing any problems, there is no need to return our call.  We will assume that you have returned to your regular daily activities without incident.  If any biopsies were taken you will be contacted by phone or by letter within the next 1-3 weeks.  Please call us at [phone] if you have not heard about the biopsies in 3 weeks.    SIGNATURES/CONFIDENTIALITY: You and/or your care partner have signed paperwork which will be entered into your electronic medical record.  These signatures attest to the fact that that the information above on your After Visit Summary has been reviewed and is understood.  Full responsibility of the confidentiality of this discharge information lies with you and/or your care-partner. 

## 2015-06-26 ENCOUNTER — Telehealth: Payer: Self-pay

## 2015-06-26 NOTE — Telephone Encounter (Signed)
  Follow up Call-  Call back number 06/25/2015  Post procedure Call Back phone  # [phone]  Permission to leave phone message Yes     Patient questions:  Do you have a fever, pain , or abdominal swelling? No. Pain Score  0 *  Have you tolerated food without any problems? Yes.    Have you been able to return to your normal activities? Yes.    Do you have any questions about your discharge instructions: Diet   No. Medications  No. Follow up visit  No.  Do you have questions or concerns about your Care? No.  Actions: * If pain score is 4 or above: No action needed, pain <4.

## 2015-07-03 ENCOUNTER — Encounter: Payer: Self-pay | Admitting: Gastroenterology

## 2015-07-31 ENCOUNTER — Ambulatory Visit (INDEPENDENT_AMBULATORY_CARE_PROVIDER_SITE_OTHER): Payer: Medicare Other | Admitting: Adult Health

## 2015-07-31 ENCOUNTER — Encounter: Payer: Self-pay | Admitting: Adult Health

## 2015-07-31 VITALS — BP 132/82 | HR 74 | Temp 97.9°F | Ht 67.75 in | Wt 194.0 lb

## 2015-07-31 DIAGNOSIS — Z7189 Other specified counseling: Secondary | ICD-10-CM

## 2015-07-31 DIAGNOSIS — R059 Cough, unspecified: Secondary | ICD-10-CM

## 2015-07-31 DIAGNOSIS — R05 Cough: Secondary | ICD-10-CM

## 2015-07-31 DIAGNOSIS — Z7689 Persons encountering health services in other specified circumstances: Secondary | ICD-10-CM

## 2015-07-31 MED ORDER — METHYLPREDNISOLONE 4 MG PO TBPK
ORAL_TABLET | ORAL | Status: DC
Start: 2015-07-31 — End: 2015-08-29

## 2015-07-31 NOTE — Progress Notes (Signed)
HPI:  Julie [name] is here to establish care. She is a pleasant caucasian female who  has a past medical history of Arthritis; Anxiety; Depression; GERD (gastroesophageal reflux disease); Hyperglycemia; EXOGENOUS OBESITY; Dyslipidemia; ARTHRITIS, TRAUMATIC, ANKLE; ALLERGIC RHINITIS; Melanoma in situ of left lower leg ([place]); Cataract; and Hemorrhoids.  Last PCP and physical: 03/14/2015 with MD Linna Darner   Immunizations:UTD  Diet: Eats out a lot and frozen dinners.  Exercise: Does not exercise. Works out in the yard.  Colonoscopy: 06/2015 - Every 10 years  Dexa: 03/13/2015  Mammogram: 02/2015 - normal ( do not have these results).  Has the following chronic problems that require follow up and concerns today:  Depression - She feels like her depression is well controlled with her Wellbutrin.   ROS negative for unless reported above: fevers, chills,feeling poorly, unintentional weight loss, hearing or vision loss, chest pain, palpitations, leg claudication, struggling to breath,Not feeling congested in the chest, no orthopenia, no cough,no wheezing, normal appetite, no soft tissue swelling, no hemoptysis, melena, hematochezia, hematuria, falls, loc, si, or thoughts of self harm.   Past Medical History  Diagnosis Date  . Arthritis   . Anxiety     chronic BZs  . Depression   . GERD (gastroesophageal reflux disease)   . Hyperglycemia   . GERD   . EXOGENOUS OBESITY   . Dyslipidemia   . ARTHRITIS, TRAUMATIC, ANKLE   . ALLERGIC RHINITIS   . Melanoma in situ of left lower leg ([place])   . Cataract   . Hemorrhoids     Past Surgical History  Procedure Laterality Date  . Spine surgery      laminotomy with decompression  . Joint replacement  06/2010    R ankle  . Abdominal hysterectomy  10/1978  . Fracture surgery      right ankle  . Removal multiple cyst breast left and right    . Sling anterior repair  12/1999  . Arthroscopy left knee  05/2002, 04/2010  . C. surgery history and  scan sheet    . Cholecystectomy  09/2004  . Breast surgery  11/1988    Benign mass (R) breast  . Toe exostosis  10/08    Bil  . Cataract surgery      (L) eye 09/06/12 & (R) eye 10/11/12  . Cysto sling  10/2006    3 times  . Ankle arthroscopy Right 10/19/12    deorio  . Mass excision      under right arm    Family History  Problem Relation Age of Onset  . Hyperlipidemia Father   . Coronary artery disease Father     cabg 70yo  . Leukemia Father   . Hypertension Father   . Heart attack Brother 43    stents x 2 (37 & 45 yo)  . Diabetes Brother   . Diabetes Mother   . Hyperlipidemia Mother   . Hypertension Mother   . Colon cancer Neg Hx     Social History   Social History  . Marital Status: Married    Spouse Name: N/A  . Number of Children: N/A  . Years of Education: N/A   Social History Main Topics  . Smoking status: Never Smoker   . Smokeless tobacco: Never Used  . Alcohol Use: No  . Drug Use: No  . Sexual Activity: Not Asked   Other Topics Concern  . None   Social History Narrative   Widowed (spouse - Ron of >44years prior to  death)   widowed 03/2011 - 2 grown supportive dtrs, 5 g-kis (3 are triplets)     Current outpatient prescriptions:  .  ALPRAZolam (XANAX) 1 MG tablet, Take 1 tablet (1 mg total) by mouth 3 (three) times daily as needed for anxiety. (Patient taking differently: Take 1 mg by mouth 4 (four) times daily as needed for anxiety. ), Disp: 120 tablet, Rfl: 0 .  aspirin 81 MG tablet, Take 81 mg by mouth daily.  , Disp: , Rfl:  .  atorvastatin (LIPITOR) 20 MG tablet, Take 1 tablet (20 mg total) by mouth daily., Disp: 30 tablet, Rfl: 11 .  Biotin 2500 MCG CAPS, Take by mouth daily., Disp: , Rfl:  .  buPROPion (WELLBUTRIN XL) 300 MG 24 hr tablet, Take 1 tablet (300 mg total) by mouth daily., Disp: 90 tablet, Rfl: 1 .  Calcium Carbonate-Vitamin D (CALTRATE 600+D) 600-400 MG-UNIT per tablet, Take 1 tablet by mouth daily.  , Disp: , Rfl:  .  Multiple Vitamin  (MULTIVITAMIN) tablet, Take 1 tablet by mouth daily.  , Disp: , Rfl:  .  Omega-3 Fatty Acids (FISH OIL) 1000 MG CAPS, Take by mouth 2 (two) times daily., Disp: , Rfl:  .  RA ALLERGY RELIEF 180 MG tablet, take 1 tablet by mouth once daily, Disp: 30 tablet, Rfl: 5  EXAM:  Filed Vitals:   07/31/15 0959  BP: 132/82  Pulse: 74  Temp: 97.9 F (36.6 C)    Body mass index is 29.71 kg/(m^2).  GENERAL: vitals reviewed and listed above, alert, oriented, appears well hydrated and in no acute distress  HEENT: atraumatic, conjunttiva clear, no obvious abnormalities on inspection of external nose and ears  NECK: Neck is soft and supple without masses, no adenopathy or thyromegaly, trachea midline, no JVD. Normal range of motion.   LUNGS: clear to auscultation bilaterally, no wheezes, rales or rhonchi, good air movement  CV: Regular rate and rhythm, normal S1/S2, no audible murmurs, gallops, or rubs. No carotid bruit and no peripheral edema.   MS: moves all extremities without noticeable abnormality. No edema noted  Abd: soft/nontender/nondistended/normal bowel sounds   Skin: warm and dry, no rash   Extremities: No clubbing, cyanosis, or edema. Capillary refill is WNL. Pulses intact bilaterally in upper and lower extremities.   Neuro: CN II-XII intact, sensation and reflexes normal throughout, 5/5 muscle strength in bilateral upper and lower extremities. Normal finger to nose. Normal rapid alternating movements. Normal romberg. No pronator drift.   PSYCH: pleasant and cooperative, no obvious depression or anxiety  ASSESSMENT AND PLAN:  1. Encounter to establish care - Follow up for CPE in September - Follow up sooner if needed  2. Cough - methylPREDNISolone (MEDROL DOSEPAK) 4 MG TBPK tablet; Take as directed  Dispense: 21 tablet; Refill: 0  No diagnosis found. -We reviewed the PMH, PSH, FH, SH, Meds and Allergies. -We provided refills for any medications we will prescribe as  needed. -We addressed current concerns per orders and patient instructions. -We have asked for records for pertinent exams, studies, vaccines and notes from previous providers. -We have advised patient to follow up per instructions below.   -Patient advised to return or notify a provider immediately if symptoms worsen or persist or new concerns arise.  There are no Patient Instructions on file for this visit.   [employer]

## 2015-07-31 NOTE — Patient Instructions (Signed)
It was great meeting you today!  I have sent in a prescription for prednisone, take this as directed.   I cannot stress enough how important it is that you start working on diet and exercise.   Follow up with me in September for your next physical, or sooner if needed.

## 2015-07-31 NOTE — Progress Notes (Signed)
Pre visit review using our clinic review tool, if applicable. No additional management support is needed unless otherwise documented below in the visit note. 

## 2015-08-29 ENCOUNTER — Ambulatory Visit (INDEPENDENT_AMBULATORY_CARE_PROVIDER_SITE_OTHER): Payer: Medicare Other | Admitting: Internal Medicine

## 2015-08-29 ENCOUNTER — Encounter: Payer: Self-pay | Admitting: Internal Medicine

## 2015-08-29 VITALS — BP 160/86 | HR 97 | Temp 98.2°F | Ht 67.75 in

## 2015-08-29 DIAGNOSIS — J069 Acute upper respiratory infection, unspecified: Secondary | ICD-10-CM

## 2015-08-29 DIAGNOSIS — R05 Cough: Secondary | ICD-10-CM

## 2015-08-29 DIAGNOSIS — R058 Other specified cough: Secondary | ICD-10-CM

## 2015-08-29 DIAGNOSIS — B9789 Other viral agents as the cause of diseases classified elsewhere: Secondary | ICD-10-CM

## 2015-08-29 DIAGNOSIS — J40 Bronchitis, not specified as acute or chronic: Secondary | ICD-10-CM

## 2015-08-29 DIAGNOSIS — J209 Acute bronchitis, unspecified: Secondary | ICD-10-CM | POA: Diagnosis not present

## 2015-08-29 MED ORDER — BENZONATATE 100 MG PO CAPS: 100.0000 mg | ORAL_CAPSULE | Freq: Three times a day (TID) | ORAL | Status: DC | PRN

## 2015-08-29 MED ORDER — ALBUTEROL SULFATE (2.5 MG/3ML) 0.083% IN NEBU
2.5000 mg | INHALATION_SOLUTION | Freq: Once | RESPIRATORY_TRACT | Status: AC
  Administered 2015-08-29: 2.5 mg via RESPIRATORY_TRACT

## 2015-08-29 MED ORDER — PREDNISONE 20 MG PO TABS: ORAL_TABLET | ORAL | Status: DC

## 2015-08-29 NOTE — Progress Notes (Signed)
Pre visit review using our clinic review tool, if applicable. No additional management support is needed unless otherwise documented below in the visit note.   Chief Complaint  Patient presents with  . Cough  . Nasal Congestion    HPI: Patient Julie [name]  comes in today for SDA for  new problem evaluation. Onset  Since  Sat 3-4 days  Ago    Tickle  in throat  Onset  With exposures fever and cough . Cough episodes over the past  Weeks and then other for the last 3 months. 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 No hemoptysis. Doesn't take hydrocodone because causes itching and needs an extra medicine for itching if she uses that. She's supposed to go on a cruise next weekend is concerned about not being improved. No fever and chills now. .  ROS: See pertinent positives and negatives per HPI.  Past Medical History  Diagnosis Date  . Arthritis   . Anxiety     chronic BZs  . Depression   . GERD (gastroesophageal reflux disease)   . Hyperglycemia   . EXOGENOUS OBESITY   . Dyslipidemia   . ARTHRITIS, TRAUMATIC, ANKLE   . ALLERGIC RHINITIS   . Melanoma in situ of left lower leg ([place])   . Cataract   . Hemorrhoids     Family History  Problem Relation Age of Onset  . Hyperlipidemia Father   . Coronary artery disease Father     cabg 70yo  . Leukemia Father   . Hypertension Father   . Heart attack Brother 69    stents x 2 (56 & 38 yo)  . Diabetes Brother   . Diabetes Mother   . Hyperlipidemia Mother   . Hypertension Mother   . Colon cancer Neg Hx     Social History   Social History  . Marital Status: Married    Spouse Name: N/A  . Number of Children: N/A  . Years of Education: N/A   Social History Main Topics  . Smoking  status: Never Smoker   . Smokeless tobacco: Never Used  . Alcohol Use: No  . Drug Use: No  . Sexual Activity: Not on file   Other Topics Concern  . Not on file   Social History Narrative   Widowed (spouse - Ron of >44years prior to death)   widowed 2011-03-31 - 2 grown supportive dtrs, 5 g-kis (3 are triplets)    Outpatient Prescriptions Prior to Visit  Medication Sig Dispense Refill  . ALPRAZolam (XANAX) 1 MG tablet Take 1 tablet (1 mg total) by mouth 3 (three) times daily as needed for anxiety. (Patient taking differently: Take 1 mg by mouth 4 (four) times daily as needed for anxiety. ) 120 tablet 0  . aspirin 81 MG tablet Take 81 mg by mouth daily.      Marland Kitchen atorvastatin (LIPITOR) 20 MG tablet Take 1 tablet (20 mg total) by mouth daily. 30 tablet 11  . Biotin 2500 MCG CAPS Take by mouth daily.    Marland Kitchen buPROPion (WELLBUTRIN XL) 300 MG 24 hr tablet Take 1 tablet (300 mg total) by mouth daily. 90 tablet 1  . Calcium Carbonate-Vitamin D (CALTRATE 600+D) 600-400 MG-UNIT per tablet Take 1 tablet by  mouth daily.      . Multiple Vitamin (MULTIVITAMIN) tablet Take 1 tablet by mouth daily.      . Omega-3 Fatty Acids (FISH OIL) 1000 MG CAPS Take by mouth 2 (two) times daily.    Marland Kitchen RA ALLERGY RELIEF 180 MG tablet take 1 tablet by mouth once daily 30 tablet 5  . methylPREDNISolone (MEDROL DOSEPAK) 4 MG TBPK tablet Take as directed 21 tablet 0   No facility-administered medications prior to visit.     EXAM:  BP 160/86 mmHg  Pulse 97  Temp(Src) 98.2 F (36.8 C) (Oral)  Ht 5' 7.75" (1.721 m)  Wt   SpO2 97%  There is no weight on file to calculate BMI. WDWN in NAD  quiet respirations no stridor congested  somewhat hoarse. Non toxic . With recurring fits of tight bronchial cough. When she takes a deep breath she begins to cough HEENT: Normocephalic ;atraumatic , Eyes;  PERRL, EOMs  Full, lids and conjunctiva clear,,Ears: no deformities, canals nl, TM landmarks normal, Nose: no deformity or discharge  but in [place] congested;face nonnder Mouth : OP clear without lesion or edema . Neck: Supple without adenopathy or masses or bruits Chest:  Clear to A without wheezes rales or rhonchi but appears quite tight with decreased airflow. After nebulizer albuterol cough appears looser to me and less trauma spasmodic increased air flow but patient states it doesn't feel any different. CV:  S1-S2 no gallops or murmurs peripheral perfusion is normal Skin :nl perfusion and no acute rashes    ASSESSMENT AND PLAN:  Discussed the following assessment and plan:  Wheezy bronchitis - Plan: albuterol (PROVENTIL) (2.5 MG/3ML) 0.083% nebulizer solution 2.5 mg  Viral upper respiratory tract infection with cough - exposed to croup in child - Plan: albuterol (PROVENTIL) (2.5 MG/3ML) 0.083% nebulizer solution 2.5 mg  Recurrent cough  patient reports recurrent problems over the last few months. No history of asthma. No recent chest x-ray. But I do not suspect pneumonia at this time. If doesn't improve or gets alarm symptoms with fevers to get a chest x-ray follow-up. Still consider adding a bronchodilator. Try Tessalon Perles local measures 5 days prednisone.  -Patient advised to return or notify health care team  if symptoms worsen ,persist or new concerns arise. Total visit 18mins > 50% spent counseling and coordinating care as indicated in above note and in instructions to patient .   Patient Instructions  This is a viral respiratory infection  That causes cough . Poss croup virus  And body heals on its own .  No pneumonia sounds heard today  May last another week-10 days  but should be feeling better in 5 days . Cough med at night for comfort  . Hot warm liquids may soothe .Marland Kitchen  Avoid respiratory irritants  And continual use of cough drops Contact med care team if fever   Worsening after improvement  Shortness of breath in between coughing . Sometimes  Medications we use for asthma help this also consider chest  x ray if get fever or  Not getting better.. . Try  tessalon perles   And   Inhaler as needed .  Can also add 5 days prednisone  To see if helps the  Reactive airways .        Standley Brooking. Panosh M.D.

## 2015-08-29 NOTE — Patient Instructions (Addendum)
This is a viral respiratory infection  That causes cough . Poss croup virus  And body heals on its own .  No pneumonia sounds heard today  May last another week-10 days  but should be feeling better in 5 days . Cough med at night for comfort  . Hot warm liquids may soothe .Marland Kitchen  Avoid respiratory irritants  And continual use of cough drops Contact med care team if fever   Worsening after improvement  Shortness of breath in between coughing . Sometimes  Medications we use for asthma help this also consider chest x ray if get fever or  Not getting better.. . Try  tessalon perles   And   Inhaler as needed .  Can also add 5 days prednisone  To see if helps the  Reactive airways .

## 2015-09-19 DIAGNOSIS — M25579 Pain in unspecified ankle and joints of unspecified foot: Secondary | ICD-10-CM | POA: Diagnosis not present

## 2015-09-19 DIAGNOSIS — M19071 Primary osteoarthritis, right ankle and foot: Secondary | ICD-10-CM | POA: Diagnosis not present

## 2015-09-20 ENCOUNTER — Other Ambulatory Visit: Payer: Self-pay | Admitting: Adult Health

## 2015-09-21 DIAGNOSIS — Z96661 Presence of right artificial ankle joint: Secondary | ICD-10-CM | POA: Diagnosis not present

## 2015-09-21 DIAGNOSIS — G8918 Other acute postprocedural pain: Secondary | ICD-10-CM | POA: Diagnosis not present

## 2015-09-21 DIAGNOSIS — T8489XA Other specified complication of internal orthopedic prosthetic devices, implants and grafts, initial encounter: Secondary | ICD-10-CM | POA: Diagnosis not present

## 2015-09-21 DIAGNOSIS — M19071 Primary osteoarthritis, right ankle and foot: Secondary | ICD-10-CM | POA: Diagnosis not present

## 2015-09-21 DIAGNOSIS — M216X1 Other acquired deformities of right foot: Secondary | ICD-10-CM | POA: Diagnosis not present

## 2015-09-21 DIAGNOSIS — T84498A Other mechanical complication of other internal orthopedic devices, implants and grafts, initial encounter: Secondary | ICD-10-CM | POA: Diagnosis present

## 2015-09-21 DIAGNOSIS — M19079 Primary osteoarthritis, unspecified ankle and foot: Secondary | ICD-10-CM | POA: Diagnosis present

## 2015-09-21 NOTE — Telephone Encounter (Signed)
Ok to refill for one month  

## 2015-11-24 ENCOUNTER — Other Ambulatory Visit: Payer: Self-pay | Admitting: Internal Medicine

## 2015-11-26 NOTE — Telephone Encounter (Signed)
Ok to refill 90 pills with one refill

## 2015-12-19 DIAGNOSIS — Z96661 Presence of right artificial ankle joint: Secondary | ICD-10-CM | POA: Diagnosis not present

## 2015-12-19 DIAGNOSIS — Z471 Aftercare following joint replacement surgery: Secondary | ICD-10-CM | POA: Diagnosis not present

## 2016-02-13 DIAGNOSIS — H04123 Dry eye syndrome of bilateral lacrimal glands: Secondary | ICD-10-CM | POA: Diagnosis not present

## 2016-02-13 DIAGNOSIS — H524 Presbyopia: Secondary | ICD-10-CM | POA: Diagnosis not present

## 2016-02-13 DIAGNOSIS — H02835 Dermatochalasis of left lower eyelid: Secondary | ICD-10-CM | POA: Diagnosis not present

## 2016-02-13 DIAGNOSIS — Z961 Presence of intraocular lens: Secondary | ICD-10-CM | POA: Diagnosis not present

## 2016-02-13 DIAGNOSIS — H43813 Vitreous degeneration, bilateral: Secondary | ICD-10-CM | POA: Diagnosis not present

## 2016-02-13 DIAGNOSIS — H5213 Myopia, bilateral: Secondary | ICD-10-CM | POA: Diagnosis not present

## 2016-03-13 ENCOUNTER — Other Ambulatory Visit: Payer: Self-pay | Admitting: Internal Medicine

## 2016-03-13 ENCOUNTER — Other Ambulatory Visit: Payer: Self-pay | Admitting: Adult Health

## 2016-03-13 DIAGNOSIS — E785 Hyperlipidemia, unspecified: Secondary | ICD-10-CM

## 2016-03-21 ENCOUNTER — Other Ambulatory Visit (INDEPENDENT_AMBULATORY_CARE_PROVIDER_SITE_OTHER): Payer: Medicare Other

## 2016-03-21 DIAGNOSIS — Z1159 Encounter for screening for other viral diseases: Secondary | ICD-10-CM

## 2016-03-21 DIAGNOSIS — Z0000 Encounter for general adult medical examination without abnormal findings: Secondary | ICD-10-CM | POA: Diagnosis not present

## 2016-03-21 LAB — CBC WITH DIFFERENTIAL/PLATELET
Basophils Absolute: 0 10*3/uL (ref 0.0–0.1)
Basophils Relative: 0.6 % (ref 0.0–3.0)
EOS ABS: 0.2 10*3/uL (ref 0.0–0.7)
EOS PCT: 2 % (ref 0.0–5.0)
HCT: 40.5 % (ref 36.0–46.0)
HEMOGLOBIN: 13.6 g/dL (ref 12.0–15.0)
LYMPHS PCT: 23.6 % (ref 12.0–46.0)
Lymphs Abs: 1.9 10*3/uL (ref 0.7–4.0)
MCHC: 33.7 g/dL (ref 30.0–36.0)
MCV: 87.3 fl (ref 78.0–100.0)
MONO ABS: 0.4 10*3/uL (ref 0.1–1.0)
Monocytes Relative: 5.5 % (ref 3.0–12.0)
Neutro Abs: 5.4 10*3/uL (ref 1.4–7.7)
Neutrophils Relative %: 68.3 % (ref 43.0–77.0)
Platelets: 245 10*3/uL (ref 150.0–400.0)
RBC: 4.64 Mil/uL (ref 3.87–5.11)
RDW: 13.4 % (ref 11.5–15.5)
WBC: 7.9 10*3/uL (ref 4.0–10.5)

## 2016-03-21 LAB — POC URINALSYSI DIPSTICK (AUTOMATED)
Glucose, UA: NEGATIVE
Ketones, UA: NEGATIVE
Leukocytes, UA: NEGATIVE
Nitrite, UA: NEGATIVE
Spec Grav, UA: 1.025
Urobilinogen, UA: 0.2
pH, UA: 5.5

## 2016-03-21 LAB — LIPID PANEL
CHOL/HDL RATIO: 2
Cholesterol: 128 mg/dL (ref 0–200)
HDL: 51.8 mg/dL (ref >39.00)
LDL Cholesterol: 50 mg/dL (ref 0–99)
NonHDL: 76.24
TRIGLYCERIDES: 129 mg/dL (ref 0.0–149.0)
VLDL: 25.8 mg/dL (ref 0.0–40.0)

## 2016-03-21 LAB — HEPATIC FUNCTION PANEL
ALT: 21 U/L (ref 0–35)
AST: 21 U/L (ref 0–37)
Albumin: 4.2 g/dL (ref 3.5–5.2)
Alkaline Phosphatase: 65 U/L (ref 39–117)
BILIRUBIN DIRECT: 0.1 mg/dL (ref 0.0–0.3)
TOTAL PROTEIN: 7.3 g/dL (ref 6.0–8.3)
Total Bilirubin: 0.5 mg/dL (ref 0.2–1.2)

## 2016-03-21 LAB — BASIC METABOLIC PANEL
BUN: 14 mg/dL (ref 6–23)
CO2: 29 mEq/L (ref 19–32)
CREATININE: 0.95 mg/dL (ref 0.40–1.20)
Calcium: 9.1 mg/dL (ref 8.4–10.5)
Chloride: 104 mEq/L (ref 96–112)
GFR: 61.83 mL/min (ref >60.00)
Glucose, Bld: 129 mg/dL — ABNORMAL HIGH (ref 70–99)
POTASSIUM: 4.1 meq/L (ref 3.5–5.1)
Sodium: 140 mEq/L (ref 135–145)

## 2016-03-21 LAB — TSH: TSH: 3.1 u[IU]/mL (ref 0.35–4.50)

## 2016-03-22 LAB — HEPATITIS C ANTIBODY: HCV Ab: NEGATIVE

## 2016-03-26 DIAGNOSIS — Z1231 Encounter for screening mammogram for malignant neoplasm of breast: Secondary | ICD-10-CM | POA: Diagnosis not present

## 2016-03-27 ENCOUNTER — Encounter: Payer: Self-pay | Admitting: Adult Health

## 2016-03-27 ENCOUNTER — Ambulatory Visit (INDEPENDENT_AMBULATORY_CARE_PROVIDER_SITE_OTHER): Payer: Medicare Other | Admitting: Adult Health

## 2016-03-27 VITALS — BP 142/88 | Temp 98.6°F | Ht 67.75 in | Wt 194.6 lb

## 2016-03-27 DIAGNOSIS — F419 Anxiety disorder, unspecified: Secondary | ICD-10-CM

## 2016-03-27 DIAGNOSIS — I10 Essential (primary) hypertension: Secondary | ICD-10-CM | POA: Insufficient documentation

## 2016-03-27 DIAGNOSIS — Z23 Encounter for immunization: Secondary | ICD-10-CM

## 2016-03-27 DIAGNOSIS — E785 Hyperlipidemia, unspecified: Secondary | ICD-10-CM

## 2016-03-27 DIAGNOSIS — F329 Major depressive disorder, single episode, unspecified: Secondary | ICD-10-CM

## 2016-03-27 DIAGNOSIS — F418 Other specified anxiety disorders: Secondary | ICD-10-CM

## 2016-03-27 DIAGNOSIS — R739 Hyperglycemia, unspecified: Secondary | ICD-10-CM | POA: Diagnosis not present

## 2016-03-27 LAB — POCT GLYCOSYLATED HEMOGLOBIN (HGB A1C): Hemoglobin A1C: 5.6

## 2016-03-27 MED ORDER — BUPROPION HCL ER (XL) 300 MG PO TB24
300.0000 mg | ORAL_TABLET | Freq: Every day | ORAL | 1 refills | Status: DC
Start: 2016-03-27 — End: 2017-01-05

## 2016-03-27 MED ORDER — LISINOPRIL 5 MG PO TABS: 5.0000 mg | ORAL_TABLET | Freq: Every day | ORAL | 0 refills | Status: DC

## 2016-03-27 MED ORDER — ALPRAZOLAM 1 MG PO TABS: ORAL_TABLET | ORAL | 0 refills | Status: DC

## 2016-03-27 NOTE — Patient Instructions (Addendum)
It was great seeing you again.   Please work on diet and exercise.   I have sent in a new prescription for Lisinopril   Please follow up with me in 2 weeks to make sure this medication is working correctly. Call me if you have a dry cough as this medication can cause this  Follow up in one year for your next physical exam   I would also like for you to sign up for an annual wellness visit on a Friday with our nurse Manuela Schwartz. This is a free benefit under medicare that may help [place] find additional ways to help you.   Health Maintenance, Female Adopting a healthy lifestyle and getting preventive care can go a long way to promote health and wellness. Talk with your health care provider about what schedule of regular examinations is right for you. This is a good chance for you to check in with your provider about disease prevention and staying healthy. In between checkups, there are plenty of things you can do on your own. Experts have done a lot of research about which lifestyle changes and preventive measures are most likely to keep you healthy. Ask your health care provider for more information. WEIGHT AND DIET  Eat a healthy diet  Be sure to include plenty of vegetables, fruits, low-fat dairy products, and lean protein.  Do not eat a lot of foods high in solid fats, added sugars, or salt.  Get regular exercise. This is one of the most important things you can do for your health.  Most adults should exercise for at least 150 minutes each week. The exercise should increase your heart rate and make you sweat (moderate-intensity exercise).  Most adults should also do strengthening exercises at least twice a week. This is in addition to the moderate-intensity exercise.  Maintain a healthy weight  Body mass index (BMI) is a measurement that can be used to identify possible weight problems. It estimates body fat based on height and weight. Your health care provider can help determine your BMI and  help you achieve or maintain a healthy weight.  For females 84 years of age and older:   A BMI below 18.5 is considered underweight.  A BMI of 18.5 to 24.9 is normal.  A BMI of 25 to 29.9 is considered overweight.  A BMI of 30 and above is considered obese.  Watch levels of cholesterol and blood lipids  You should start having your blood tested for lipids and cholesterol at 70 years of age, then have this test every 5 years.  You may need to have your cholesterol levels checked more often if:  Your lipid or cholesterol levels are high.  You are older than 70 years of age.  You are at high risk for heart disease.  CANCER SCREENING   Lung Cancer  Lung cancer screening is recommended for adults 91-78 years old who are at high risk for lung cancer because of a history of smoking.  A yearly low-dose CT scan of the lungs is recommended for people who:  Currently smoke.  Have quit within the past 15 years.  Have at least a 30-pack-year history of smoking. A pack year is smoking an average of one pack of cigarettes a day for 1 year.  Yearly screening should continue until it has been 15 years since you quit.  Yearly screening should stop if you develop a health problem that would prevent you from having lung cancer treatment.  Breast Cancer  Practice breast self-awareness. This means understanding how your breasts normally appear and feel.  It also means doing regular breast self-exams. Let your health care provider know about any changes, no matter how small.  If you are in your 20s or 30s, you should have a clinical breast exam (CBE) by a health care provider every 1-3 years as part of a regular health exam.  If you are 44 or older, have a CBE every year. Also consider having a breast X-ray (mammogram) every year.  If you have a family history of breast cancer, talk to your health care provider about genetic screening.  If you are at high risk for breast cancer, talk  to your health care provider about having an MRI and a mammogram every year.  Breast cancer gene (BRCA) assessment is recommended for women who have family members with BRCA-related cancers. BRCA-related cancers include:  Breast.  Ovarian.  Tubal.  Peritoneal cancers.  Results of the assessment will determine the need for genetic counseling and BRCA1 and BRCA2 testing. Cervical Cancer Your health care provider may recommend that you be screened regularly for cancer of the pelvic organs (ovaries, uterus, and vagina). This screening involves a pelvic examination, including checking for microscopic changes to the surface of your cervix (Pap test). You may be encouraged to have this screening done every 3 years, beginning at age 69.  For women ages 37-65, health care providers may recommend pelvic exams and Pap testing every 3 years, or they may recommend the Pap and pelvic exam, combined with testing for human papilloma virus (HPV), every 5 years. Some types of HPV increase your risk of cervical cancer. Testing for HPV may also be done on women of any age with unclear Pap test results.  Other health care providers may not recommend any screening for nonpregnant women who are considered low risk for pelvic cancer and who do not have symptoms. Ask your health care provider if a screening pelvic exam is right for you.  If you have had past treatment for cervical cancer or a condition that could lead to cancer, you need Pap tests and screening for cancer for at least 20 years after your treatment. If Pap tests have been discontinued, your risk factors (such as having a new sexual partner) need to be reassessed to determine if screening should resume. Some women have medical problems that increase the chance of getting cervical cancer. In these cases, your health care provider may recommend more frequent screening and Pap tests. Colorectal Cancer  This type of cancer can be detected and often  prevented.  Routine colorectal cancer screening usually begins at 70 years of age and continues through 70 years of age.  Your health care provider may recommend screening at an earlier age if you have risk factors for colon cancer.  Your health care provider may also recommend using home test kits to check for hidden blood in the stool.  A small camera at the end of a tube can be used to examine your colon directly (sigmoidoscopy or colonoscopy). This is done to check for the earliest forms of colorectal cancer.  Routine screening usually begins at age 8.  Direct examination of the colon should be repeated every 5-10 years through 70 years of age. However, you may need to be screened more often if early forms of precancerous polyps or small growths are found. Skin Cancer  Check your skin from head to toe regularly.  Tell your health care provider about any  new moles or changes in moles, especially if there is a change in a mole's shape or color.  Also tell your health care provider if you have a mole that is larger than the size of a pencil eraser.  Always use sunscreen. Apply sunscreen liberally and repeatedly throughout the day.  Protect yourself by wearing long sleeves, pants, a wide-brimmed hat, and sunglasses whenever you are outside. HEART DISEASE, DIABETES, AND HIGH BLOOD PRESSURE   High blood pressure causes heart disease and increases the risk of stroke. High blood pressure is more likely to develop in:  People who have blood pressure in the high end of the normal range (130-139/85-89 mm Hg).  People who are overweight or obese.  People who are African American.  If you are 26-72 years of age, have your blood pressure checked every 3-5 years. If you are 50 years of age or older, have your blood pressure checked every year. You should have your blood pressure measured twice--once when you are at a hospital or clinic, and once when you are not at a hospital or clinic.  Record the average of the two measurements. To check your blood pressure when you are not at a hospital or clinic, you can use:  An automated blood pressure machine at a pharmacy.  A home blood pressure monitor.  If you are between 80 years and 69 years old, ask your health care provider if you should take aspirin to prevent strokes.  Have regular diabetes screenings. This involves taking a blood sample to check your fasting blood sugar level.  If you are at a normal weight and have a low risk for diabetes, have this test once every three years after 70 years of age.  If you are overweight and have a high risk for diabetes, consider being tested at a younger age or more often. PREVENTING INFECTION  Hepatitis B  If you have a higher risk for hepatitis B, you should be screened for this virus. You are considered at high risk for hepatitis B if:  You were born in a country where hepatitis B is common. Ask your health care provider which countries are considered high risk.  Your parents were born in a high-risk country, and you have not been immunized against hepatitis B (hepatitis B vaccine).  You have HIV or AIDS.  You use needles to inject street drugs.  You live with someone who has hepatitis B.  You have had sex with someone who has hepatitis B.  You get hemodialysis treatment.  You take certain medicines for conditions, including cancer, organ transplantation, and autoimmune conditions. Hepatitis C  Blood testing is recommended for:  Everyone born from [birth year] through 1965.  Anyone with known risk factors for hepatitis C. Sexually transmitted infections (STIs)  You should be screened for sexually transmitted infections (STIs) including gonorrhea and chlamydia if:  You are sexually active and are younger than 70 years of age.  You are older than 70 years of age and your health care provider tells you that you are at risk for this type of infection.  Your sexual activity  has changed since you were last screened and you are at an increased risk for chlamydia or gonorrhea. Ask your health care provider if you are at risk.  If you do not have HIV, but are at risk, it may be recommended that you take a prescription medicine daily to prevent HIV infection. This is called pre-exposure prophylaxis (PrEP). You are considered at risk  if:  You are sexually active and do not regularly use condoms or know the HIV status of your partner(s).  You take drugs by injection.  You are sexually active with a partner who has HIV. Talk with your health care provider about whether you are at high risk of being infected with HIV. If you choose to begin PrEP, you should first be tested for HIV. You should then be tested every 3 months for as long as you are taking PrEP.  PREGNANCY   If you are premenopausal and you may become pregnant, ask your health care provider about preconception counseling.  If you may become pregnant, take 400 to 800 micrograms (mcg) of folic acid every day.  If you want to prevent pregnancy, talk to your health care provider about birth control (contraception). OSTEOPOROSIS AND MENOPAUSE   Osteoporosis is a disease in which the bones lose minerals and strength with aging. This can result in serious bone fractures. Your risk for osteoporosis can be identified using a bone density scan.  If you are 70 years of age or older, or if you are at risk for osteoporosis and fractures, ask your health care provider if you should be screened.  Ask your health care provider whether you should take a calcium or vitamin D supplement to lower your risk for osteoporosis.  Menopause may have certain physical symptoms and risks.  Hormone replacement therapy may reduce some of these symptoms and risks. Talk to your health care provider about whether hormone replacement therapy is right for you.  HOME CARE INSTRUCTIONS   Schedule regular health, dental, and eye  exams.  Stay current with your immunizations.   Do not use any tobacco products including cigarettes, chewing tobacco, or electronic cigarettes.  If you are pregnant, do not drink alcohol.  If you are breastfeeding, limit how much and how often you drink alcohol.  Limit alcohol intake to no more than 1 drink per day for nonpregnant women. One drink equals 12 ounces of beer, 5 ounces of wine, or 1 ounces of hard liquor.  Do not use street drugs.  Do not share needles.  Ask your health care provider for help if you need support or information about quitting drugs.  Tell your health care provider if you often feel depressed.  Tell your health care provider if you have ever been abused or do not feel safe at home.   This information is not intended to replace advice given to you by your health care provider. Make sure you discuss any questions you have with your health care provider.   Document Released: 01/13/2011 Document Revised: 07/21/2014 Document Reviewed: 06/01/2013 Elsevier Interactive Patient Education [employer].

## 2016-03-27 NOTE — Progress Notes (Signed)
Subjective:    Patient ID: Julie [name], female    DOB: 1945-11-14, 70 y.o.   MRN: [id number]  HPI  Patient presents for yearly  medicine examination due to history of arthritis, depression, dyslipidemia, and GERD.   All immunizations and health maintenance protocols were reviewed with the patient and needed orders were placed.  Medication reconciliation,  past medical history, social history, problem list and allergies were reviewed in detail with the patient.   Goals were established with regard to weight loss, exercise, and  diet in compliance with medications. She does not exercises and eats a lot of prepackaged meals.   She is up to date on her mammogram, colonoscopy, vision and dental exams.   She has no acute complaints today    Review of Systems  Constitutional: Positive for fatigue (chronic ).  HENT: Negative.   Eyes: Negative.   Respiratory: Negative.   Cardiovascular: Negative.   Gastrointestinal: Negative.   Endocrine: Negative.   Genitourinary: Negative.   Musculoskeletal: Positive for arthralgias and back pain.  Skin: Negative.   Allergic/Immunologic: Negative.   Neurological: Negative.   Hematological: Negative.   Psychiatric/Behavioral: Positive for suicidal ideas. Negative for self-injury and sleep disturbance. The patient is not nervous/anxious.   All other systems reviewed and are negative.  Past Medical History:  Diagnosis Date  . ALLERGIC RHINITIS   . Anxiety    chronic BZs  . Arthritis   . ARTHRITIS, TRAUMATIC, ANKLE   . Cataract   . Depression   . Dyslipidemia   . EXOGENOUS OBESITY   . GERD (gastroesophageal reflux disease)   . Hemorrhoids   . Hyperglycemia   . Melanoma in situ of left lower leg [facility])     Social History   Social History  . Marital status: Married    Spouse name: N/A  . Number of children: N/A  . Years of education: N/A   Occupational History  . Not on file.   Social History Main Topics  . Smoking status:  Never Smoker  . Smokeless tobacco: Never Used  . Alcohol use No  . Drug use: No  . Sexual activity: Not on file   Other Topics Concern  . Not on file   Social History Narrative   Widowed (spouse - Ron of >44years prior to death)   widowed 03/31/2011 - 2 grown supportive dtrs, 5 g-kis (3 are triplets)    Past Surgical History:  Procedure Laterality Date  . ABDOMINAL HYSTERECTOMY  10/1978  . ANKLE ARTHROSCOPY Right 10/19/12   deorio  . arthroscopy left knee  05/2002, 04/2010  . BREAST SURGERY  11/1988   Benign mass (R) breast  . C. surgery history and scan sheet    . Cataract surgery     (L) eye 09/06/12 & (R) eye 10/11/12  . CHOLECYSTECTOMY  09/2004  . cysto sling  10/2006   3 times  . FRACTURE SURGERY     right ankle  . JOINT REPLACEMENT  06/2010   R ankle  . MASS EXCISION     under right arm  . removal multiple cyst breast left and right    . sling anterior repair  12/1999  . SPINE SURGERY     laminotomy with decompression  . Toe Exostosis  10/08   Bil    Family History  Problem Relation Age of Onset  . Hyperlipidemia Father   . Coronary artery disease Father     cabg 70yo  . Leukemia Father   .  Hypertension Father   . Heart attack Brother 58    stents x 2 (76 & 41 yo)  . Diabetes Brother   . Diabetes Mother   . Hyperlipidemia Mother   . Hypertension Mother   . Colon cancer Neg Hx     Allergies  Allergen Reactions  . Hydrocodone-Acetaminophen     REACTION: hives, itiching  . Latex     Causes redness and itching  . Oxycodone-Acetaminophen Itching    REACTION: itching    Current Outpatient Prescriptions on File Prior to Visit  Medication Sig Dispense Refill  . aspirin 81 MG tablet Take 81 mg by mouth daily.      Marland Kitchen atorvastatin (LIPITOR) 20 MG tablet take 1 tablet by mouth once daily 30 tablet 11  . Biotin 2500 MCG CAPS Take by mouth daily.    . Calcium Carbonate-Vitamin D (CALTRATE 600+D) 600-400 MG-UNIT per tablet Take 1 tablet by mouth daily.      .  Multiple Vitamin (MULTIVITAMIN) tablet Take 1 tablet by mouth daily.      . Omega-3 Fatty Acids (FISH OIL) 1000 MG CAPS Take by mouth 2 (two) times daily.    . predniSONE (DELTASONE) 20 MG tablet Take 3 po qd for 2 days then 2 po qd for 3 days,or as directed 12 tablet 0  . RA ALLERGY RELIEF 180 MG tablet take 1 tablet by mouth once daily 30 tablet 5   No current facility-administered medications on file prior to visit.     BP (!) 142/88   Temp 98.6 F (37 C) (Oral)   Ht 5' 7.75" (1.721 m)   Wt 194 lb 9.6 oz (88.3 kg)   BMI 29.81 kg/m       Objective:   Physical Exam  Constitutional: She is oriented to person, place, and time. She appears well-developed and well-nourished. No distress.  obese  HENT:  Head: Normocephalic and atraumatic.  Right Ear: External ear normal.  Left Ear: External ear normal.  Nose: Nose normal.  Mouth/Throat: Oropharynx is clear and moist. No oropharyngeal exudate.  Eyes: Conjunctivae and EOM are normal. Pupils are equal, round, and reactive to light. Right eye exhibits no discharge. Left eye exhibits no discharge. No scleral icterus.  Neck: Normal range of motion. Neck supple. No JVD present. No tracheal deviation present. No thyromegaly present.  Cardiovascular: Normal rate, regular rhythm, normal heart sounds and intact distal pulses.  Exam reveals no gallop and no friction rub.   No murmur heard. Pulmonary/Chest: Effort normal and breath sounds normal. No stridor. No respiratory distress. She has no wheezes. She has no rales. She exhibits no tenderness.  Abdominal: Soft. Bowel sounds are normal. She exhibits no distension and no mass. There is no tenderness. There is no guarding.  Genitourinary:  Genitourinary Comments: Refused breast exam   Musculoskeletal: Normal range of motion. She exhibits no edema, tenderness or deformity.  Lymphadenopathy:    She has no cervical adenopathy.  Neurological: She is alert and oriented to person, place, and time.  She has normal reflexes. She displays normal reflexes. No cranial nerve deficit. She exhibits normal muscle tone. Coordination normal.  Skin: Skin is warm and dry. No rash noted. She is not diaphoretic. No erythema. No pallor.  Surgical scar on right foot.   Psychiatric: She has a normal mood and affect. Her behavior is normal. Judgment and thought content normal.  Nursing note and vitals reviewed.     Assessment & Plan:  1. Essential hypertension - lisinopril (  PRINIVIL,ZESTRIL) 5 MG tablet; Take 1 tablet (5 mg total) by mouth daily.  Dispense: 90 tablet; Refill: 0 - EKG 12-Lead- Sinus  Rhythm  -Anterior infarct -age undetermined., Rate 68  - Follow up with in 2 weeks - Monitor BP at home.  2. Hyperglycemia - EKG 12-Lead - POC HgB A1c - 5.6  3. Dyslipidemia - Controlled with statin  - EKG 12-Lead  4. Need for prophylactic vaccination and inoculation against influenza - Flu vaccine HIGH DOSE PF (Fluzone High dose)  5. Anxiety and depression - Well controlled.  - She is tapering down on xanax. Will cut her down to 60 taps a month from 120.  - buPROPion (WELLBUTRIN XL) 300 MG 24 hr tablet; Take 1 tablet (300 mg total) by mouth daily.  Dispense: 90 tablet; Refill: 1 - ALPRAZolam (XANAX) 1 MG tablet; take 1 tablet by mouth two times a day if needed for anxiety  Dispense: 60 tablet; Refill: 0  Dorothyann Peng, NP
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
## 2016-04-10 ENCOUNTER — Ambulatory Visit: Payer: Medicare Other

## 2016-04-10 ENCOUNTER — Ambulatory Visit (INDEPENDENT_AMBULATORY_CARE_PROVIDER_SITE_OTHER): Payer: Medicare Other | Admitting: Adult Health

## 2016-04-10 VITALS — BP 120/80 | HR 72 | Ht 67.3 in | Wt 194.0 lb

## 2016-04-10 DIAGNOSIS — I10 Essential (primary) hypertension: Secondary | ICD-10-CM

## 2016-04-10 DIAGNOSIS — Z0000 Encounter for general adult medical examination without abnormal findings: Secondary | ICD-10-CM

## 2016-04-10 NOTE — Patient Instructions (Addendum)
Julie [name] , Thank you for taking time to come for your Medicare Wellness Visit. I appreciate your ongoing commitment to your health goals. Please review the following plan we discussed and let me know if I can assist you in the future.   Try to find a food option that works for you.   Check to see if Health care power of attorney is in will; or if a living will;  Will try to complete AD; Given copy  Referred to [facility] for questions [place] offers free advance directive forms, as well as assistance in completing the forms themselves. For assistance, contact the Spiritual Care Department at [phone], or the Clinical Social Work Department at [phone].  Personal safety issues reviewed:  1. Consider starting a community watch program per [facility] 2.  Changes batteries is smoke detector and/or carbon monoxide detector  3.  If you have firearms; keep them in a safe place 4.  Wear protection when in the sun; Always wear sunscreen or a hat; It is good to have your doctor check your skin annually or review any new areas of concern 5. Driving safety; Keep in the right lane; stay 3 car lengths behind the car in front of you on the highway; look 3 times prior to pulling out; carry your cell phone everywhere you go!   Learn about the Yellow Dot program:  The program allows first responders at your emergency to have access to who your physician is, as well as your medications and medical conditions.  Citizens requesting the Yellow Dot Packages should contact Master Corporal Nunzio Cobbs at the [facility] [phone] for the first week of the program and beginning the week after Easter citizens should contact their Scientist, physiological.        These are the goals we discussed: Goals    . 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 Mammogram  02/24/2016  . Tetanus Vaccine  04/13/2019  . Colon Cancer Screening  06/24/2025  . Flu Shot  Completed  . DEXA scan (bone density measurement)  Completed  . Shingles Vaccine  Completed  .  Hepatitis C: One time screening is recommended by Center for Disease Control  (CDC) for  adults born from [birth year] through 1965.   Completed  . Pneumonia vaccines  Completed       Fall Prevention in the Home  Falls can cause injuries. They can happen to people of all ages. There are many things you can do to make your home safe and to help prevent falls.  WHAT CAN I DO ON THE OUTSIDE OF MY HOME?  Regularly fix the edges of walkways and driveways and fix any cracks.  Remove anything that might make you trip as you walk through a door, such as a raised step or threshold.  Trim any bushes or trees on the path to your home.  Use bright outdoor lighting.  Clear any walking paths of anything that might make someone trip, such as rocks or tools.  Regularly check to see if handrails are loose or broken. Make sure that both sides of any steps have handrails.  Any raised decks and porches should have guardrails on the edges.  Have any leaves, snow,  or ice cleared regularly.  Use sand or salt on walking paths during winter.  Clean up any spills in your garage right away. This includes oil or grease spills. WHAT CAN I DO IN THE BATHROOM?   Use night lights.  Install grab bars by the toilet and in the tub and shower. Do not use towel bars as grab bars.  Use non-skid mats or decals in the tub or shower.  If you need to sit down in the shower, use a plastic, non-slip stool.  Keep the floor dry. Clean up any water that spills on the floor as soon as it happens.  Remove soap buildup in the tub or shower regularly.  Attach bath mats securely with double-sided non-slip rug tape.  Do not have throw rugs and other things on the floor that can make you  trip. WHAT CAN I DO IN THE BEDROOM?  Use night lights.  Make sure that you have a light by your bed that is easy to reach.  Do not use any sheets or blankets that are too big for your bed. They should not hang down onto the floor.  Have a firm chair that has side arms. You can use this for support while you get dressed.  Do not have throw rugs and other things on the floor that can make you trip. WHAT CAN I DO IN THE KITCHEN?  Clean up any spills right away.  Avoid walking on wet floors.  Keep items that you use a lot in easy-to-reach places.  If you need to reach something above you, use a strong step stool that has a grab bar.  Keep electrical cords out of the way.  Do not use floor polish or wax that makes floors slippery. If you must use wax, use non-skid floor wax.  Do not have throw rugs and other things on the floor that can make you trip. WHAT CAN I DO WITH MY STAIRS?  Do not leave any items on the stairs.  Make sure that there are handrails on both sides of the stairs and use them. Fix handrails that are broken or loose. Make sure that handrails are as long as the stairways.  Check any carpeting to make sure that it is firmly attached to the stairs. Fix any carpet that is loose or worn.  Avoid having throw rugs at the top or bottom of the stairs. If you do have throw rugs, attach them to the floor with carpet tape.  Make sure that you have a light switch at the top of the stairs and the bottom of the stairs. If you do not have them, ask someone to add them for you. WHAT ELSE CAN I DO TO HELP PREVENT FALLS?  Wear shoes that:  Do not have high heels.  Have rubber bottoms.  Are comfortable and fit you well.  Are closed at the toe. Do not wear sandals.  If you use a stepladder:  Make sure that it is fully opened. Do not climb a closed stepladder.  Make sure that both sides of the stepladder are locked into place.  Ask someone to hold it for you, if  possible.  Clearly mark and make sure that you can see:  Any grab bars or handrails.  First and last steps.  Where the edge of each step is.  Use tools that help you move around (mobility aids) if they are needed. These include:  Canes.  Walkers.  Scooters.  Crutches.  Turn  on the lights when you go into a dark area. Replace any light bulbs as soon as they burn out.  Set up your furniture so you have a clear path. Avoid moving your furniture around.  If any of your floors are uneven, fix them.  If there are any pets around you, be aware of where they are.  Review your medicines with your doctor. Some medicines can make you feel dizzy. This can increase your chance of falling. Ask your doctor what other things that you can do to help prevent falls.   This information is not intended to replace advice given to you by your health care provider. Make sure you discuss any questions you have with your health care provider.   Document Released: 04/26/2009 Document Revised: 11/14/2014 Document Reviewed: 08/04/2014 Elsevier Interactive Patient Education 2016 [place] Maintenance, Female Adopting a healthy lifestyle and getting preventive care can go a long way to promote health and wellness. Talk with your health care provider about what schedule of regular examinations is right for you. This is a good chance for you to check in with your provider about disease prevention and staying healthy. In between checkups, there are plenty of things you can do on your own. Experts have done a lot of research about which lifestyle changes and preventive measures are most likely to keep you healthy. Ask your health care provider for more information. WEIGHT AND DIET  Eat a healthy diet  Be sure to include plenty of vegetables, fruits, low-fat dairy products, and lean protein.  Do not eat a lot of foods high in solid fats, added sugars, or salt.  Get regular exercise. This is  one of the most important things you can do for your health.  Most adults should exercise for at least 150 minutes each week. The exercise should increase your heart rate and make you sweat (moderate-intensity exercise).  Most adults should also do strengthening exercises at least twice a week. This is in addition to the moderate-intensity exercise.  Maintain a healthy weight  Body mass index (BMI) is a measurement that can be used to identify possible weight problems. It estimates body fat based on height and weight. Your health care provider can help determine your BMI and help you achieve or maintain a healthy weight.  For females 70 years of age and older:   A BMI below 18.5 is considered underweight.  A BMI of 18.5 to 24.9 is normal.  A BMI of 25 to 29.9 is considered overweight.  A BMI of 30 and above is considered obese.  Watch levels of cholesterol and blood lipids  You should start having your blood tested for lipids and cholesterol at 70 years of age, then have this test every 5 years.  You may need to have your cholesterol levels checked more often if:  Your lipid or cholesterol levels are high.  You are older than 70 years of age.  You are at high risk for heart disease.  CANCER SCREENING   Lung Cancer  Lung cancer screening is recommended for adults 33-18 years old who are at high risk for lung cancer because of a history of smoking.  A yearly low-dose CT scan of the lungs is recommended for people who:  Currently smoke.  Have quit within the past 15 years.  Have at least a 30-pack-year history of smoking. A pack year is smoking an average of one pack of cigarettes a day for 1 year.  Yearly  screening should continue until it has been 15 years since you quit.  Yearly screening should stop if you develop a health problem that would prevent you from having lung cancer treatment.  Breast Cancer  Practice breast self-awareness. This means understanding  how your breasts normally appear and feel.  It also means doing regular breast self-exams. Let your health care provider know about any changes, no matter how small.  If you are in your 20s or 30s, you should have a clinical breast exam (CBE) by a health care provider every 1-3 years as part of a regular health exam.  If you are 40 or older, have a CBE every year. Also consider having a breast X-ray (mammogram) every year.  If you have a family history of breast cancer, talk to your health care provider about genetic screening.  If you are at high risk for breast cancer, talk to your health care provider about having an MRI and a mammogram every year.  Breast cancer gene (BRCA) assessment is recommended for women who have family members with BRCA-related cancers. BRCA-related cancers include:  Breast.  Ovarian.  Tubal.  Peritoneal cancers.  Results of the assessment will determine the need for genetic counseling and BRCA1 and BRCA2 testing. Cervical Cancer Your health care provider may recommend that you be screened regularly for cancer of the pelvic organs (ovaries, uterus, and vagina). This screening involves a pelvic examination, including checking for microscopic changes to the surface of your cervix (Pap test). You may be encouraged to have this screening done every 3 years, beginning at age 8.  For women ages 68-65, health care providers may recommend pelvic exams and Pap testing every 3 years, or they may recommend the Pap and pelvic exam, combined with testing for human papilloma virus (HPV), every 5 years. Some types of HPV increase your risk of cervical cancer. Testing for HPV may also be done on women of any age with unclear Pap test results.  Other health care providers may not recommend any screening for nonpregnant women who are considered low risk for pelvic cancer and who do not have symptoms. Ask your health care provider if a screening pelvic exam is right for  you.  If you have had past treatment for cervical cancer or a condition that could lead to cancer, you need Pap tests and screening for cancer for at least 20 years after your treatment. If Pap tests have been discontinued, your risk factors (such as having a new sexual partner) need to be reassessed to determine if screening should resume. Some women have medical problems that increase the chance of getting cervical cancer. In these cases, your health care provider may recommend more frequent screening and Pap tests. Colorectal Cancer  This type of cancer can be detected and often prevented.  Routine colorectal cancer screening usually begins at 70 years of age and continues through 70 years of age.  Your health care provider may recommend screening at an earlier age if you have risk factors for colon cancer.  Your health care provider may also recommend using home test kits to check for hidden blood in the stool.  A small camera at the end of a tube can be used to examine your colon directly (sigmoidoscopy or colonoscopy). This is done to check for the earliest forms of colorectal cancer.  Routine screening usually begins at age 72.  Direct examination of the colon should be repeated every 5-10 years through 70 years of age. However, you may  need to be screened more often if early forms of precancerous polyps or small growths are found. Skin Cancer  Check your skin from head to toe regularly.  Tell your health care provider about any new moles or changes in moles, especially if there is a change in a mole's shape or color.  Also tell your health care provider if you have a mole that is larger than the size of a pencil eraser.  Always use sunscreen. Apply sunscreen liberally and repeatedly throughout the day.  Protect yourself by wearing long sleeves, pants, a wide-brimmed hat, and sunglasses whenever you are outside. HEART DISEASE, DIABETES, AND HIGH BLOOD PRESSURE   High blood  pressure causes heart disease and increases the risk of stroke. High blood pressure is more likely to develop in:  People who have blood pressure in the high end of the normal range (130-139/85-89 mm Hg).  People who are overweight or obese.  People who are African American.  If you are 68-53 years of age, have your blood pressure checked every 3-5 years. If you are 50 years of age or older, have your blood pressure checked every year. You should have your blood pressure measured twice--once when you are at a hospital or clinic, and once when you are not at a hospital or clinic. Record the average of the two measurements. To check your blood pressure when you are not at a hospital or clinic, you can use:  An automated blood pressure machine at a pharmacy.  A home blood pressure monitor.  If you are between 27 years and 30 years old, ask your health care provider if you should take aspirin to prevent strokes.  Have regular diabetes screenings. This involves taking a blood sample to check your fasting blood sugar level.  If you are at a normal weight and have a low risk for diabetes, have this test once every three years after 70 years of age.  If you are overweight and have a high risk for diabetes, consider being tested at a younger age or more often. PREVENTING INFECTION  Hepatitis B  If you have a higher risk for hepatitis B, you should be screened for this virus. You are considered at high risk for hepatitis B if:  You were born in a country where hepatitis B is common. Ask your health care provider which countries are considered high risk.  Your parents were born in a high-risk country, and you have not been immunized against hepatitis B (hepatitis B vaccine).  You have HIV or AIDS.  You use needles to inject street drugs.  You live with someone who has hepatitis B.  You have had sex with someone who has hepatitis B.  You get hemodialysis treatment.  You take certain  medicines for conditions, including cancer, organ transplantation, and autoimmune conditions. Hepatitis C  Blood testing is recommended for:  Everyone born from [birth year] through 1965.  Anyone with known risk factors for hepatitis C. Sexually transmitted infections (STIs)  You should be screened for sexually transmitted infections (STIs) including gonorrhea and chlamydia if:  You are sexually active and are younger than 70 years of age.  You are older than 70 years of age and your health care provider tells you that you are at risk for this type of infection.  Your sexual activity has changed since you were last screened and you are at an increased risk for chlamydia or gonorrhea. Ask your health care provider if you are at risk.  If you do not have HIV, but are at risk, it may be recommended that you take a prescription medicine daily to prevent HIV infection. This is called pre-exposure prophylaxis (PrEP). You are considered at risk if:  You are sexually active and do not regularly use condoms or know the HIV status of your partner(s).  You take drugs by injection.  You are sexually active with a partner who has HIV. Talk with your health care provider about whether you are at high risk of being infected with HIV. If you choose to begin PrEP, you should first be tested for HIV. You should then be tested every 3 months for as long as you are taking PrEP.  PREGNANCY   If you are premenopausal and you may become pregnant, ask your health care provider about preconception counseling.  If you may become pregnant, take 400 to 800 micrograms (mcg) of folic acid every day.  If you want to prevent pregnancy, talk to your health care provider about birth control (contraception). OSTEOPOROSIS AND MENOPAUSE   Osteoporosis is a disease in which the bones lose minerals and strength with aging. This can result in serious bone fractures. Your risk for osteoporosis can be identified using a bone  density scan.  If you are 66 years of age or older, or if you are at risk for osteoporosis and fractures, ask your health care provider if you should be screened.  Ask your health care provider whether you should take a calcium or vitamin D supplement to lower your risk for osteoporosis.  Menopause may have certain physical symptoms and risks.  Hormone replacement therapy may reduce some of these symptoms and risks. Talk to your health care provider about whether hormone replacement therapy is right for you.  HOME CARE INSTRUCTIONS   Schedule regular health, dental, and eye exams.  Stay current with your immunizations.   Do not use any tobacco products including cigarettes, chewing tobacco, or electronic cigarettes.  If you are pregnant, do not drink alcohol.  If you are breastfeeding, limit how much and how often you drink alcohol.  Limit alcohol intake to no more than 1 drink per day for nonpregnant women. One drink equals 12 ounces of beer, 5 ounces of wine, or 1 ounces of hard liquor.  Do not use street drugs.  Do not share needles.  Ask your health care provider for help if you need support or information about quitting drugs.  Tell your health care provider if you often feel depressed.  Tell your health care provider if you have ever been abused or do not feel safe at home.   This information is not intended to replace advice given to you by your health care provider. Make sure you discuss any questions you have with your health care provider.   Document Released: 01/13/2011 Document Revised: 07/21/2014 Document Reviewed: 06/01/2013 Elsevier Interactive Patient Education [employer].

## 2016-04-10 NOTE — Progress Notes (Signed)
Subjective:    Patient ID: Julie [name], female    DOB: [birth year], 70 y.o.   MRN: [id number]  HPI  70 year old female who presents to the office today for two week follow up regarding HTN. She was started on lisinopril 5 mg during the last visit. She has not have any trouble with the lisinopril.   She is not monitoring her blood pressures at home.   Denies any dizziness or lightheadedness  BP Readings from Last 3 Encounters:  04/10/16 120/80  03/27/16 (!) 142/88  08/29/15 (!) 160/86     Review of Systems  Constitutional: Negative.   Respiratory: Negative.   Cardiovascular: Negative.   Neurological: Negative.   All other systems reviewed and are negative.  Past Medical History:  Diagnosis Date  . ALLERGIC RHINITIS   . Anxiety    chronic BZs  . Arthritis   . ARTHRITIS, TRAUMATIC, ANKLE   . Cataract   . Depression   . Dyslipidemia   . EXOGENOUS OBESITY   . GERD (gastroesophageal reflux disease)   . Hemorrhoids   . Hyperglycemia   . Melanoma in situ of left lower leg [facility])     Social History   Social History  . Marital status: Married    Spouse name: N/A  . Number of children: N/A  . Years of education: N/A   Occupational History  . Not on file.   Social History Main Topics  . Smoking status: Never Smoker  . Smokeless tobacco: Never Used  . Alcohol use No  . Drug use: No  . Sexual activity: Not on file   Other Topics Concern  . Not on file   Social History Narrative   Widowed (spouse - Ron of >44years prior to death)   widowed 04/08/2011 - 2 grown supportive dtrs, 5 g-kis (3 are triplets)    Past Surgical History:  Procedure Laterality Date  . ABDOMINAL HYSTERECTOMY  10/1978  . ANKLE ARTHROSCOPY Right 10/19/12   deorio  . arthroscopy left knee  05/2002, 04/2010  . BREAST SURGERY  11/1988   Benign mass (R) breast  . C. surgery history and scan sheet    . Cataract surgery     (L) eye 09/06/12 & (R) eye 10/11/12  . CHOLECYSTECTOMY  09/2004  .  cysto sling  10/2006   3 times  . FRACTURE SURGERY     right ankle  . JOINT REPLACEMENT  06/2010   R ankle  . MASS EXCISION     under right arm  . removal multiple cyst breast left and right    . sling anterior repair  12/1999  . SPINE SURGERY     laminotomy with decompression  . Toe Exostosis  10/08   Bil    Family History  Problem Relation Age of Onset  . Hyperlipidemia Father   . Coronary artery disease Father     cabg 70yo  . Leukemia Father   . Hypertension Father   . Heart attack Brother 56    stents x 2 (37 & 43 yo)  . Diabetes Brother   . Diabetes Mother   . Hyperlipidemia Mother   . Hypertension Mother   . Colon cancer Neg Hx     Allergies  Allergen Reactions  . Hydrocodone-Acetaminophen     REACTION: hives, itiching  . Latex     Causes redness and itching  . Oxycodone-Acetaminophen Itching    REACTION: itching    Current Outpatient Prescriptions on File Prior  to Visit  Medication Sig Dispense Refill  . ALPRAZolam (XANAX) 1 MG tablet take 1 tablet by mouth two times a day if needed for anxiety 60 tablet 0  . aspirin 81 MG tablet Take 81 mg by mouth daily.      Marland Kitchen atorvastatin (LIPITOR) 20 MG tablet take 1 tablet by mouth once daily 30 tablet 11  . Biotin 2500 MCG CAPS Take by mouth daily.    Marland Kitchen buPROPion (WELLBUTRIN XL) 300 MG 24 hr tablet Take 1 tablet (300 mg total) by mouth daily. 90 tablet 1  . Calcium Carbonate-Vitamin D (CALTRATE 600+D) 600-400 MG-UNIT per tablet Take 1 tablet by mouth daily.      Marland Kitchen lisinopril (PRINIVIL,ZESTRIL) 5 MG tablet Take 1 tablet (5 mg total) by mouth daily. 90 tablet 0  . Multiple Vitamin (MULTIVITAMIN) tablet Take 1 tablet by mouth daily.      . Omega-3 Fatty Acids (FISH OIL) 1000 MG CAPS Take by mouth 2 (two) times daily.    Marland Kitchen RA ALLERGY RELIEF 180 MG tablet take 1 tablet by mouth once daily 30 tablet 5   No current facility-administered medications on file prior to visit.     BP 120/80   Pulse 72   Ht 5' 7.3" (1.709  m)   Wt 194 lb (88 kg)   SpO2 98%   BMI 30.11 kg/m       Objective:   Physical Exam  Constitutional: She is oriented to person, place, and time. She appears well-developed and well-nourished. No distress.  HENT:  Head: Normocephalic and atraumatic.  Right Ear: External ear normal.  Left Ear: External ear normal.  Nose: Nose normal.  Mouth/Throat: Oropharynx is clear and moist. No oropharyngeal exudate.  Cardiovascular: Normal rate, regular rhythm, normal heart sounds and intact distal pulses.  Exam reveals no gallop and no friction rub.   No murmur heard. Pulmonary/Chest: Effort normal and breath sounds normal. No respiratory distress. She has no wheezes. She has no rales. She exhibits no tenderness.  Musculoskeletal: Normal range of motion. She exhibits no edema, tenderness or deformity.  Neurological: She is alert and oriented to person, place, and time.  Skin: Skin is warm and dry. She is not diaphoretic.  Psychiatric: She has a normal mood and affect. Her behavior is normal. Judgment and thought content normal.  Nursing note and vitals reviewed.     Assessment & Plan:  1. Essential hypertension - At goal currently - I would be hopeful that with 20 pounds of weight loss she could come off lisinopril - Work on diet and exercise - Follow up as needed - Monitor at home  Dorothyann Peng, NP

## 2016-04-24 DIAGNOSIS — Z85820 Personal history of malignant melanoma of skin: Secondary | ICD-10-CM | POA: Diagnosis not present

## 2016-04-24 DIAGNOSIS — D225 Melanocytic nevi of trunk: Secondary | ICD-10-CM | POA: Diagnosis not present

## 2016-04-24 DIAGNOSIS — D2272 Melanocytic nevi of left lower limb, including hip: Secondary | ICD-10-CM | POA: Diagnosis not present

## 2016-04-24 DIAGNOSIS — L821 Other seborrheic keratosis: Secondary | ICD-10-CM | POA: Diagnosis not present

## 2016-04-24 DIAGNOSIS — D2261 Melanocytic nevi of right upper limb, including shoulder: Secondary | ICD-10-CM | POA: Diagnosis not present

## 2016-04-24 DIAGNOSIS — D2262 Melanocytic nevi of left upper limb, including shoulder: Secondary | ICD-10-CM | POA: Diagnosis not present

## 2016-04-24 DIAGNOSIS — L738 Other specified follicular disorders: Secondary | ICD-10-CM | POA: Diagnosis not present

## 2016-04-24 DIAGNOSIS — L820 Inflamed seborrheic keratosis: Secondary | ICD-10-CM | POA: Diagnosis not present

## 2016-06-23 DIAGNOSIS — M25571 Pain in right ankle and joints of right foot: Secondary | ICD-10-CM | POA: Diagnosis not present

## 2016-06-23 DIAGNOSIS — Z471 Aftercare following joint replacement surgery: Secondary | ICD-10-CM | POA: Diagnosis not present

## 2016-06-23 DIAGNOSIS — Z96661 Presence of right artificial ankle joint: Secondary | ICD-10-CM | POA: Diagnosis not present

## 2016-06-25 DIAGNOSIS — Z01419 Encounter for gynecological examination (general) (routine) without abnormal findings: Secondary | ICD-10-CM | POA: Diagnosis not present

## 2016-06-25 DIAGNOSIS — N760 Acute vaginitis: Secondary | ICD-10-CM | POA: Diagnosis not present

## 2016-06-25 DIAGNOSIS — Z6830 Body mass index (BMI) 30.0-30.9, adult: Secondary | ICD-10-CM | POA: Diagnosis not present

## 2016-06-30 ENCOUNTER — Other Ambulatory Visit: Payer: Self-pay | Admitting: Adult Health

## 2016-06-30 DIAGNOSIS — I10 Essential (primary) hypertension: Secondary | ICD-10-CM

## 2016-07-29 ENCOUNTER — Other Ambulatory Visit: Payer: Self-pay | Admitting: Adult Health

## 2016-07-29 ENCOUNTER — Ambulatory Visit (INDEPENDENT_AMBULATORY_CARE_PROVIDER_SITE_OTHER)
Admission: RE | Admit: 2016-07-29 | Discharge: 2016-07-29 | Disposition: A | Discharge status: Home / Self Care | Payer: Medicare Other | Source: Ambulatory Visit | Attending: Adult Health | Admitting: Adult Health

## 2016-07-29 ENCOUNTER — Ambulatory Visit (INDEPENDENT_AMBULATORY_CARE_PROVIDER_SITE_OTHER): Payer: Medicare Other | Admitting: Adult Health

## 2016-07-29 ENCOUNTER — Encounter: Payer: Self-pay | Admitting: Adult Health

## 2016-07-29 VITALS — BP 138/84 | Temp 98.1°F

## 2016-07-29 DIAGNOSIS — N3001 Acute cystitis with hematuria: Secondary | ICD-10-CM | POA: Diagnosis not present

## 2016-07-29 DIAGNOSIS — R05 Cough: Secondary | ICD-10-CM

## 2016-07-29 DIAGNOSIS — R059 Cough, unspecified: Secondary | ICD-10-CM

## 2016-07-29 DIAGNOSIS — R0602 Shortness of breath: Secondary | ICD-10-CM | POA: Diagnosis not present

## 2016-07-29 LAB — POC URINALSYSI DIPSTICK (AUTOMATED)
Bilirubin, UA: NEGATIVE
GLUCOSE UA: NEGATIVE
Ketones, UA: NEGATIVE
SPEC GRAV UA: 1.025
Urobilinogen, UA: NEGATIVE
pH, UA: 6

## 2016-07-29 MED ORDER — CIPROFLOXACIN HCL 500 MG PO TABS: 500.0000 mg | ORAL_TABLET | Freq: Two times a day (BID) | ORAL | 0 refills | Status: DC

## 2016-07-29 MED ORDER — PREDNISONE 10 MG PO TABS: ORAL_TABLET | ORAL | 0 refills | Status: DC

## 2016-07-29 NOTE — Progress Notes (Signed)
Subjective:    Patient ID: VAYAH BLOT, female    DOB: [birth year], 71 y.o.   MRN: [id number]  HPI  71 year old female who presents to the office today for two acute issues.   1. Cough for about 1 month. She reports having a semi productive cough that started on Christmas morning. She reports shortness of breath with exertion but does not feel congested. Denies fevers or feeling ill. Does not feel congested. She has been using OTC cough medication which has helped minimally.   2. UTI - has had UTI symptoms for about 2 weeks. Her symptoms include low back pain, dysuria, hematuria, frequency and urgency. Reports " this feels like my typical UTI."   Review of Systems  Constitutional: Negative.   HENT: Negative.   Respiratory: Positive for cough and shortness of breath. Negative for wheezing.   Genitourinary: Positive for dysuria, flank pain, frequency, hematuria and urgency.  Musculoskeletal: Positive for back pain.  All other systems reviewed and are negative.  Past Medical History:  Diagnosis Date  . ALLERGIC RHINITIS   . Anxiety    chronic BZs  . Arthritis   . ARTHRITIS, TRAUMATIC, ANKLE   . Cataract   . Depression   . Dyslipidemia   . EXOGENOUS OBESITY   . GERD (gastroesophageal reflux disease)   . Hemorrhoids   . Hyperglycemia   . Melanoma in situ of left lower leg [facility])     Social History   Social History  . Marital status: Married    Spouse name: N/A  . Number of children: N/A  . Years of education: N/A   Occupational History  . Not on file.   Social History Main Topics  . Smoking status: Never Smoker  . Smokeless tobacco: Never Used  . Alcohol use No  . Drug use: No  . Sexual activity: Not on file   Other Topics Concern  . Not on file   Social History Narrative   Widowed (spouse - Ron of >44years prior to death)   widowed April 14, 2011 - 2 grown supportive dtrs, 5 g-kis (3 are triplets)    Past Surgical History:  Procedure Laterality Date  .  ABDOMINAL HYSTERECTOMY  10/1978  . ANKLE ARTHROSCOPY Right 10/19/12   deorio  . arthroscopy left knee  05/2002, 04/2010  . BREAST SURGERY  11/1988   Benign mass (R) breast  . C. surgery history and scan sheet    . Cataract surgery     (L) eye 09/06/12 & (R) eye 10/11/12  . CHOLECYSTECTOMY  09/2004  . cysto sling  10/2006   3 times  . FRACTURE SURGERY     right ankle  . JOINT REPLACEMENT  06/2010   R ankle  . MASS EXCISION     under right arm  . removal multiple cyst breast left and right    . sling anterior repair  12/1999  . SPINE SURGERY     laminotomy with decompression  . Toe Exostosis  10/08   Bil    Family History  Problem Relation Age of Onset  . Hyperlipidemia Father   . Coronary artery disease Father     cabg 70yo  . Leukemia Father   . Hypertension Father   . Heart attack Brother 75    stents x 2 (80 & 39 yo)  . Diabetes Brother   . Diabetes Mother   . Hyperlipidemia Mother   . Hypertension Mother   . Colon cancer Neg Hx  Allergies  Allergen Reactions  . Hydrocodone-Acetaminophen     REACTION: hives, itiching  . Latex     Causes redness and itching  . Oxycodone-Acetaminophen Itching    REACTION: itching    Current Outpatient Prescriptions on File Prior to Visit  Medication Sig Dispense Refill  . ALPRAZolam (XANAX) 1 MG tablet take 1 tablet by mouth two times a day if needed for anxiety 60 tablet 0  . aspirin 81 MG tablet Take 81 mg by mouth daily.      Marland Kitchen atorvastatin (LIPITOR) 20 MG tablet take 1 tablet by mouth once daily 30 tablet 11  . Biotin 2500 MCG CAPS Take by mouth daily.    Marland Kitchen buPROPion (WELLBUTRIN XL) 300 MG 24 hr tablet Take 1 tablet (300 mg total) by mouth daily. 90 tablet 1  . Calcium Carbonate-Vitamin D (CALTRATE 600+D) 600-400 MG-UNIT per tablet Take 1 tablet by mouth daily.      Marland Kitchen lisinopril (PRINIVIL,ZESTRIL) 5 MG tablet take 1 tablet by mouth once daily 90 tablet 2  . Multiple Vitamin (MULTIVITAMIN) tablet Take 1 tablet by mouth  daily.      . Omega-3 Fatty Acids (FISH OIL) 1000 MG CAPS Take by mouth 2 (two) times daily.    Marland Kitchen RA ALLERGY RELIEF 180 MG tablet take 1 tablet by mouth once daily 30 tablet 5   No current facility-administered medications on file prior to visit.     BP 138/84   Temp 98.1 F (36.7 C) (Oral)       Objective:   Physical Exam  Constitutional: She is oriented to person, place, and time. She appears well-developed and well-nourished. No distress.  HENT:  Head: Normocephalic and atraumatic.  Right Ear: External ear normal.  Left Ear: External ear normal.  Nose: Nose normal.  Mouth/Throat: Oropharynx is clear and moist.  Eyes: Conjunctivae and EOM are normal. Pupils are equal, round, and reactive to light. Right eye exhibits no discharge. Left eye exhibits no discharge. No scleral icterus.  Cardiovascular: Normal rate, regular rhythm, normal heart sounds and intact distal pulses.  Exam reveals no gallop and no friction rub.   No murmur heard. Pulmonary/Chest: Effort normal and breath sounds normal. No respiratory distress. She has no wheezes. She has no rales. She exhibits no tenderness.  Abdominal: Soft. Bowel sounds are normal. She exhibits no distension. There is no tenderness. There is CVA tenderness. There is no rebound and no guarding.  Neurological: She is alert and oriented to person, place, and time.  Skin: Skin is warm and dry. No rash noted. She is not diaphoretic. No erythema. No pallor.  Psychiatric: She has a normal mood and affect. Her behavior is normal. Judgment and thought content normal.  Nursing note and vitals reviewed.     Assessment & Plan:  1. Acute cystitis with hematuria  - POCT Urinalysis Dipstick (Automated) + Leuks, Nitrates, Protein, and blood  - Culture, Urine - ciprofloxacin (CIPRO) 500 MG tablet; Take 1 tablet (500 mg total) by mouth 2 (two) times daily.  Dispense: 6 tablet; Refill: 0 - Drink plenty of water and follow up if needed  2. Cough -  likely bronchial. Will treat as needed once chest x ray is resulted.  - DG Chest 2 View; Future  Dorothyann Peng, NP

## 2016-07-29 NOTE — Progress Notes (Signed)
Spoke with patient informed her of rest chest x-ray which was clear. We'll treat her cough with a prednisone taper. Follow-up if no improvement in next 2 or 3 days

## 2016-08-01 LAB — URINE CULTURE: Colony Count: >=100000

## 2016-09-09 ENCOUNTER — Ambulatory Visit (INDEPENDENT_AMBULATORY_CARE_PROVIDER_SITE_OTHER): Payer: Medicare Other | Admitting: Adult Health

## 2016-09-09 VITALS — BP 142/82 | Temp 97.9°F

## 2016-09-09 DIAGNOSIS — R05 Cough: Secondary | ICD-10-CM | POA: Diagnosis not present

## 2016-09-09 DIAGNOSIS — T464X5A Adverse effect of angiotensin-converting-enzyme inhibitors, initial encounter: Secondary | ICD-10-CM

## 2016-09-09 DIAGNOSIS — F329 Major depressive disorder, single episode, unspecified: Secondary | ICD-10-CM

## 2016-09-09 DIAGNOSIS — F418 Other specified anxiety disorders: Secondary | ICD-10-CM | POA: Diagnosis not present

## 2016-09-09 DIAGNOSIS — I10 Essential (primary) hypertension: Secondary | ICD-10-CM

## 2016-09-09 DIAGNOSIS — F419 Anxiety disorder, unspecified: Secondary | ICD-10-CM

## 2016-09-09 MED ORDER — LOSARTAN POTASSIUM 25 MG PO TABS: 25.0000 mg | ORAL_TABLET | Freq: Every day | ORAL | 1 refills | Status: DC

## 2016-09-09 MED ORDER — ALPRAZOLAM 1 MG PO TABS: ORAL_TABLET | ORAL | 0 refills | Status: DC

## 2016-09-09 MED ORDER — TRAMADOL HCL 50 MG PO TABS: 50.0000 mg | ORAL_TABLET | Freq: Three times a day (TID) | ORAL | 0 refills | Status: DC | PRN

## 2016-09-09 NOTE — Progress Notes (Signed)
Subjective:    Patient ID: Julie [name], female    DOB: October 21, 1945, 71 y.o.   MRN: [id number]  HPI  71 year old female who presents to the office today for continued cough since December. When I last saw her in January she had the same complaint about a dry constant cough that started around christmas. We had started her on lisinopril 5 mg a week prior to Christmas. During her last visit she was prescribed a prednisone taper that she reports did not work  Chest x ray in January was negative.   She denies any fevers, sinus pain, pressure or feeling ill   Review of Systems   Negative unless noted above in HPI   Past Medical History:  Diagnosis Date  . ALLERGIC RHINITIS   . Anxiety    chronic BZs  . Arthritis   . ARTHRITIS, TRAUMATIC, ANKLE   . Cataract   . Depression   . Dyslipidemia   . EXOGENOUS OBESITY   . GERD (gastroesophageal reflux disease)   . Hemorrhoids   . Hyperglycemia   . Melanoma in situ of left lower leg [facility])     Social History   Social History  . Marital status: Married    Spouse name: N/A  . Number of children: N/A  . Years of education: N/A   Occupational History  . Not on file.   Social History Main Topics  . Smoking status: Never Smoker  . Smokeless tobacco: Never Used  . Alcohol use No  . Drug use: No  . Sexual activity: Not on file   Other Topics Concern  . Not on file   Social History Narrative   Widowed (spouse - Ron of >44years prior to death)   widowed 2011/04/28 - 2 grown supportive dtrs, 5 g-kis (3 are triplets)    Past Surgical History:  Procedure Laterality Date  . ABDOMINAL HYSTERECTOMY  10/1978  . ANKLE ARTHROSCOPY Right 10/19/12   deorio  . arthroscopy left knee  05/2002, 04/2010  . BREAST SURGERY  11/1988   Benign mass (R) breast  . C. surgery history and scan sheet    . Cataract surgery     (L) eye 09/06/12 & (R) eye 10/11/12  . CHOLECYSTECTOMY  09/2004  . cysto sling  10/2006   3 times  . FRACTURE SURGERY     right ankle  . JOINT REPLACEMENT  06/2010   R ankle  . MASS EXCISION     under right arm  . removal multiple cyst breast left and right    . sling anterior repair  12/1999  . SPINE SURGERY     laminotomy with decompression  . Toe Exostosis  10/08   Bil    Family History  Problem Relation Age of Onset  . Hyperlipidemia Father   . Coronary artery disease Father     cabg 70yo  . Leukemia Father   . Hypertension Father   . Heart attack Brother 32    stents x 2 (72 & 82 yo)  . Diabetes Brother   . Diabetes Mother   . Hyperlipidemia Mother   . Hypertension Mother   . Colon cancer Neg Hx     Allergies  Allergen Reactions  . Hydrocodone-Acetaminophen     REACTION: hives, itiching  . Latex     Causes redness and itching  . Lisinopril Cough  . Oxycodone-Acetaminophen Itching    REACTION: itching    Current Outpatient Prescriptions on File Prior to Visit  Medication Sig Dispense Refill  . aspirin 81 MG tablet Take 81 mg by mouth daily.      Marland Kitchen atorvastatin (LIPITOR) 20 MG tablet take 1 tablet by mouth once daily 30 tablet 11  . Biotin 2500 MCG CAPS Take by mouth daily.    Marland Kitchen buPROPion (WELLBUTRIN XL) 300 MG 24 hr tablet Take 1 tablet (300 mg total) by mouth daily. 90 tablet 1  . Calcium Carbonate-Vitamin D (CALTRATE 600+D) 600-400 MG-UNIT per tablet Take 1 tablet by mouth daily.      . ciprofloxacin (CIPRO) 500 MG tablet Take 1 tablet (500 mg total) by mouth 2 (two) times daily. 6 tablet 0  . Multiple Vitamin (MULTIVITAMIN) tablet Take 1 tablet by mouth daily.      . Omega-3 Fatty Acids (FISH OIL) 1000 MG CAPS Take by mouth 2 (two) times daily.    . predniSONE (DELTASONE) 10 MG tablet 40 mg x 3 days, 20 mg x 3 days, 10 mg x 3 days 21 tablet 0  . RA ALLERGY RELIEF 180 MG tablet take 1 tablet by mouth once daily 30 tablet 5   No current facility-administered medications on file prior to visit.     BP (!) 142/82 (BP Location: Left Arm, Patient Position: Sitting, Cuff Size:  Normal)   Temp 97.9 F (36.6 C) (Oral)        Objective:   Physical Exam  Constitutional: She is oriented to person, place, and time. She appears well-developed and well-nourished. No distress.  Cardiovascular: Normal rate, regular rhythm, normal heart sounds and intact distal pulses.  Exam reveals no gallop and no friction rub.   No murmur heard. Pulmonary/Chest: Effort normal and breath sounds normal. No respiratory distress. She has no wheezes. She has no rales. She exhibits no tenderness.  Constant dry cough noted  Neurological: She is alert and oriented to person, place, and time. She has normal reflexes.  Skin: Skin is warm and dry. No rash noted. She is not diaphoretic. No erythema. No pallor.  Psychiatric: She has a normal mood and affect. Her behavior is normal. Judgment and thought content normal.  Nursing note and vitals reviewed.     Assessment & Plan:  1. Anxiety and depression  - ALPRAZolam (XANAX) 1 MG tablet; take 1 tablet by mouth two times a day if needed for anxiety  Dispense: 60 tablet; Refill: 0  2. Cough due to ACE inhibitor - Possibly due to lisinopril. Will d/c this med. Can take tramadol 50 mg if needed for cough at night.  - traMADol (ULTRAM) 50 MG tablet; Take 1 tablet (50 mg total) by mouth every 8 (eight) hours as needed.  Dispense: 10 tablet; Refill: 0  3. Essential hypertension - Will switch from Lisinopril for Cozaar - losartan (COZAAR) 25 MG tablet; Take 1 tablet (25 mg total) by mouth daily.  Dispense: 90 tablet; Refill: 1 - Return precautions given   Dorothyann Peng, NP

## 2016-09-09 NOTE — Patient Instructions (Addendum)
Stop lisinopril as I think this is causing your cough   I have changed you to Losartan to help with blood pressure control.   You can use Tramadol to help with your cough

## 2016-09-17 ENCOUNTER — Telehealth: Payer: Self-pay | Admitting: Adult Health

## 2016-09-17 NOTE — Telephone Encounter (Signed)
° ° °  Pt call today to say she still has a cough and was told by [facility] to call back if the cough did not get better.Would like a call back

## 2016-09-18 ENCOUNTER — Other Ambulatory Visit: Payer: Self-pay | Admitting: Adult Health

## 2016-09-18 MED ORDER — HYDROCODONE-HOMATROPINE 5-1.5 MG/5ML PO SYRP: 5.0000 mL | ORAL_SOLUTION | Freq: Three times a day (TID) | ORAL | 0 refills | Status: DC | PRN

## 2016-09-18 NOTE — Telephone Encounter (Signed)
Patient was seen 09/09/16 for a dry, constant cough. Patient states she is still having this issue and it is really bothering her. Does patient need to be seen in the office for re-evaluation?

## 2016-09-18 NOTE — Telephone Encounter (Signed)
Spoke to [place] and she informed me that she continued to have a dry constant cough. This cough has been consistent since December. During her last visit she was asked to stop lisinopril and she was switched to Losartan.   This has not helped her cough.   I advised that we send her to pulmonary to be evaluated and in the meantime, I would prescribe her Hycodan cough syrup and have her take OTC Omeprazole   She would like to try the Hycodan cough syrup and omeprazole before going to pulmonary   Prescription for Hycodan left at front desk

## 2016-10-11 ENCOUNTER — Emergency Department (HOSPITAL_COMMUNITY): Payer: Medicare Other

## 2016-10-11 ENCOUNTER — Encounter (HOSPITAL_COMMUNITY): Payer: Self-pay

## 2016-10-11 ENCOUNTER — Emergency Department (HOSPITAL_COMMUNITY)
Admission: EM | Admit: 2016-10-11 | Discharge: 2016-10-11 | Disposition: A | Discharge status: Home / Self Care | Payer: Medicare Other | Attending: Emergency Medicine | Admitting: Emergency Medicine

## 2016-10-11 DIAGNOSIS — G936 Cerebral edema: Secondary | ICD-10-CM | POA: Diagnosis not present

## 2016-10-11 DIAGNOSIS — S0990XA Unspecified injury of head, initial encounter: Secondary | ICD-10-CM

## 2016-10-11 DIAGNOSIS — Y929 Unspecified place or not applicable: Secondary | ICD-10-CM | POA: Insufficient documentation

## 2016-10-11 DIAGNOSIS — S0083XA Contusion of other part of head, initial encounter: Secondary | ICD-10-CM

## 2016-10-11 DIAGNOSIS — Z982 Presence of cerebrospinal fluid drainage device: Secondary | ICD-10-CM | POA: Diagnosis not present

## 2016-10-11 DIAGNOSIS — S63502A Unspecified sprain of left wrist, initial encounter: Secondary | ICD-10-CM

## 2016-10-11 DIAGNOSIS — S0081XA Abrasion of other part of head, initial encounter: Secondary | ICD-10-CM | POA: Insufficient documentation

## 2016-10-11 DIAGNOSIS — Z96661 Presence of right artificial ankle joint: Secondary | ICD-10-CM | POA: Insufficient documentation

## 2016-10-11 DIAGNOSIS — Z96651 Presence of right artificial knee joint: Secondary | ICD-10-CM | POA: Insufficient documentation

## 2016-10-11 DIAGNOSIS — Y939 Activity, unspecified: Secondary | ICD-10-CM | POA: Diagnosis not present

## 2016-10-11 DIAGNOSIS — I10 Essential (primary) hypertension: Secondary | ICD-10-CM | POA: Insufficient documentation

## 2016-10-11 DIAGNOSIS — S00211A Abrasion of right eyelid and periocular area, initial encounter: Secondary | ICD-10-CM | POA: Diagnosis not present

## 2016-10-11 DIAGNOSIS — Z91040 Latex allergy status: Secondary | ICD-10-CM | POA: Diagnosis not present

## 2016-10-11 DIAGNOSIS — W1830XA Fall on same level, unspecified, initial encounter: Secondary | ICD-10-CM | POA: Insufficient documentation

## 2016-10-11 DIAGNOSIS — R51 Headache: Secondary | ICD-10-CM | POA: Diagnosis not present

## 2016-10-11 DIAGNOSIS — S6992XA Unspecified injury of left wrist, hand and finger(s), initial encounter: Secondary | ICD-10-CM | POA: Diagnosis present

## 2016-10-11 DIAGNOSIS — Z79899 Other long term (current) drug therapy: Secondary | ICD-10-CM | POA: Insufficient documentation

## 2016-10-11 DIAGNOSIS — Y999 Unspecified external cause status: Secondary | ICD-10-CM | POA: Diagnosis not present

## 2016-10-11 DIAGNOSIS — T148XXA Other injury of unspecified body region, initial encounter: Secondary | ICD-10-CM

## 2016-10-11 MED ORDER — ACETAMINOPHEN 500 MG PO TABS: 1000.0000 mg | ORAL_TABLET | Freq: Four times a day (QID) | ORAL | 0 refills | Status: DC | PRN

## 2016-10-11 MED ORDER — TRAMADOL HCL 50 MG PO TABS: 50.0000 mg | ORAL_TABLET | Freq: Four times a day (QID) | ORAL | 0 refills | Status: DC | PRN

## 2016-10-11 MED ORDER — ACETAMINOPHEN 500 MG PO TABS
1000.0000 mg | ORAL_TABLET | Freq: Once | ORAL | Status: AC
  Administered 2016-10-11: 1000 mg via ORAL
  Filled 2016-10-11: qty 2

## 2016-10-11 NOTE — ED Triage Notes (Signed)
She tells me she tripped over a child at a church event this afternoon, thereby falling. She states she "face-planted". She has abrasion/edema and ecchymosis at right lat. Orbit/zygoma areas. She had ice at this area on arrival, which we maintain.  She is alert and oriented x 4 with clear speech. She states she is not sure whether or not she had a brief l.o.c.

## 2016-10-11 NOTE — ED Provider Notes (Signed)
[facility] DEPT Provider Note   CSN: [id number] Arrival date & time: 10/11/16  1703     History   Chief Complaint Chief Complaint  Patient presents with  . Facial Injury  . Fall    HPI Julie [name] is a 71 y.o. female.  HPI Patient was at a church event for an history content and tripped over a child. She fell forward landing essentially on her right face and left wrist. She reports that all happened very quickly, she doesn't think she had loss of consciousness but possibly briefly. She reports she has a lot of swelling on the forehead around the right eye and that's painful. Some general headache. Patient takes a daily aspirin. No other anticoagulants. She reports her vision may be slightly blurred. No double vision or loss of vision. No weakness numbness or paresthesia to extremities. No cervical pain. Mild pain left wrist but normal use without limitation. No other extremity injury or complaint of chest pain or abdominal pain. Past Medical History:  Diagnosis Date  . ALLERGIC RHINITIS   . Anxiety    chronic BZs  . Arthritis   . ARTHRITIS, TRAUMATIC, ANKLE   . Cataract   . Depression   . Dyslipidemia   . EXOGENOUS OBESITY   . GERD (gastroesophageal reflux disease)   . Hemorrhoids   . Hyperglycemia   . Melanoma in situ of left lower leg [facility])     Patient Active Problem List   Diagnosis Date Noted  . Essential hypertension 03/27/2016  . Hyperglycemia 02/03/2012  . Dyslipidemia 06/30/2011  . ARTHRITIS, TRAUMATIC, ANKLE 01/01/2010  . EXOGENOUS OBESITY 12/26/2008  . ANXIETY DEPRESSION 12/02/2007  . ALLERGIC RHINITIS 04/08/2007  . GERD 04/08/2007    Past Surgical History:  Procedure Laterality Date  . ABDOMINAL HYSTERECTOMY  10/1978  . ANKLE ARTHROSCOPY Right 10/19/12   deorio  . arthroscopy left knee  05/2002, 04/2010  . BREAST SURGERY  11/1988   Benign mass (R) breast  . C. surgery history and scan sheet    . Cataract surgery     (L) eye 09/06/12 & (R)  eye 10/11/12  . CHOLECYSTECTOMY  09/2004  . cysto sling  10/2006   3 times  . FRACTURE SURGERY     right ankle  . JOINT REPLACEMENT  06/2010   R ankle  . MASS EXCISION     under right arm  . removal multiple cyst breast left and right    . sling anterior repair  12/1999  . SPINE SURGERY     laminotomy with decompression  . Toe Exostosis  10/08   Bil  . TOTAL ANKLE REPLACEMENT Right     OB History    No data available       Home Medications    Prior to Admission medications   Medication Sig Start Date End Date Taking? Authorizing Provider  ALPRAZolam Duanne Moron) 1 MG tablet take 1 tablet by mouth two times a day if needed for anxiety 09/09/16  Yes Dorothyann Peng, NP  aspirin 81 MG tablet Take 81 mg by mouth daily.     Yes Historical Provider, MD  atorvastatin (LIPITOR) 20 MG tablet take 1 tablet by mouth once daily 03/13/16  Yes Dorothyann Peng, NP  Biotin 2500 MCG CAPS Take by mouth daily.   Yes Historical Provider, MD  buPROPion (WELLBUTRIN XL) 300 MG 24 hr tablet Take 1 tablet (300 mg total) by mouth daily. 03/27/16  Yes Dorothyann Peng, NP  Calcium Carbonate-Vitamin D (CALTRATE 600+D)  600-400 MG-UNIT per tablet Take 1 tablet by mouth daily.     Yes Historical Provider, MD  HYDROcodone-homatropine (HYCODAN) 5-1.5 MG/5ML syrup Take 5 mLs by mouth every 8 (eight) hours as needed for cough. 09/18/16  Yes Dorothyann Peng, NP  hydroxypropyl methylcellulose / hypromellose (ISOPTO TEARS / GONIOVISC) 2.5 % ophthalmic solution Place 2 drops into both eyes 2 (two) times daily as needed for dry eyes.   Yes Historical Provider, MD  losartan (COZAAR) 25 MG tablet Take 1 tablet (25 mg total) by mouth daily. 09/09/16  Yes Dorothyann Peng, NP  Multiple Vitamin (MULTIVITAMIN) tablet Take 1 tablet by mouth daily.     Yes Historical Provider, MD  Omega-3 Fatty Acids (FISH OIL) 1000 MG CAPS Take by mouth 2 (two) times daily.   Yes Historical Provider, MD  RA ALLERGY RELIEF 180 MG tablet take 1 tablet by mouth once  daily 10/27/13  Yes Rowe Clack, MD  acetaminophen (TYLENOL) 500 MG tablet Take 2 tablets (1,000 mg total) by mouth every 6 (six) hours as needed. 10/11/16   Charlesetta Shanks, MD  ciprofloxacin (CIPRO) 500 MG tablet Take 1 tablet (500 mg total) by mouth 2 (two) times daily. Patient not taking: Reported on 10/11/2016 07/29/16   Dorothyann Peng, NP  predniSONE (DELTASONE) 10 MG tablet 40 mg x 3 days, 20 mg x 3 days, 10 mg x 3 days Patient not taking: Reported on 10/11/2016 07/29/16   Dorothyann Peng, NP  traMADol (ULTRAM) 50 MG tablet Take 1 tablet (50 mg total) by mouth every 8 (eight) hours as needed. Patient not taking: Reported on 10/11/2016 09/09/16   Dorothyann Peng, NP  traMADol (ULTRAM) 50 MG tablet Take 1 tablet (50 mg total) by mouth every 6 (six) hours as needed. 1-2 every 6 hours as needed 10/11/16   Charlesetta Shanks, MD    Family History Family History  Problem Relation Age of Onset  . Hyperlipidemia Father   . Coronary artery disease Father     cabg 70yo  . Leukemia Father   . Hypertension Father   . Heart attack Brother 70    stents x 2 (13 & 52 yo)  . Diabetes Brother   . Diabetes Mother   . Hyperlipidemia Mother   . Hypertension Mother   . Colon cancer Neg Hx     Social History Social History  Substance Use Topics  . Smoking status: Never Smoker  . Smokeless tobacco: Never Used  . Alcohol use No     Allergies   Hydrocodone-acetaminophen; Latex; Lisinopril; and Oxycodone-acetaminophen   Review of Systems Review of Systems 10 Systems reviewed and are negative for acute change except as noted in the HPI.   Physical Exam Updated Vital Signs BP (!) 166/93   Pulse 86   Temp 98.3 F (36.8 C) (Oral)   Resp 18   SpO2 97%   Physical Exam  Constitutional: She is oriented to person, place, and time. She appears well-developed and well-nourished. No distress.  HENT:  Contusion right brow and zygoma with moderate swelling. Superficial abrasion to brow and zygoma with no  active bleeding. Patient does not have periorbital edema that causes lid closure at this time. Extraocular motions are normal. Bilateral TMs normal without hemotympanum. Nares patent with no bleeding. Posterior oropharynx widely patent. Normal range of motion of jaw.  Eyes: EOM are normal. Pupils are equal, round, and reactive to light.  No hyphema.  Neck: Neck supple.  No C-spine tenderness to palpation.  Cardiovascular: Normal rate,  regular rhythm, normal heart sounds and intact distal pulses.   Pulmonary/Chest: Effort normal and breath sounds normal. She exhibits no tenderness.  Abdominal: Soft. She exhibits no distension. There is no tenderness.  Musculoskeletal: Normal range of motion. She exhibits tenderness.  Mild tenderness radial aspect of left wrist. No deformity or effusion. Normal range of motion. Neurovascular exam is normal.  Neurological: She is alert and oriented to person, place, and time. No cranial nerve deficit. She exhibits normal muscle tone. Coordination normal.  Skin: Skin is warm and dry.  Psychiatric: She has a normal mood and affect.     ED Treatments / Results  Labs (all labs ordered are listed, but only abnormal results are displayed) Labs Reviewed - No data to display  EKG  EKG Interpretation None       Radiology Ct Head Wo Contrast  Result Date: 10/11/2016 CLINICAL DATA:  Tripped over a child at a tertiary bent this evening and fell trauma face planted normal abrasion edema and ecchymosis at RIGHT lateral orbit and zygoma area, uncertain loss of consciousness, facial and head pain, initial encounter EXAM: CT HEAD WITHOUT CONTRAST CT MAXILLOFACIAL WITHOUT CONTRAST TECHNIQUE: Multidetector CT imaging of the head and maxillofacial structures were performed using the standard protocol without intravenous contrast. Multiplanar CT image reconstructions of the maxillofacial structures were also generated. Right side of face marked with BB. COMPARISON:  None ;  correlation MR brain 12/30/2004 FINDINGS: CT HEAD FINDINGS Brain: Normal ventricular morphology. No midline shift or mass effect. Minimal small vessel chronic ischemic changes of deep cerebral white matter. No intracranial hemorrhage, mass lesion, or evidence acute infarction. No extra-axial fluid collections. Vascular: Unremarkable Skull: Intact Other: N/A CT MAXILLOFACIAL FINDINGS Osseous: Scattered beam hardening artifacts of dental origin. Normal TMJ alignment bilaterally. Minimal bowing of nasal septum to the RIGHT. No definite facial bone fractures. Small amount of periosteal new bone identified anterior to the anterior LEFT maxilla, question related to healing at site of an absent tooth #11. Orbits: Clear intraorbital soft tissue planes.  Bony orbits intact. Sinuses: Paranasal sinuses, mastoid air cells, and middle ear cavities clear bilaterally Soft tissues: RIGHT periorbital hematoma contusion. Remaining regional soft tissues clear. Symmetric parotid and submandibular glands. IMPRESSION: Minimal small vessel chronic ischemic changes of deep cerebral white matter. No acute intracranial abnormalities. No acute facial bone fracture seen. Periosteal new bone identified adjacent to the anterior LEFT maxilla, question related to absence of tooth #11 question prior dental extraction. Electronically Signed   By: Lavonia Dana M.D.   On: 10/11/2016 18:27   Ct Maxillofacial Wo Cm  Result Date: 10/11/2016 CLINICAL DATA:  Tripped over a child at a tertiary bent this evening and fell trauma face planted normal abrasion edema and ecchymosis at RIGHT lateral orbit and zygoma area, uncertain loss of consciousness, facial and head pain, initial encounter EXAM: CT HEAD WITHOUT CONTRAST CT MAXILLOFACIAL WITHOUT CONTRAST TECHNIQUE: Multidetector CT imaging of the head and maxillofacial structures were performed using the standard protocol without intravenous contrast. Multiplanar CT image reconstructions of the maxillofacial  structures were also generated. Right side of face marked with BB. COMPARISON:  None ; correlation MR brain 12/30/2004 FINDINGS: CT HEAD FINDINGS Brain: Normal ventricular morphology. No midline shift or mass effect. Minimal small vessel chronic ischemic changes of deep cerebral white matter. No intracranial hemorrhage, mass lesion, or evidence acute infarction. No extra-axial fluid collections. Vascular: Unremarkable Skull: Intact Other: N/A CT MAXILLOFACIAL FINDINGS Osseous: Scattered beam hardening artifacts of dental origin. Normal TMJ alignment bilaterally.  Minimal bowing of nasal septum to the RIGHT. No definite facial bone fractures. Small amount of periosteal new bone identified anterior to the anterior LEFT maxilla, question related to healing at site of an absent tooth #11. Orbits: Clear intraorbital soft tissue planes.  Bony orbits intact. Sinuses: Paranasal sinuses, mastoid air cells, and middle ear cavities clear bilaterally Soft tissues: RIGHT periorbital hematoma contusion. Remaining regional soft tissues clear. Symmetric parotid and submandibular glands. IMPRESSION: Minimal small vessel chronic ischemic changes of deep cerebral white matter. No acute intracranial abnormalities. No acute facial bone fracture seen. Periosteal new bone identified adjacent to the anterior LEFT maxilla, question related to absence of tooth #11 question prior dental extraction. Electronically Signed   By: Lavonia Dana M.D.   On: 10/11/2016 18:27    Procedures Procedures (including critical care time)  Medications Ordered in ED Medications  acetaminophen (TYLENOL) tablet 1,000 mg (1,000 mg Oral Given 10/11/16 1759)     Initial Impression / Assessment and Plan / ED Course  I have reviewed the triage vital signs and the nursing notes.  Pertinent labs & imaging results that were available during my care of the patient were reviewed by me and considered in my medical decision making (see chart for details).       Final Clinical Impressions(s) / ED Diagnoses   Final diagnoses:  Contusion of face, initial encounter  Abrasion  Injury of head, initial encounter  Sprain of left wrist, initial encounter   Patient is alert and appropriate. No neurologic dysfunction. CT negative for intracranial injury or facial fracture. Extraocular function is normal. No evidence of globe injury. Return precautions are reviewed. New Prescriptions New Prescriptions   ACETAMINOPHEN (TYLENOL) 500 MG TABLET    Take 2 tablets (1,000 mg total) by mouth every 6 (six) hours as needed.   TRAMADOL (ULTRAM) 50 MG TABLET    Take 1 tablet (50 mg total) by mouth every 6 (six) hours as needed. 1-2 every 6 hours as needed     Charlesetta Shanks, MD 10/11/16 [id number]

## 2016-10-11 NOTE — ED Notes (Signed)
Patient transported to CT 

## 2016-10-14 ENCOUNTER — Encounter: Payer: Self-pay | Admitting: Adult Health

## 2016-10-14 ENCOUNTER — Ambulatory Visit (INDEPENDENT_AMBULATORY_CARE_PROVIDER_SITE_OTHER): Payer: Medicare Other | Admitting: Adult Health

## 2016-10-14 VITALS — BP 132/68 | Temp 98.0°F

## 2016-10-14 DIAGNOSIS — S0511XA Contusion of eyeball and orbital tissues, right eye, initial encounter: Secondary | ICD-10-CM | POA: Diagnosis not present

## 2016-10-14 NOTE — Progress Notes (Signed)
Subjective:    Patient ID: Julie [name], female    DOB: 08/02/1945, 71 y.o.   MRN: [id number]  HPI  71 year old female who  has a past medical history of ALLERGIC RHINITIS; Anxiety; Arthritis; ARTHRITIS, TRAUMATIC, ANKLE; Cataract; Depression; Dyslipidemia; EXOGENOUS OBESITY; GERD (gastroesophageal reflux disease); Hemorrhoids; Hyperglycemia; and Melanoma in situ of left lower leg ([place]). She presents to the office today after being seen in the ER on 10/11/2016 with contusion to right eye after tripping over a grandchild at an easter egg hunt.   Per ER note:   Patient was at a church event for an history content and tripped over a child. She fell forward landing essentially on her right face and left wrist. She reports that all happened very quickly, she doesn't think she had loss of consciousness but possibly briefly. She reports she has a lot of swelling on the forehead around the right eye and that's painful. Some general headache. Patient takes a daily aspirin. No other anticoagulants. She reports her vision may be slightly blurred. No double vision or loss of vision. No weakness numbness or paresthesia to extremities. No cervical pain. Mild pain left wrist but normal use without limitation. No other extremity injury or complaint of chest pain or abdominal pain.  She was prescribed Toradol and Motrin for pain relief.   Today she reports that she is "improving". She reports decreased swelling around the right eye. She has not had any blurred vision or constant headache. She does have tenderness around orbit.   CT scan negative    Review of Systems See HPI   Past Medical History:  Diagnosis Date  . ALLERGIC RHINITIS   . Anxiety    chronic BZs  . Arthritis   . ARTHRITIS, TRAUMATIC, ANKLE   . Cataract   . Depression   . Dyslipidemia   . EXOGENOUS OBESITY   . GERD (gastroesophageal reflux disease)   . Hemorrhoids   . Hyperglycemia   . Melanoma in situ of left lower leg [facility])      Social History   Social History  . Marital status: Married    Spouse name: N/A  . Number of children: N/A  . Years of education: N/A   Occupational History  . Not on file.   Social History Main Topics  . Smoking status: Never Smoker  . Smokeless tobacco: Never Used  . Alcohol use No  . Drug use: No  . Sexual activity: Not on file   Other Topics Concern  . Not on file   Social History Narrative   Widowed (spouse - Ron of >44years prior to death)   widowed 2011/05/11 - 2 grown supportive dtrs, 5 g-kis (3 are triplets)    Past Surgical History:  Procedure Laterality Date  . ABDOMINAL HYSTERECTOMY  10/1978  . ANKLE ARTHROSCOPY Right 10/19/12   deorio  . arthroscopy left knee  05/2002, 04/2010  . BREAST SURGERY  11/1988   Benign mass (R) breast  . C. surgery history and scan sheet    . Cataract surgery     (L) eye 09/06/12 & (R) eye 10/11/12  . CHOLECYSTECTOMY  09/2004  . cysto sling  10/2006   3 times  . FRACTURE SURGERY     right ankle  . JOINT REPLACEMENT  06/2010   R ankle  . MASS EXCISION     under right arm  . removal multiple cyst breast left and right    . sling anterior repair  12/1999  . SPINE SURGERY     laminotomy with decompression  . Toe Exostosis  10/08   Bil  . TOTAL ANKLE REPLACEMENT Right     Family History  Problem Relation Age of Onset  . Hyperlipidemia Father   . Coronary artery disease Father     cabg 70yo  . Leukemia Father   . Hypertension Father   . Heart attack Brother 42    stents x 2 (69 & 4 yo)  . Diabetes Brother   . Diabetes Mother   . Hyperlipidemia Mother   . Hypertension Mother   . Colon cancer Neg Hx     Allergies  Allergen Reactions  . Hydrocodone-Acetaminophen     REACTION: hives, itiching  . Latex     Causes redness and itching  . Lisinopril Cough  . Oxycodone-Acetaminophen Itching    REACTION: itching    Current Outpatient Prescriptions on File Prior to Visit  Medication Sig Dispense Refill  .  acetaminophen (TYLENOL) 500 MG tablet Take 2 tablets (1,000 mg total) by mouth every 6 (six) hours as needed. 30 tablet 0  . ALPRAZolam (XANAX) 1 MG tablet take 1 tablet by mouth two times a day if needed for anxiety 60 tablet 0  . aspirin 81 MG tablet Take 81 mg by mouth daily.      Marland Kitchen atorvastatin (LIPITOR) 20 MG tablet take 1 tablet by mouth once daily 30 tablet 11  . Biotin 2500 MCG CAPS Take by mouth daily.    Marland Kitchen buPROPion (WELLBUTRIN XL) 300 MG 24 hr tablet Take 1 tablet (300 mg total) by mouth daily. 90 tablet 1  . Calcium Carbonate-Vitamin D (CALTRATE 600+D) 600-400 MG-UNIT per tablet Take 1 tablet by mouth daily.      Marland Kitchen losartan (COZAAR) 25 MG tablet Take 1 tablet (25 mg total) by mouth daily. 90 tablet 1  . Multiple Vitamin (MULTIVITAMIN) tablet Take 1 tablet by mouth daily.      . Omega-3 Fatty Acids (FISH OIL) 1000 MG CAPS Take by mouth 2 (two) times daily.    Marland Kitchen RA ALLERGY RELIEF 180 MG tablet take 1 tablet by mouth once daily 30 tablet 5  . traMADol (ULTRAM) 50 MG tablet Take 1 tablet (50 mg total) by mouth every 6 (six) hours as needed. 1-2 every 6 hours as needed 20 tablet 0   No current facility-administered medications on file prior to visit.     BP 132/68 (BP Location: Right Arm, Patient Position: Sitting, Cuff Size: Normal)   Temp 98 F (36.7 C) (Oral)       Objective:   Physical Exam  Constitutional: She is oriented to person, place, and time. She appears well-developed and well-nourished. No distress.  HENT:  Head: Normocephalic and atraumatic.  Right Ear: External ear normal.  Left Ear: External ear normal.  Nose: Nose normal.  Mouth/Throat: Oropharynx is clear and moist.  Eyes: Conjunctivae and EOM are normal. Pupils are equal, round, and reactive to light. Right eye exhibits no discharge. Left eye exhibits no discharge. No scleral icterus.  Neck: Normal range of motion. Neck supple.  Cardiovascular: Normal rate, regular rhythm, normal heart sounds and intact  distal pulses.  Exam reveals no gallop and no friction rub.   No murmur heard. Pulmonary/Chest: Effort normal and breath sounds normal. No respiratory distress. She has no wheezes. She has no rales. She exhibits no tenderness.  Lymphadenopathy:    She has no cervical adenopathy.  Neurological: She is alert and oriented  to person, place, and time.  Skin: Skin is warm and dry. No rash noted. She is not diaphoretic. No erythema. No pallor.  Bruising in various stages of healing around right orbit   Psychiatric: She has a normal mood and affect. Her behavior is normal. Judgment and thought content normal.  Nursing note and vitals reviewed.     Assessment & Plan:  1. Contusion of globe of right eye, initial encounter - Continue with current pain regimen  - Can add ice - Return precautions given   Dorothyann Peng, NP

## 2016-12-02 ENCOUNTER — Ambulatory Visit (INDEPENDENT_AMBULATORY_CARE_PROVIDER_SITE_OTHER): Payer: Medicare Other

## 2016-12-02 ENCOUNTER — Encounter (INDEPENDENT_AMBULATORY_CARE_PROVIDER_SITE_OTHER): Payer: Self-pay | Admitting: Orthopaedic Surgery

## 2016-12-02 ENCOUNTER — Ambulatory Visit (INDEPENDENT_AMBULATORY_CARE_PROVIDER_SITE_OTHER): Payer: Medicare Other | Admitting: Orthopaedic Surgery

## 2016-12-02 VITALS — BP 116/69 | HR 76 | Resp 14 | Ht 67.0 in | Wt 175.0 lb

## 2016-12-02 DIAGNOSIS — M25562 Pain in left knee: Secondary | ICD-10-CM

## 2016-12-02 DIAGNOSIS — G8929 Other chronic pain: Secondary | ICD-10-CM

## 2016-12-02 MED ORDER — METHYLPREDNISOLONE ACETATE 40 MG/ML IJ SUSP
80.0000 mg | INTRAMUSCULAR | Status: AC | PRN
  Administered 2016-12-02: 80 mg

## 2016-12-02 MED ORDER — BUPIVACAINE HCL 0.5 % IJ SOLN
3.0000 mL | INTRAMUSCULAR | Status: AC | PRN
  Administered 2016-12-02: 3 mL via INTRA_ARTICULAR

## 2016-12-02 MED ORDER — LIDOCAINE HCL 1 % IJ SOLN
5.0000 mL | INTRAMUSCULAR | Status: AC | PRN
  Administered 2016-12-02: 5 mL

## 2016-12-02 NOTE — Progress Notes (Signed)
Office Visit Note   Patient: Julie [name]           Date of Birth: 1946-01-26           MRN: [id number] Visit Date: 12/02/2016              Requested by: Dorothyann Peng, NP [place], [place] [address] PCP: Dorothyann Peng, NP   Assessment & Plan: Visit Diagnoses:  1. Chronic pain of left knee    Tricompartmental degenerative arthritis left knee Plan: Long discussion regarding different treatment options. We'll proceed with a cortisone injection in  the medial compartment today. Have discussed Visco supplementation and even total knee replacement .   Orders:  Orders Placed This Encounter  Procedures  . XR KNEE 3 VIEW LEFT   No orders of the defined types were placed in this encounter.     Procedures: Large Joint Inj Date/Time: 12/02/2016 11:08 AM Performed by: Garald Balding Authorized by: Garald Balding   Consent Given by:  Patient Timeout: prior to procedure the correct patient, procedure, and site was verified   Indications:  Pain and joint swelling Location:  Knee Site:  L knee Prep: patient was prepped and draped in usual sterile fashion   Needle Size:  25 G Needle Length:  1.5 inches Approach:  Anteromedial Ultrasound Guidance: No   Fluoroscopic Guidance: No   Arthrogram: No   Medications:  5 mL lidocaine 1 %; 80 mg methylPREDNISolone acetate 40 MG/ML; 3 mL bupivacaine 0.5 % Aspiration Attempted: No   Patient tolerance:  Patient tolerated the procedure well with no immediate complications     Clinical Data: No additional findings.   Subjective: Chief Complaint  Patient presents with  . Left Knee - Pain, Edema    Julie [name] is a 71 y o that presents with chronic L knee pain. Hx of 2 L knee arthroscopy from [facility]. Walking up and down stairs, twisting, kneeling makes it hurt. Euflexx 6 yrs ago.has not tried cortisone, not diabetic.  HPI  Julie [name]'s had a chronic problem with her left knee dating back a number of years.  She's had a prior arthroscopic debridement by both Dr. French Ana and Dr. Alphonzo Cruise .she's having difficulty particularly on the medial aspect of her knee with sleeping at night, bending stooping and squatting. She denies any history of injury or trauma. She's had a chronic problem with her left ankle and required numerous surgeries at [facility] culminating in an ankle replacement.  Review of Systems   Objective: Vital Signs: BP 116/69   Pulse 76   Resp 14   Ht 5\' 7"  (1.702 m)   Wt 175 lb (79.4 kg)   BMI 27.41 kg/m   Physical Exam  Ortho ExamLeft knee exam without effusion. Mild varus with weightbearing. Predominantly medial compartment discomfort with small osteophytes palpated. Positive patellar crepitation. Full extension and flexion 120. No instability. No popliteal mass or pain. No calf pain. No distal edema. Neurovascular exam intact.   Specialty Comments:  No specialty comments available.  Imaging: Xr Knee 3 View Left  Result Date: 12/02/2016 Films of the left knee were in 3 projections standing. There is significant degenerative change in the medial compartment with large peripheral osteophytes and subchondral sclerosis. Joint spaces are regular and narrow. This approximately 1 of varus while osteophytes identified in the lateral compartment but the joint is opened. There are degenerative changes the patellofemoral joint as well. All the above consistent with tricompartmental degenerative changes predominantly  in the medial compartment    PMFS History: Patient Active Problem List   Diagnosis Date Noted  . Essential hypertension 03/27/2016  . Hyperglycemia 02/03/2012  . Dyslipidemia 06/30/2011  . ARTHRITIS, TRAUMATIC, ANKLE 01/01/2010  . EXOGENOUS OBESITY 12/26/2008  . ANXIETY DEPRESSION 12/02/2007  . ALLERGIC RHINITIS 04/08/2007  . GERD 04/08/2007   Past Medical History:  Diagnosis Date  . ALLERGIC RHINITIS   . Anxiety    chronic BZs  . Arthritis   . ARTHRITIS,  TRAUMATIC, ANKLE   . Cataract   . Depression   . Dyslipidemia   . EXOGENOUS OBESITY   . GERD (gastroesophageal reflux disease)   . Hemorrhoids   . Hyperglycemia   . Melanoma in situ of left lower leg ([place])     Family History  Problem Relation Age of Onset  . Hyperlipidemia Father   . Coronary artery disease Father        cabg 70yo  . Leukemia Father   . Hypertension Father   . Heart attack Brother 75       stents x 2 (44 & 88 yo)  . Diabetes Brother   . Diabetes Mother   . Hyperlipidemia Mother   . Hypertension Mother   . Colon cancer Neg Hx     Past Surgical History:  Procedure Laterality Date  . ABDOMINAL HYSTERECTOMY  10/1978  . ANKLE ARTHROSCOPY Right 10/19/12   deorio  . arthroscopy left knee  05/2002, 04/2010  . BREAST SURGERY  11/1988   Benign mass (R) breast  . C. surgery history and scan sheet    . Cataract surgery     (L) eye 09/06/12 & (R) eye 10/11/12  . CHOLECYSTECTOMY  09/2004  . cysto sling  10/2006   3 times  . FRACTURE SURGERY     right ankle  . JOINT REPLACEMENT  06/2010   R ankle  . MASS EXCISION     under right arm  . removal multiple cyst breast left and right    . sling anterior repair  12/1999  . SPINE SURGERY     laminotomy with decompression  . Toe Exostosis  10/08   Bil  . TOTAL ANKLE REPLACEMENT Right    Social History   Occupational History  . Not on file.   Social History Main Topics  . Smoking status: Never Smoker  . Smokeless tobacco: Never Used  . Alcohol use No  . Drug use: No  . Sexual activity: Not on file     Garald Balding, MD   Note - This record has been created using [employer].  Chart creation errors have been sought, but may not always  have been located. Such creation errors do not reflect on  the standard of medical care.

## 2016-12-11 ENCOUNTER — Other Ambulatory Visit: Payer: Self-pay | Admitting: Adult Health

## 2016-12-11 DIAGNOSIS — F329 Major depressive disorder, single episode, unspecified: Secondary | ICD-10-CM

## 2016-12-11 DIAGNOSIS — F419 Anxiety disorder, unspecified: Principal | ICD-10-CM

## 2016-12-12 NOTE — Telephone Encounter (Signed)
Ok to refill 

## 2016-12-12 NOTE — Telephone Encounter (Signed)
Rx has been called in as directed.  

## 2017-01-05 ENCOUNTER — Other Ambulatory Visit: Payer: Self-pay | Admitting: Adult Health

## 2017-01-05 DIAGNOSIS — F419 Anxiety disorder, unspecified: Principal | ICD-10-CM

## 2017-01-05 DIAGNOSIS — F329 Major depressive disorder, single episode, unspecified: Secondary | ICD-10-CM

## 2017-02-18 DIAGNOSIS — H524 Presbyopia: Secondary | ICD-10-CM | POA: Diagnosis not present

## 2017-02-18 DIAGNOSIS — H02831 Dermatochalasis of right upper eyelid: Secondary | ICD-10-CM | POA: Diagnosis not present

## 2017-02-18 DIAGNOSIS — H02832 Dermatochalasis of right lower eyelid: Secondary | ICD-10-CM | POA: Diagnosis not present

## 2017-02-18 DIAGNOSIS — Z961 Presence of intraocular lens: Secondary | ICD-10-CM | POA: Diagnosis not present

## 2017-02-18 DIAGNOSIS — H04123 Dry eye syndrome of bilateral lacrimal glands: Secondary | ICD-10-CM | POA: Diagnosis not present

## 2017-02-18 DIAGNOSIS — H5213 Myopia, bilateral: Secondary | ICD-10-CM | POA: Diagnosis not present

## 2017-03-10 ENCOUNTER — Other Ambulatory Visit: Payer: Self-pay | Admitting: Adult Health

## 2017-03-10 DIAGNOSIS — F32A Depression, unspecified: Secondary | ICD-10-CM

## 2017-03-10 DIAGNOSIS — F419 Anxiety disorder, unspecified: Secondary | ICD-10-CM

## 2017-03-10 DIAGNOSIS — F329 Major depressive disorder, single episode, unspecified: Secondary | ICD-10-CM

## 2017-03-10 DIAGNOSIS — I10 Essential (primary) hypertension: Secondary | ICD-10-CM

## 2017-03-10 DIAGNOSIS — E785 Hyperlipidemia, unspecified: Secondary | ICD-10-CM

## 2017-03-11 NOTE — Telephone Encounter (Signed)
Xanax Last filled on 12/12/28 for #30 Last seen on 10/14/16/acute visit NO future appointments scheduled Please advise.  Thanks!!

## 2017-03-11 NOTE — Telephone Encounter (Signed)
Ok to refill non controlled substances for 90 days.  OK to refill Xanax for 30 days.   She is due for a CPE

## 2017-03-12 ENCOUNTER — Telehealth: Payer: Self-pay | Admitting: Adult Health

## 2017-03-12 ENCOUNTER — Telehealth: Payer: Self-pay | Admitting: Family Medicine

## 2017-03-12 NOTE — Telephone Encounter (Signed)
Sent to the pharmacy by e-scribe.  See refill request 

## 2017-03-12 NOTE — Telephone Encounter (Signed)
Pt is now due for yearly per [facility].  Please help the pt to get on the schedule.  Have her come fasting for lab work.  Thanks!!

## 2017-03-12 NOTE — Telephone Encounter (Signed)
° ° °  Pt request refill of the following: pt is going out of town this afternoon    atorvastatin (LIPITOR) 20 MG tablet   Phamacy: [facility] [address] closed so pt is now using [facility]

## 2017-03-12 NOTE — Telephone Encounter (Signed)
Non controlled sent in for 90 days Controlled called in and left on machine for 30 days. Will send a message to scheduling.

## 2017-04-03 ENCOUNTER — Encounter: Payer: Self-pay | Admitting: Adult Health

## 2017-04-18 DIAGNOSIS — Z23 Encounter for immunization: Secondary | ICD-10-CM | POA: Diagnosis not present

## 2017-04-29 DIAGNOSIS — D224 Melanocytic nevi of scalp and neck: Secondary | ICD-10-CM | POA: Diagnosis not present

## 2017-04-29 DIAGNOSIS — L918 Other hypertrophic disorders of the skin: Secondary | ICD-10-CM | POA: Diagnosis not present

## 2017-04-29 DIAGNOSIS — L2089 Other atopic dermatitis: Secondary | ICD-10-CM | POA: Diagnosis not present

## 2017-04-29 DIAGNOSIS — D1801 Hemangioma of skin and subcutaneous tissue: Secondary | ICD-10-CM | POA: Diagnosis not present

## 2017-04-29 DIAGNOSIS — L821 Other seborrheic keratosis: Secondary | ICD-10-CM | POA: Diagnosis not present

## 2017-04-29 DIAGNOSIS — L738 Other specified follicular disorders: Secondary | ICD-10-CM | POA: Diagnosis not present

## 2017-04-29 DIAGNOSIS — D2272 Melanocytic nevi of left lower limb, including hip: Secondary | ICD-10-CM | POA: Diagnosis not present

## 2017-04-29 DIAGNOSIS — L820 Inflamed seborrheic keratosis: Secondary | ICD-10-CM | POA: Diagnosis not present

## 2017-04-29 DIAGNOSIS — Z85820 Personal history of malignant melanoma of skin: Secondary | ICD-10-CM | POA: Diagnosis not present

## 2017-04-29 DIAGNOSIS — D2222 Melanocytic nevi of left ear and external auricular canal: Secondary | ICD-10-CM | POA: Diagnosis not present

## 2017-05-22 DIAGNOSIS — Z1231 Encounter for screening mammogram for malignant neoplasm of breast: Secondary | ICD-10-CM | POA: Diagnosis not present

## 2017-05-22 LAB — HM MAMMOGRAPHY

## 2017-05-26 ENCOUNTER — Other Ambulatory Visit: Payer: Self-pay | Admitting: Adult Health

## 2017-05-26 DIAGNOSIS — F419 Anxiety disorder, unspecified: Principal | ICD-10-CM

## 2017-05-26 DIAGNOSIS — F329 Major depressive disorder, single episode, unspecified: Secondary | ICD-10-CM

## 2017-05-27 ENCOUNTER — Encounter: Payer: Self-pay | Admitting: Family Medicine

## 2017-05-27 NOTE — Telephone Encounter (Signed)
Denied.  Pt needs appt with [facility] for further refills.  Message sent to the pharmacy.

## 2017-05-28 ENCOUNTER — Ambulatory Visit (INDEPENDENT_AMBULATORY_CARE_PROVIDER_SITE_OTHER): Payer: Medicare Other | Admitting: Adult Health

## 2017-05-28 ENCOUNTER — Encounter: Payer: Self-pay | Admitting: Adult Health

## 2017-05-28 VITALS — BP 126/80 | Temp 98.2°F | Wt 202.0 lb

## 2017-05-28 DIAGNOSIS — F419 Anxiety disorder, unspecified: Secondary | ICD-10-CM | POA: Diagnosis not present

## 2017-05-28 DIAGNOSIS — R300 Dysuria: Secondary | ICD-10-CM | POA: Diagnosis not present

## 2017-05-28 DIAGNOSIS — F329 Major depressive disorder, single episode, unspecified: Secondary | ICD-10-CM

## 2017-05-28 LAB — POC URINALSYSI DIPSTICK (AUTOMATED)
BILIRUBIN UA: NEGATIVE
Blood, UA: NEGATIVE
Glucose, UA: NEGATIVE
Ketones, UA: NEGATIVE
LEUKOCYTES UA: NEGATIVE
NITRITE UA: NEGATIVE
PH UA: 8.5 — AB (ref 5.0–8.0)
PROTEIN UA: NEGATIVE
Spec Grav, UA: 1.01 (ref 1.010–1.025)
Urobilinogen, UA: 0.2 E.U./dL

## 2017-05-28 MED ORDER — CIPROFLOXACIN HCL 500 MG PO TABS: 500.0000 mg | ORAL_TABLET | Freq: Two times a day (BID) | ORAL | 0 refills | Status: DC

## 2017-05-28 MED ORDER — ALPRAZOLAM 1 MG PO TABS: 1.0000 mg | ORAL_TABLET | Freq: Two times a day (BID) | ORAL | 0 refills | Status: DC | PRN

## 2017-05-28 NOTE — Progress Notes (Signed)
Subjective:    Patient ID: Julie [name], female    DOB: [birth year], 71 y.o.   MRN: [id number]  Urinary Tract Infection   This is a recurrent problem. The current episode started in the past 7 days. The problem occurs every urination. The problem has been gradually worsening. The quality of the pain is described as burning. There has been no fever. She is not sexually active. There is no history of pyelonephritis. Associated symptoms include flank pain, frequency, hesitancy and urgency. Pertinent negatives include no chills, hematuria or sweats. She has tried acetaminophen for the symptoms. The treatment provided mild relief.   She also needs a refill of Xanax    Review of Systems  Constitutional: Negative for chills.  Respiratory: Negative.   Cardiovascular: Negative.   Genitourinary: Positive for dysuria, flank pain, frequency, hesitancy, pelvic pain and urgency. Negative for hematuria.  Neurological: Negative.    See HPI   Past Medical History:  Diagnosis Date  . ALLERGIC RHINITIS   . Anxiety    chronic BZs  . Arthritis   . ARTHRITIS, TRAUMATIC, ANKLE   . Cataract   . Depression   . Dyslipidemia   . EXOGENOUS OBESITY   . GERD (gastroesophageal reflux disease)   . Hemorrhoids   . Hyperglycemia   . Melanoma in situ of left lower leg ([place])     Social History   Socioeconomic History  . Marital status: Married    Spouse name: Not on file  . Number of children: Not on file  . Years of education: Not on file  . Highest education level: Not on file  Social Needs  . Financial resource strain: Not on file  . Food insecurity - worry: Not on file  . Food insecurity - inability: Not on file  . Transportation needs - medical: Not on file  . Transportation needs - non-medical: Not on file  Occupational History  . Not on file  Tobacco Use  . Smoking status: Never Smoker  . Smokeless tobacco: Never Used  Substance and Sexual Activity  . Alcohol use: No  . Drug use:  No  . Sexual activity: Not on file  Other Topics Concern  . Not on file  Social History Narrative   Widowed (spouse - Ron of >44years prior to death)   widowed Apr 14, 2011 - 2 grown supportive dtrs, 5 g-kis (3 are triplets)    Past Surgical History:  Procedure Laterality Date  . ABDOMINAL HYSTERECTOMY  10/1978  . ANKLE ARTHROSCOPY Right 10/19/12   deorio  . arthroscopy left knee  05/2002, 04/2010  . BREAST SURGERY  11/1988   Benign mass (R) breast  . C. surgery history and scan sheet    . Cataract surgery     (L) eye 09/06/12 & (R) eye 10/11/12  . CHOLECYSTECTOMY  09/2004  . cysto sling  10/2006   3 times  . FRACTURE SURGERY     right ankle  . JOINT REPLACEMENT  06/2010   R ankle  . MASS EXCISION     under right arm  . removal multiple cyst breast left and right    . sling anterior repair  12/1999  . SPINE SURGERY     laminotomy with decompression  . Toe Exostosis  10/08   Bil  . TOTAL ANKLE REPLACEMENT Right     Family History  Problem Relation Age of Onset  . Hyperlipidemia Father   . Coronary artery disease Father  cabg 70yo  . Leukemia Father   . Hypertension Father   . Heart attack Brother 58       stents x 2 (27 & 29 yo)  . Diabetes Brother   . Diabetes Mother   . Hyperlipidemia Mother   . Hypertension Mother   . Colon cancer Neg Hx     Allergies  Allergen Reactions  . Hydrocodone-Acetaminophen     REACTION: hives, itiching  . Latex     Causes redness and itching  . Lisinopril Cough  . Oxycodone-Acetaminophen Itching    REACTION: itching    Current Outpatient Medications on File Prior to Visit  Medication Sig Dispense Refill  . acetaminophen (TYLENOL) 500 MG tablet Take 2 tablets (1,000 mg total) by mouth every 6 (six) hours as needed. 30 tablet 0  . ALPRAZolam (XANAX) 1 MG tablet TAKE 1 TABLET BY MOUTH TWICE DAILY AS NEEDED FOR ANXIETY 60 tablet 0  . aspirin 81 MG tablet Take 81 mg by mouth daily.      Marland Kitchen atorvastatin (LIPITOR) 20 MG tablet TAKE 1  TABLET BY MOUTH ONCE DAILY 90 tablet 0  . Biotin 2500 MCG CAPS Take by mouth daily.    Marland Kitchen buPROPion (WELLBUTRIN XL) 300 MG 24 hr tablet take 1 tablet by mouth once daily 90 tablet 1  . Calcium Carbonate-Vitamin D (CALTRATE 600+D) 600-400 MG-UNIT per tablet Take 1 tablet by mouth daily.      Marland Kitchen losartan (COZAAR) 25 MG tablet TAKE 1 TABLET BY MOUTH ONCE DAILY 90 tablet 0  . Multiple Vitamin (MULTIVITAMIN) tablet Take 1 tablet by mouth daily.      . Omega-3 Fatty Acids (FISH OIL) 1000 MG CAPS Take by mouth 2 (two) times daily.    Marland Kitchen RA ALLERGY RELIEF 180 MG tablet take 1 tablet by mouth once daily 30 tablet 5   No current facility-administered medications on file prior to visit.     BP 126/80 (BP Location: Left Arm)   Temp 98.2 F (36.8 C) (Oral)   Wt 202 lb (91.6 kg)   BMI 31.64 kg/m       Objective:   Physical Exam  Constitutional: She is oriented to person, place, and time. She appears well-developed and well-nourished. No distress.  Cardiovascular: Normal rate, regular rhythm, normal heart sounds and intact distal pulses. Exam reveals no gallop.  No murmur heard. Pulmonary/Chest: Effort normal and breath sounds normal. No respiratory distress. She has no wheezes. She has no rales. She exhibits no tenderness.  Abdominal: There is no hepatosplenomegaly, splenomegaly or hepatomegaly. There is tenderness in the periumbilical area. There is CVA tenderness.  Neurological: She is alert and oriented to person, place, and time.  Skin: Skin is warm and dry. No rash noted. She is not diaphoretic. No erythema. No pallor.  Psychiatric: She has a normal mood and affect. Her behavior is normal. Judgment and thought content normal.  Nursing note and vitals reviewed.     Assessment & Plan:  1. Dysuria  - POCT Urinalysis Dipstick (Automated)- cloudy and odorous. Will treat and get culture do to symptoms  - Urine Culture - ciprofloxacin (CIPRO) 500 MG tablet; Take 1 tablet (500 mg total) 2 (two)  times daily by mouth.  Dispense: 6 tablet; Refill: 0  2. Anxiety and depression  - ALPRAZolam (XANAX) 1 MG tablet; Take 1 tablet (1 mg total) 2 (two) times daily as needed by mouth. for anxiety  Dispense: 60 tablet; Refill: 0  Dorothyann Peng, NP

## 2017-05-30 LAB — URINE CULTURE
MICRO NUMBER:: 81290064
SPECIMEN QUALITY:: ADEQUATE

## 2017-06-08 ENCOUNTER — Other Ambulatory Visit: Payer: Self-pay | Admitting: Adult Health

## 2017-06-08 DIAGNOSIS — E785 Hyperlipidemia, unspecified: Secondary | ICD-10-CM

## 2017-06-09 NOTE — Telephone Encounter (Signed)
Sent to the pharmacy by e-scribe for 90 days.  Pt has upcoming appt on 06/29/17.

## 2017-06-29 ENCOUNTER — Encounter: Payer: Self-pay | Admitting: Adult Health

## 2017-06-29 ENCOUNTER — Ambulatory Visit (INDEPENDENT_AMBULATORY_CARE_PROVIDER_SITE_OTHER): Payer: Medicare Other | Admitting: Adult Health

## 2017-06-29 VITALS — BP 124/78 | Temp 97.7°F | Ht 67.25 in | Wt 203.0 lb

## 2017-06-29 DIAGNOSIS — E669 Obesity, unspecified: Secondary | ICD-10-CM

## 2017-06-29 DIAGNOSIS — E785 Hyperlipidemia, unspecified: Secondary | ICD-10-CM

## 2017-06-29 DIAGNOSIS — R739 Hyperglycemia, unspecified: Secondary | ICD-10-CM | POA: Diagnosis not present

## 2017-06-29 DIAGNOSIS — F341 Dysthymic disorder: Secondary | ICD-10-CM

## 2017-06-29 DIAGNOSIS — I10 Essential (primary) hypertension: Secondary | ICD-10-CM | POA: Diagnosis not present

## 2017-06-29 LAB — CBC WITH DIFFERENTIAL/PLATELET
BASOS PCT: 0.5 % (ref 0.0–3.0)
Basophils Absolute: 0 10*3/uL (ref 0.0–0.1)
EOS PCT: 2.3 % (ref 0.0–5.0)
Eosinophils Absolute: 0.2 10*3/uL (ref 0.0–0.7)
HCT: 38 % (ref 36.0–46.0)
Hemoglobin: 12.9 g/dL (ref 12.0–15.0)
LYMPHS ABS: 1.6 10*3/uL (ref 0.7–4.0)
Lymphocytes Relative: 23.1 % (ref 12.0–46.0)
MCHC: 33.9 g/dL (ref 30.0–36.0)
MCV: 88.7 fl (ref 78.0–100.0)
MONO ABS: 0.4 10*3/uL (ref 0.1–1.0)
MONOS PCT: 5.5 % (ref 3.0–12.0)
NEUTROS ABS: 4.7 10*3/uL (ref 1.4–7.7)
NEUTROS PCT: 68.6 % (ref 43.0–77.0)
PLATELETS: 222 10*3/uL (ref 150.0–400.0)
RBC: 4.29 Mil/uL (ref 3.87–5.11)
RDW: 12.8 % (ref 11.5–15.5)
WBC: 6.9 10*3/uL (ref 4.0–10.5)

## 2017-06-29 LAB — BASIC METABOLIC PANEL
BUN: 21 mg/dL (ref 6–23)
CO2: 30 meq/L (ref 19–32)
Calcium: 8.9 mg/dL (ref 8.4–10.5)
Chloride: 107 mEq/L (ref 96–112)
Creatinine, Ser: 0.96 mg/dL (ref 0.40–1.20)
GFR: 60.86 mL/min (ref >60.00)
GLUCOSE: 102 mg/dL — AB (ref 70–99)
POTASSIUM: 4.2 meq/L (ref 3.5–5.1)
SODIUM: 142 meq/L (ref 135–145)

## 2017-06-29 LAB — HEPATIC FUNCTION PANEL
ALBUMIN: 3.9 g/dL (ref 3.5–5.2)
ALT: 17 U/L (ref 0–35)
AST: 17 U/L (ref 0–37)
Alkaline Phosphatase: 61 U/L (ref 39–117)
BILIRUBIN TOTAL: 0.6 mg/dL (ref 0.2–1.2)
Bilirubin, Direct: 0.1 mg/dL (ref 0.0–0.3)
Total Protein: 6.5 g/dL (ref 6.0–8.3)

## 2017-06-29 LAB — LIPID PANEL
CHOLESTEROL: 124 mg/dL (ref 0–200)
HDL: 54.9 mg/dL (ref >39.00)
LDL CALC: 50 mg/dL (ref 0–99)
NonHDL: 69.53
TRIGLYCERIDES: 96 mg/dL (ref 0.0–149.0)
Total CHOL/HDL Ratio: 2
VLDL: 19.2 mg/dL (ref 0.0–40.0)

## 2017-06-29 LAB — HEMOGLOBIN A1C: Hgb A1c MFr Bld: 5.9 % (ref 4.6–6.5)

## 2017-06-29 LAB — TSH: TSH: 2.45 u[IU]/mL (ref 0.35–4.50)

## 2017-06-29 NOTE — Progress Notes (Signed)
Subjective:    Patient ID: Julie [name], female    DOB: 18-Jan-1946, 71 y.o.   MRN: [id number]  HPI  Patient presents for yearly follow up exam. She is a pleasant 71 year old female who  has a past medical history of ALLERGIC RHINITIS, Anxiety, Arthritis, ARTHRITIS, TRAUMATIC, ANKLE, Cataract, Depression, Dyslipidemia, EXOGENOUS OBESITY, GERD (gastroesophageal reflux disease), Hemorrhoids, Hyperglycemia, and Melanoma in situ of left lower leg ([place]).  She takes lipitor 20 mg and ASA 81 mg for hyperlipidemia   She takes Cozaar for hypertension   For anxiety and depression she takes Wellbutrin 300 mg XR and ativan PRN.   All immunizations and health maintenance protocols were reviewed with the patient and needed orders were placed. She is UTD on vaccinations  Appropriate screening laboratory values were ordered for the patient including screening of hyperlipidemia, renal function and hepatic function.  Medication reconciliation,  past medical history, social history, problem list and allergies were reviewed in detail with the patient  Goals were established with regard to weight loss, exercise, and  diet in compliance with medications. She does not exercise and continues to eat a lot of prepackaged meals.   Wt Readings from Last 3 Encounters:  06/29/17 203 lb (92.1 kg)  05/28/17 202 lb (91.6 kg)  12/02/16 175 lb (79.4 kg)   She does not want information on a living will.   She is up to date on her colonoscopy and mammogram. She does participate in routine dental and vision screens. She goes to her GYN tomorrow.   Review of Systems  Constitutional: Negative.   HENT: Negative.   Eyes: Negative.   Respiratory: Negative.   Cardiovascular: Negative.   Gastrointestinal: Negative.   Endocrine: Negative.   Genitourinary: Negative.   Musculoskeletal: Positive for arthralgias and back pain.  Skin: Negative.   Allergic/Immunologic: Negative.   Neurological: Negative.     Hematological: Negative.   Psychiatric/Behavioral: Negative.    Past Medical History:  Diagnosis Date  . ALLERGIC RHINITIS   . Anxiety    chronic BZs  . Arthritis   . ARTHRITIS, TRAUMATIC, ANKLE   . Cataract   . Depression   . Dyslipidemia   . EXOGENOUS OBESITY   . GERD (gastroesophageal reflux disease)   . Hemorrhoids   . Hyperglycemia   . Melanoma in situ of left lower leg ([place])     Social History   Socioeconomic History  . Marital status: Married    Spouse name: Not on file  . Number of children: Not on file  . Years of education: Not on file  . Highest education level: Not on file  Social Needs  . Financial resource strain: Not on file  . Food insecurity - worry: Not on file  . Food insecurity - inability: Not on file  . Transportation needs - medical: Not on file  . Transportation needs - non-medical: Not on file  Occupational History  . Not on file  Tobacco Use  . Smoking status: Never Smoker  . Smokeless tobacco: Never Used  Substance and Sexual Activity  . Alcohol use: No  . Drug use: No  . Sexual activity: Not on file  Other Topics Concern  . Not on file  Social History Narrative   Widowed (spouse - Ron of >44years prior to death)   widowed 05/09/2011 - 2 grown supportive dtrs, 5 g-kis (3 are triplets)    Past Surgical History:  Procedure Laterality Date  . ABDOMINAL HYSTERECTOMY  10/1978  .  ANKLE ARTHROSCOPY Right 10/19/12   deorio  . arthroscopy left knee  05/2002, 04/2010  . BREAST SURGERY  11/1988   Benign mass (R) breast  . C. surgery history and scan sheet    . Cataract surgery     (L) eye 09/06/12 & (R) eye 10/11/12  . CHOLECYSTECTOMY  09/2004  . cysto sling  10/2006   3 times  . FRACTURE SURGERY     right ankle  . JOINT REPLACEMENT  06/2010   R ankle  . MASS EXCISION     under right arm  . removal multiple cyst breast left and right    . sling anterior repair  12/1999  . SPINE SURGERY     laminotomy with decompression  . Toe Exostosis   10/08   Bil  . TOTAL ANKLE REPLACEMENT Right     Family History  Problem Relation Age of Onset  . Hyperlipidemia Father   . Coronary artery disease Father        cabg 70yo  . Leukemia Father   . Hypertension Father   . Heart attack Brother 50       stents x 2 (79 & 36 yo)  . Diabetes Brother   . Diabetes Mother   . Hyperlipidemia Mother   . Hypertension Mother   . Colon cancer Neg Hx     Allergies  Allergen Reactions  . Hydrocodone-Acetaminophen     REACTION: hives, itiching  . Latex     Causes redness and itching  . Lisinopril Cough  . Oxycodone-Acetaminophen Itching    REACTION: itching    Current Outpatient Medications on File Prior to Visit  Medication Sig Dispense Refill  . acetaminophen (TYLENOL) 500 MG tablet Take 2 tablets (1,000 mg total) by mouth every 6 (six) hours as needed. 30 tablet 0  . ALPRAZolam (XANAX) 1 MG tablet Take 1 tablet (1 mg total) 2 (two) times daily as needed by mouth. for anxiety 60 tablet 0  . aspirin 81 MG tablet Take 81 mg by mouth daily.      Marland Kitchen atorvastatin (LIPITOR) 20 MG tablet TAKE 1 TABLET BY MOUTH ONCE DAILY 90 tablet 0  . Biotin 2500 MCG CAPS Take by mouth daily.    Marland Kitchen buPROPion (WELLBUTRIN XL) 300 MG 24 hr tablet take 1 tablet by mouth once daily 90 tablet 1  . Calcium Carbonate-Vitamin D (CALTRATE 600+D) 600-400 MG-UNIT per tablet Take 1 tablet by mouth daily.      Marland Kitchen losartan (COZAAR) 25 MG tablet TAKE 1 TABLET BY MOUTH ONCE DAILY 90 tablet 0  . Multiple Vitamin (MULTIVITAMIN) tablet Take 1 tablet by mouth daily.      . Omega-3 Fatty Acids (FISH OIL) 1000 MG CAPS Take by mouth 2 (two) times daily.    Marland Kitchen RA ALLERGY RELIEF 180 MG tablet take 1 tablet by mouth once daily 30 tablet 5   No current facility-administered medications on file prior to visit.     BP 124/78 (BP Location: Left Arm)   Temp 97.7 F (36.5 C) (Oral)   Ht 5' 7.25" (1.708 m)   Wt 203 lb (92.1 kg)   BMI 31.56 kg/m      Objective:   Physical Exam    Constitutional: She is oriented to person, place, and time. She appears well-developed and well-nourished. No distress.  Obese   HENT:  Head: Normocephalic and atraumatic.  Right Ear: External ear normal.  Left Ear: External ear normal.  Nose: Nose normal.  Mouth/Throat: Oropharynx  is clear and moist. No oropharyngeal exudate.  Eyes: Conjunctivae and EOM are normal. Pupils are equal, round, and reactive to light. Right eye exhibits no discharge. Left eye exhibits no discharge. No scleral icterus.  Neck: Normal range of motion. Neck supple. No JVD present. Carotid bruit is not present. No tracheal deviation present. No thyromegaly present.  Cardiovascular: Normal rate, regular rhythm, normal heart sounds and intact distal pulses. Exam reveals no gallop and no friction rub.  No murmur heard. Pulmonary/Chest: Effort normal and breath sounds normal. No stridor. No respiratory distress. She has no wheezes. She has no rales. She exhibits no tenderness.  Abdominal: Soft. Bowel sounds are normal. She exhibits no distension and no mass. There is no tenderness. There is no rebound and no guarding.  Genitourinary:  Genitourinary Comments: Will be done by GYN   Musculoskeletal: Normal range of motion. She exhibits no edema, tenderness or deformity.  Lymphadenopathy:    She has no cervical adenopathy.  Neurological: She is alert and oriented to person, place, and time. She has normal reflexes. She displays normal reflexes. No cranial nerve deficit. She exhibits normal muscle tone. Coordination normal.  Skin: Skin is warm and dry. No rash noted. She is not diaphoretic. No erythema. No pallor.  Psychiatric: She has a normal mood and affect. Her behavior is normal. Judgment and thought content normal.  Nursing note and vitals reviewed.     Assessment & Plan:  1. Essential hypertension - Well controlled. No change in medications at this time  - Basic metabolic panel - CBC with Differential/Platelet -  Hepatic function panel - Lipid panel - TSH  2. ANXIETY DEPRESSION - This time of the year is hard for her due to husbands death. Continue with medications   3. Dyslipidemia - Consider increasing statin  - Basic metabolic panel - CBC with Differential/Platelet - Hepatic function panel - Lipid panel - TSH  4. Hyperglycemia - Consider Metformin  - Basic metabolic panel - CBC with Differential/Platelet - Hepatic function panel - Lipid panel - TSH - Hemoglobin A1c  5. Obesity (BMI 30-39.9) - Weight is up 27 pounds since May - Encouraged exercise and not eating pre packaged foods.  - Basic metabolic panel - CBC with Differential/Platelet - Hepatic function panel - Lipid panel - TSH - Hemoglobin A1c    Dorothyann Peng, NP

## 2017-06-29 NOTE — Patient Instructions (Addendum)
It was great seeing you today   We will follow up with you regarding your labs   Please work on weight loss, you are up 27 pounds over the last 6 months   Health Maintenance, Female Adopting a healthy lifestyle and getting preventive care can go a long way to promote health and wellness. Talk with your health care provider about what schedule of regular examinations is right for you. This is a good chance for you to check in with your provider about disease prevention and staying healthy. In between checkups, there are plenty of things you can do on your own. Experts have done a lot of research about which lifestyle changes and preventive measures are most likely to keep you healthy. Ask your health care provider for more information. Weight and diet Eat a healthy diet  Be sure to include plenty of vegetables, fruits, low-fat dairy products, and lean protein.  Do not eat a lot of foods high in solid fats, added sugars, or salt.  Get regular exercise. This is one of the most important things you can do for your health. ? Most adults should exercise for at least 150 minutes each week. The exercise should increase your heart rate and make you sweat (moderate-intensity exercise). ? Most adults should also do strengthening exercises at least twice a week. This is in addition to the moderate-intensity exercise.  Maintain a healthy weight  Body mass index (BMI) is a measurement that can be used to identify possible weight problems. It estimates body fat based on height and weight. Your health care provider can help determine your BMI and help you achieve or maintain a healthy weight.  For females 1 years of age and older: ? A BMI below 18.5 is considered underweight. ? A BMI of 18.5 to 24.9 is normal. ? A BMI of 25 to 29.9 is considered overweight. ? A BMI of 30 and above is considered obese.  Watch levels of cholesterol and blood lipids  You should start having your blood tested for lipids  and cholesterol at 71 years of age, then have this test every 5 years.  You may need to have your cholesterol levels checked more often if: ? Your lipid or cholesterol levels are high. ? You are older than 71 years of age. ? You are at high risk for heart disease.  Cancer screening Lung Cancer  Lung cancer screening is recommended for adults 49-68 years old who are at high risk for lung cancer because of a history of smoking.  A yearly low-dose CT scan of the lungs is recommended for people who: ? Currently smoke. ? Have quit within the past 15 years. ? Have at least a 30-pack-year history of smoking. A pack year is smoking an average of one pack of cigarettes a day for 1 year.  Yearly screening should continue until it has been 15 years since you quit.  Yearly screening should stop if you develop a health problem that would prevent you from having lung cancer treatment.  Breast Cancer  Practice breast self-awareness. This means understanding how your breasts normally appear and feel.  It also means doing regular breast self-exams. Let your health care provider know about any changes, no matter how small.  If you are in your 20s or 30s, you should have a clinical breast exam (CBE) by a health care provider every 1-3 years as part of a regular health exam.  If you are 68 or older, have a CBE every  year. Also consider having a breast X-ray (mammogram) every year.  If you have a family history of breast cancer, talk to your health care provider about genetic screening.  If you are at high risk for breast cancer, talk to your health care provider about having an MRI and a mammogram every year.  Breast cancer gene (BRCA) assessment is recommended for women who have family members with BRCA-related cancers. BRCA-related cancers include: ? Breast. ? Ovarian. ? Tubal. ? Peritoneal cancers.  Results of the assessment will determine the need for genetic counseling and BRCA1 and BRCA2  testing.  Cervical Cancer Your health care provider may recommend that you be screened regularly for cancer of the pelvic organs (ovaries, uterus, and vagina). This screening involves a pelvic examination, including checking for microscopic changes to the surface of your cervix (Pap test). You may be encouraged to have this screening done every 3 years, beginning at age 26.  For women ages 68-65, health care providers may recommend pelvic exams and Pap testing every 3 years, or they may recommend the Pap and pelvic exam, combined with testing for human papilloma virus (HPV), every 5 years. Some types of HPV increase your risk of cervical cancer. Testing for HPV may also be done on women of any age with unclear Pap test results.  Other health care providers may not recommend any screening for nonpregnant women who are considered low risk for pelvic cancer and who do not have symptoms. Ask your health care provider if a screening pelvic exam is right for you.  If you have had past treatment for cervical cancer or a condition that could lead to cancer, you need Pap tests and screening for cancer for at least 20 years after your treatment. If Pap tests have been discontinued, your risk factors (such as having a new sexual partner) need to be reassessed to determine if screening should resume. Some women have medical problems that increase the chance of getting cervical cancer. In these cases, your health care provider may recommend more frequent screening and Pap tests.  Colorectal Cancer  This type of cancer can be detected and often prevented.  Routine colorectal cancer screening usually begins at 71 years of age and continues through 71 years of age.  Your health care provider may recommend screening at an earlier age if you have risk factors for colon cancer.  Your health care provider may also recommend using home test kits to check for hidden blood in the stool.  A small camera at the end of a  tube can be used to examine your colon directly (sigmoidoscopy or colonoscopy). This is done to check for the earliest forms of colorectal cancer.  Routine screening usually begins at age 13.  Direct examination of the colon should be repeated every 5-10 years through 71 years of age. However, you may need to be screened more often if early forms of precancerous polyps or small growths are found.  Skin Cancer  Check your skin from head to toe regularly.  Tell your health care provider about any new moles or changes in moles, especially if there is a change in a mole's shape or color.  Also tell your health care provider if you have a mole that is larger than the size of a pencil eraser.  Always use sunscreen. Apply sunscreen liberally and repeatedly throughout the day.  Protect yourself by wearing long sleeves, pants, a wide-brimmed hat, and sunglasses whenever you are outside.  Heart disease, diabetes, and  high blood pressure  High blood pressure causes heart disease and increases the risk of stroke. High blood pressure is more likely to develop in: ? People who have blood pressure in the high end of the normal range (130-139/85-89 mm Hg). ? People who are overweight or obese. ? People who are African American.  If you are 37-38 years of age, have your blood pressure checked every 3-5 years. If you are 85 years of age or older, have your blood pressure checked every year. You should have your blood pressure measured twice-once when you are at a hospital or clinic, and once when you are not at a hospital or clinic. Record the average of the two measurements. To check your blood pressure when you are not at a hospital or clinic, you can use: ? An automated blood pressure machine at a pharmacy. ? A home blood pressure monitor.  If you are between 78 years and 20 years old, ask your health care provider if you should take aspirin to prevent strokes.  Have regular diabetes screenings. This  involves taking a blood sample to check your fasting blood sugar level. ? If you are at a normal weight and have a low risk for diabetes, have this test once every three years after 71 years of age. ? If you are overweight and have a high risk for diabetes, consider being tested at a younger age or more often. Preventing infection Hepatitis B  If you have a higher risk for hepatitis B, you should be screened for this virus. You are considered at high risk for hepatitis B if: ? You were born in a country where hepatitis B is common. Ask your health care provider which countries are considered high risk. ? Your parents were born in a high-risk country, and you have not been immunized against hepatitis B (hepatitis B vaccine). ? You have HIV or AIDS. ? You use needles to inject street drugs. ? You live with someone who has hepatitis B. ? You have had sex with someone who has hepatitis B. ? You get hemodialysis treatment. ? You take certain medicines for conditions, including cancer, organ transplantation, and autoimmune conditions.  Hepatitis C  Blood testing is recommended for: ? Everyone born from [birth year] through 1965. ? Anyone with known risk factors for hepatitis C.  Sexually transmitted infections (STIs)  You should be screened for sexually transmitted infections (STIs) including gonorrhea and chlamydia if: ? You are sexually active and are younger than 71 years of age. ? You are older than 71 years of age and your health care provider tells you that you are at risk for this type of infection. ? Your sexual activity has changed since you were last screened and you are at an increased risk for chlamydia or gonorrhea. Ask your health care provider if you are at risk.  If you do not have HIV, but are at risk, it may be recommended that you take a prescription medicine daily to prevent HIV infection. This is called pre-exposure prophylaxis (PrEP). You are considered at risk if: ? You are  sexually active and do not regularly use condoms or know the HIV status of your partner(s). ? You take drugs by injection. ? You are sexually active with a partner who has HIV.  Talk with your health care provider about whether you are at high risk of being infected with HIV. If you choose to begin PrEP, you should first be tested for HIV. You should then be  tested every 3 months for as long as you are taking PrEP. Pregnancy  If you are premenopausal and you may become pregnant, ask your health care provider about preconception counseling.  If you may become pregnant, take 400 to 800 micrograms (mcg) of folic acid every day.  If you want to prevent pregnancy, talk to your health care provider about birth control (contraception). Osteoporosis and menopause  Osteoporosis is a disease in which the bones lose minerals and strength with aging. This can result in serious bone fractures. Your risk for osteoporosis can be identified using a bone density scan.  If you are 47 years of age or older, or if you are at risk for osteoporosis and fractures, ask your health care provider if you should be screened.  Ask your health care provider whether you should take a calcium or vitamin D supplement to lower your risk for osteoporosis.  Menopause may have certain physical symptoms and risks.  Hormone replacement therapy may reduce some of these symptoms and risks. Talk to your health care provider about whether hormone replacement therapy is right for you. Follow these instructions at home:  Schedule regular health, dental, and eye exams.  Stay current with your immunizations.  Do not use any tobacco products including cigarettes, chewing tobacco, or electronic cigarettes.  If you are pregnant, do not drink alcohol.  If you are breastfeeding, limit how much and how often you drink alcohol.  Limit alcohol intake to no more than 1 drink per day for nonpregnant women. One drink equals 12 ounces of  beer, 5 ounces of wine, or 1 ounces of hard liquor.  Do not use street drugs.  Do not share needles.  Ask your health care provider for help if you need support or information about quitting drugs.  Tell your health care provider if you often feel depressed.  Tell your health care provider if you have ever been abused or do not feel safe at home. This information is not intended to replace advice given to you by your health care provider. Make sure you discuss any questions you have with your health care provider. Document Released: 01/13/2011 Document Revised: 12/06/2015 Document Reviewed: 04/03/2015 Elsevier Interactive Patient Education  [employer].

## 2017-06-30 ENCOUNTER — Encounter: Payer: Self-pay | Admitting: Family Medicine

## 2017-07-15 ENCOUNTER — Other Ambulatory Visit: Payer: Self-pay | Admitting: Adult Health

## 2017-07-15 DIAGNOSIS — I10 Essential (primary) hypertension: Secondary | ICD-10-CM

## 2017-07-15 DIAGNOSIS — M25871 Other specified joint disorders, right ankle and foot: Secondary | ICD-10-CM | POA: Diagnosis not present

## 2017-07-15 DIAGNOSIS — M25571 Pain in right ankle and joints of right foot: Secondary | ICD-10-CM | POA: Diagnosis not present

## 2017-07-16 NOTE — Telephone Encounter (Signed)
Called to the pharmacy and left on machine. 

## 2017-07-23 ENCOUNTER — Other Ambulatory Visit: Payer: Self-pay | Admitting: Adult Health

## 2017-07-23 DIAGNOSIS — F329 Major depressive disorder, single episode, unspecified: Secondary | ICD-10-CM

## 2017-07-23 DIAGNOSIS — F419 Anxiety disorder, unspecified: Principal | ICD-10-CM

## 2017-07-28 ENCOUNTER — Telehealth: Payer: Self-pay | Admitting: Adult Health

## 2017-07-28 NOTE — Telephone Encounter (Signed)
Duplicate request.    See refill request.

## 2017-07-28 NOTE — Telephone Encounter (Signed)
Ok to refill for 3 months.  

## 2017-07-28 NOTE — Telephone Encounter (Signed)
Routed back to provider  LR: 05/28/17 #60 0 RF OV: 06/29/17

## 2017-07-28 NOTE — Telephone Encounter (Signed)
Copied from [place] [phone] [phone]. Topic: Quick Communication - Rx Refill/Question >> Jul 28, 2017  9:06 AM Corie Chiquito, NT wrote: Medication: Alprazolam   Has the patient contacted their pharmacy? Yes   Patient stated that she has called her pharmacy but they still don't have her refill was told they were waiting on [place] to approve the refill. Patient stated that she only has one pill left for tonight  Preferred Pharmacy [facility] [address] [phone]   Agent: Please be advised that RX refills may take up to 3 business days. We ask that you follow-up with your pharmacy.

## 2017-07-28 NOTE — Telephone Encounter (Signed)
Called to the pharmacy and left on machine. 

## 2017-07-28 NOTE — Telephone Encounter (Signed)
Last filled 05/29/17 for #60 Please advise

## 2017-07-29 NOTE — Telephone Encounter (Signed)
I spoke with pt and pharmacy, script is ready.

## 2017-07-29 NOTE — Telephone Encounter (Signed)
Patient took her last pill last night, and she has been waiting since 07/24/17 when the pharmacy said they sent it over. She stated she really needs her medication.

## 2017-09-10 ENCOUNTER — Other Ambulatory Visit: Payer: Self-pay | Admitting: Adult Health

## 2017-09-10 DIAGNOSIS — E785 Hyperlipidemia, unspecified: Secondary | ICD-10-CM

## 2017-09-10 DIAGNOSIS — F419 Anxiety disorder, unspecified: Principal | ICD-10-CM

## 2017-09-10 DIAGNOSIS — F329 Major depressive disorder, single episode, unspecified: Secondary | ICD-10-CM

## 2017-09-10 DIAGNOSIS — F32A Depression, unspecified: Secondary | ICD-10-CM

## 2017-09-24 DIAGNOSIS — H57813 Brow ptosis, bilateral: Secondary | ICD-10-CM | POA: Diagnosis not present

## 2017-09-24 DIAGNOSIS — H02831 Dermatochalasis of right upper eyelid: Secondary | ICD-10-CM | POA: Diagnosis not present

## 2017-09-24 DIAGNOSIS — H02834 Dermatochalasis of left upper eyelid: Secondary | ICD-10-CM | POA: Diagnosis not present

## 2017-09-24 DIAGNOSIS — H02423 Myogenic ptosis of bilateral eyelids: Secondary | ICD-10-CM | POA: Diagnosis not present

## 2017-10-22 ENCOUNTER — Telehealth: Payer: Self-pay | Admitting: Adult Health

## 2017-10-22 NOTE — Telephone Encounter (Signed)
Copied from [place] [phone]. Topic: Quick Communication - See Telephone Encounter >> Oct 22, 2017 12:07 PM Robina Ade, Helene Kelp D wrote: CRM for notification. See Telephone encounter for: 10/22/17. Patient would like to talk to [facility] or his CMA about her medication losartan (COZAAR) 25 MG tablet. She said its making her feel tired. She fall asleep quick  since she been on the medication. She is also gaining weight. Please call patient back, thanks.

## 2017-10-23 NOTE — Telephone Encounter (Signed)
Pt scheduled for 10/27/17 @ 4:15.  No further action required.

## 2017-10-23 NOTE — Telephone Encounter (Signed)
She has been on this medication for some time now. It should not just suddenly make her tired and gain weight. Please have her come in for an appointment

## 2017-10-27 ENCOUNTER — Encounter: Payer: Self-pay | Admitting: Adult Health

## 2017-10-27 ENCOUNTER — Ambulatory Visit (INDEPENDENT_AMBULATORY_CARE_PROVIDER_SITE_OTHER): Payer: Medicare Other | Admitting: Adult Health

## 2017-10-27 VITALS — BP 128/92 | Temp 97.6°F | Wt 203.0 lb

## 2017-10-27 DIAGNOSIS — R635 Abnormal weight gain: Secondary | ICD-10-CM

## 2017-10-27 DIAGNOSIS — E559 Vitamin D deficiency, unspecified: Secondary | ICD-10-CM

## 2017-10-27 DIAGNOSIS — R5383 Other fatigue: Secondary | ICD-10-CM

## 2017-10-27 NOTE — Progress Notes (Signed)
Subjective:    Patient ID: Julie [name], female    DOB: 30-Jun-1946, 72 y.o.   MRN: [id number]  HPI 72 year old female who  has a past medical history of ALLERGIC RHINITIS, Anxiety, Arthritis, ARTHRITIS, TRAUMATIC, ANKLE, Cataract, Depression, Dyslipidemia, EXOGENOUS OBESITY, GERD (gastroesophageal reflux disease), Hemorrhoids, Hyperglycemia, and Melanoma in situ of left lower leg ([place]).   She presents to the office today for the complaint of fatigue and weight gain.  Reports that fatigue has been more apparent over the last month or two.  Weight gain has been happening progressively since last July.  She reports that she thinks she sleeps approximately 8 hours but often feels as though she wakes up not feeling rested.  She does snore.  Denies any episodes of waking up in the middle the night gasping for breath.   She does not exercise on a routine basis and does not eat healthy.  She does not cook at home and often eats prepackaged processed meals.  She does report that she is recently started using her stationary bike at home, but is doing this irregularly and for 15 minutes at a time.  Have a history of depression is currently managed with Wellbutrin 300 mg and Xanax gram twice daily as needed.  Does report some episodic episodes of depression, but this is been a long-standing issue.  She does not want to change medications at this point in time.  She denies any chest pain or shortness of breath Review of Systems See HPI   Past Medical History:  Diagnosis Date  . ALLERGIC RHINITIS   . Anxiety    chronic BZs  . Arthritis   . ARTHRITIS, TRAUMATIC, ANKLE   . Cataract   . Depression   . Dyslipidemia   . EXOGENOUS OBESITY   . GERD (gastroesophageal reflux disease)   . Hemorrhoids   . Hyperglycemia   . Melanoma in situ of left lower leg ([place])     Social History   Socioeconomic History  . Marital status: Married    Spouse name: Not on file  . Number of children: Not on file    . Years of education: Not on file  . Highest education level: Not on file  Occupational History  . Not on file  Social Needs  . Financial resource strain: Not on file  . Food insecurity:    Worry: Not on file    Inability: Not on file  . Transportation needs:    Medical: Not on file    Non-medical: Not on file  Tobacco Use  . Smoking status: Never Smoker  . Smokeless tobacco: Never Used  Substance and Sexual Activity  . Alcohol use: No  . Drug use: No  . Sexual activity: Not on file  Lifestyle  . Physical activity:    Days per week: Not on file    Minutes per session: Not on file  . Stress: Not on file  Relationships  . Social connections:    Talks on phone: Not on file    Gets together: Not on file    Attends religious service: Not on file    Active member of club or organization: Not on file    Attends meetings of clubs or organizations: Not on file    Relationship status: Not on file  . Intimate partner violence:    Fear of current or ex partner: Not on file    Emotionally abused: Not on file    Physically abused:  Not on file    Forced sexual activity: Not on file  Other Topics Concern  . Not on file  Social History Narrative   Widowed (spouse - Ron of >44years prior to death)   widowed 2011-04-13 - 2 grown supportive dtrs, 5 g-kis (3 are triplets)    Past Surgical History:  Procedure Laterality Date  . ABDOMINAL HYSTERECTOMY  10/1978  . ANKLE ARTHROSCOPY Right 10/19/12   deorio  . arthroscopy left knee  05/2002, 04/2010  . BREAST SURGERY  11/1988   Benign mass (R) breast  . C. surgery history and scan sheet    . Cataract surgery     (L) eye 09/06/12 & (R) eye 10/11/12  . CHOLECYSTECTOMY  09/2004  . cysto sling  10/2006   3 times  . FRACTURE SURGERY     right ankle  . JOINT REPLACEMENT  06/2010   R ankle  . MASS EXCISION     under right arm  . removal multiple cyst breast left and right    . sling anterior repair  12/1999  . SPINE SURGERY     laminotomy  with decompression  . Toe Exostosis  10/08   Bil  . TOTAL ANKLE REPLACEMENT Right     Family History  Problem Relation Age of Onset  . Hyperlipidemia Father   . Coronary artery disease Father        cabg 70yo  . Leukemia Father   . Hypertension Father   . Heart attack Brother 9       stents x 2 (39 & 57 yo)  . Diabetes Brother   . Diabetes Mother   . Hyperlipidemia Mother   . Hypertension Mother   . Colon cancer Neg Hx     Allergies  Allergen Reactions  . Hydrocodone-Acetaminophen     REACTION: hives, itiching  . Latex     Causes redness and itching  . Lisinopril Cough  . Oxycodone-Acetaminophen Itching    REACTION: itching    Current Outpatient Medications on File Prior to Visit  Medication Sig Dispense Refill  . acetaminophen (TYLENOL) 500 MG tablet Take 2 tablets (1,000 mg total) by mouth every 6 (six) hours as needed. 30 tablet 0  . ALPRAZolam (XANAX) 1 MG tablet TAKE 1 TABLET BY MOUTH TWICE DAILY AS NEEDED FOR ANXIETY 60 tablet 2  . aspirin 81 MG tablet Take 81 mg by mouth daily.      Marland Kitchen atorvastatin (LIPITOR) 20 MG tablet TAKE 1 TABLET BY MOUTH ONCE DAILY 90 tablet 1  . Biotin 2500 MCG CAPS Take by mouth daily.    Marland Kitchen buPROPion (WELLBUTRIN XL) 300 MG 24 hr tablet TAKE 1 TABLET BY MOUTH ONCE DAILY 90 tablet 1  . Calcium Carbonate-Vitamin D (CALTRATE 600+D) 600-400 MG-UNIT per tablet Take 1 tablet by mouth daily.      Marland Kitchen losartan (COZAAR) 25 MG tablet TAKE 1 TABLET BY MOUTH ONCE DAILY 90 tablet 3  . Multiple Vitamin (MULTIVITAMIN) tablet Take 1 tablet by mouth daily.      . Omega-3 Fatty Acids (FISH OIL) 1000 MG CAPS Take by mouth 2 (two) times daily.    Marland Kitchen RA ALLERGY RELIEF 180 MG tablet take 1 tablet by mouth once daily 30 tablet 5   No current facility-administered medications on file prior to visit.     BP (!) 128/92   Temp 97.6 F (36.4 C) (Oral)   Wt 203 lb (92.1 kg)   BMI 31.56 kg/m  Objective:   Physical Exam  Constitutional: She is oriented  to person, place, and time. She appears well-developed and well-nourished. No distress.  Neck: Normal range of motion. Neck supple. No thyromegaly present.  Cardiovascular: Normal rate, regular rhythm, normal heart sounds and intact distal pulses. Exam reveals no gallop and no friction rub.  No murmur heard. Pulmonary/Chest: Effort normal and breath sounds normal. No respiratory distress. She has no wheezes. She has no rales. She exhibits no tenderness.  Musculoskeletal: Normal range of motion. She exhibits no edema, tenderness or deformity.  Lymphadenopathy:    She has no cervical adenopathy.  Neurological: She is alert and oriented to person, place, and time. She has normal reflexes.  Skin: Skin is warm and dry. No rash noted. She is not diaphoretic. No erythema. No pallor.  Psychiatric: She has a normal mood and affect. Her behavior is normal. Judgment and thought content normal.  Nursing note and vitals reviewed.      Assessment & Plan:  1. Fatigue, unspecified type -Probably multifactorial.  I do not think medications are causing these issues.  Will check labs.  Refer to pulmonary for evaluation of sleep apnea.  Encouraged her to make lifestyle modifications with diet and her size, as I believe this is the causative factors of weight gain and fatigue. - CBC with Differential/Platelet - TSH - Vitamin D, [id number] - Basic metabolic panel; Future - Ambulatory referral to Pulmonology  2. Vitamin D deficiency  - Vitamin D, 25-hydroxy  3. Weight gain  - CBC with Differential/Platelet - TSH - Vitamin D, [id number] - Basic metabolic panel; Future - Ambulatory referral to Pulmonology   Dorothyann Peng, NP

## 2017-10-28 LAB — CBC WITH DIFFERENTIAL/PLATELET
Basophils Absolute: 0.1 10*3/uL (ref 0.0–0.1)
Basophils Relative: 0.6 % (ref 0.0–3.0)
EOS PCT: 2.8 % (ref 0.0–5.0)
Eosinophils Absolute: 0.2 10*3/uL (ref 0.0–0.7)
HCT: 41.1 % (ref 36.0–46.0)
Hemoglobin: 13.8 g/dL (ref 12.0–15.0)
LYMPHS ABS: 2.2 10*3/uL (ref 0.7–4.0)
Lymphocytes Relative: 25.4 % (ref 12.0–46.0)
MCHC: 33.7 g/dL (ref 30.0–36.0)
MCV: 88.8 fl (ref 78.0–100.0)
MONO ABS: 0.5 10*3/uL (ref 0.1–1.0)
Monocytes Relative: 6 % (ref 3.0–12.0)
NEUTROS PCT: 65.2 % (ref 43.0–77.0)
Neutro Abs: 5.6 10*3/uL (ref 1.4–7.7)
PLATELETS: 242 10*3/uL (ref 150.0–400.0)
RBC: 4.63 Mil/uL (ref 3.87–5.11)
RDW: 12.9 % (ref 11.5–15.5)
WBC: 8.6 10*3/uL (ref 4.0–10.5)

## 2017-10-28 LAB — VITAMIN D 25 HYDROXY (VIT D DEFICIENCY, FRACTURES): VITD: 34.73 ng/mL (ref 30.00–100.00)

## 2017-10-28 LAB — TSH: TSH: 0.95 u[IU]/mL (ref 0.35–4.50)

## 2017-11-20 ENCOUNTER — Other Ambulatory Visit: Payer: Self-pay | Admitting: Adult Health

## 2017-11-20 DIAGNOSIS — F419 Anxiety disorder, unspecified: Principal | ICD-10-CM

## 2017-11-20 DIAGNOSIS — F329 Major depressive disorder, single episode, unspecified: Secondary | ICD-10-CM

## 2017-12-16 ENCOUNTER — Encounter: Payer: Self-pay | Admitting: Pulmonary Disease

## 2017-12-16 ENCOUNTER — Ambulatory Visit (INDEPENDENT_AMBULATORY_CARE_PROVIDER_SITE_OTHER): Payer: Medicare Other | Admitting: Pulmonary Disease

## 2017-12-16 DIAGNOSIS — G4733 Obstructive sleep apnea (adult) (pediatric): Secondary | ICD-10-CM | POA: Insufficient documentation

## 2017-12-16 DIAGNOSIS — G4700 Insomnia, unspecified: Secondary | ICD-10-CM | POA: Insufficient documentation

## 2017-12-16 DIAGNOSIS — F5104 Psychophysiologic insomnia: Secondary | ICD-10-CM | POA: Diagnosis not present

## 2017-12-16 NOTE — Assessment & Plan Note (Signed)
Given excessive daytime somnolence, narrow pharyngeal exam,  obstructive sleep apnea is very likely & an overnight polysomnogram will be scheduled as a home study. The pathophysiology of obstructive sleep apnea , it's cardiovascular consequences & modes of treatment including CPAP were discused with the patient in detail & they evidenced understanding.  Pretest probability is low

## 2017-12-16 NOTE — Assessment & Plan Note (Signed)
Simple suggestions to improve her sleep hygiene would  Include-  -Light exposure for 30 minutes daily morning - light exercise -avoid caffeinated beverages - no more than 20 mins staying awake in bed, if not asleep, get out of bed & reading or light music - No TV or computer games at bedtime.  If all else fails, try to treat for comorbid depression with depression such as trazodone. May also have to wean off Xanax

## 2017-12-16 NOTE — Patient Instructions (Signed)
Home sleep study will be scheduled. Simple suggestions to improve her sleep hygiene would  Include-  -Light exposure for 30 minutes daily morning - light exercise -avoid caffeinated beverages - no more than 20 mins staying awake in bed, if not asleep, get out of bed & reading or light music - No TV or computer games at bedtime.

## 2017-12-16 NOTE — Progress Notes (Signed)
Subjective:    Patient ID: Julie [name], female    DOB: November 03, 1945, 72 y.o.   MRN: [id number]  HPI  72 year old woman presents for evaluation of sleep disordered breathing. She reports that she has always been a poor sleeper but this has worsened since her husband suddenly passed away 7 years ago.  She reports sadness of mood due to his untimely demise which has persisted and decreased social functioning.  She has tried counseling for a while but blames the doctor who operated on him for his death.  She reports that she is unable to sleep through the night and has several nocturnal awakenings and non-refreshing sleep.  She also reports a long time to fall asleep.  Sleep-related symptoms have been ongoing for many years .  She has tried Ambien in the past but this caused sleep-related behaviors such as calling people without realizing it and she stopped.  She has also been on Xanax for many years for anxiety and decreasing dose caused withdrawal symptoms.  Epworth sleepiness score is 3 and she denies excessive daytime somnolence but does report fatigue and tiredness. Bedtime is between 10 PM to midnight, TV stays on while she is trying to follow sleep, sleep latency can be 1 to 2 hours sometimes in spite of taking her Xanax and she wonders if she has gotten used to this medication.  She sleeps on her side with one pillow, reports 5-6 nocturnal awakenings including nocturia and is finally out of bed by 9 AM feeling tired without dryness of mouth or headaches. She has never taken naps. She has gained about 20 pounds in the last 2 years.  She denies excessive caffeinated beverages or chocolate intake after 3 PM  There is no history suggestive of cataplexy, sleep paralysis or parasomnias    Past Medical History:  Diagnosis Date  . ALLERGIC RHINITIS   . Anxiety    chronic BZs  . Arthritis   . ARTHRITIS, TRAUMATIC, ANKLE   . Cataract   . Depression   . Dyslipidemia   . EXOGENOUS  OBESITY   . GERD (gastroesophageal reflux disease)   . Hemorrhoids   . Hyperglycemia   . Melanoma in situ of left lower leg [facility])    Past Surgical History:  Procedure Laterality Date  . ABDOMINAL HYSTERECTOMY  10/1978  . ANKLE ARTHROSCOPY Right 10/19/12   deorio  . arthroscopy left knee  05/2002, 04/2010  . BREAST SURGERY  11/1988   Benign mass (R) breast  . C. surgery history and scan sheet    . Cataract surgery     (L) eye 09/06/12 & (R) eye 10/11/12  . CHOLECYSTECTOMY  09/2004  . cysto sling  10/2006   3 times  . FRACTURE SURGERY     right ankle  . JOINT REPLACEMENT  06/2010   R ankle  . MASS EXCISION     under right arm  . removal multiple cyst breast left and right    . sling anterior repair  12/1999  . SPINE SURGERY     laminotomy with decompression  . Toe Exostosis  10/08   Bil  . TOTAL ANKLE REPLACEMENT Right     Allergies  Allergen Reactions  . Hydrocodone-Acetaminophen     REACTION: hives, itiching  . Latex     Causes redness and itching  . Lisinopril Cough  . Oxycodone-Acetaminophen Itching    REACTION: itching     Social History   Socioeconomic History  . Marital status: Married  Spouse name: Not on file  . Number of children: Not on file  . Years of education: Not on file  . Highest education level: Not on file  Occupational History  . Not on file  Social Needs  . Financial resource strain: Not on file  . Food insecurity:    Worry: Not on file    Inability: Not on file  . Transportation needs:    Medical: Not on file    Non-medical: Not on file  Tobacco Use  . Smoking status: Never Smoker  . Smokeless tobacco: Never Used  Substance and Sexual Activity  . Alcohol use: No  . Drug use: No  . Sexual activity: Not on file  Lifestyle  . Physical activity:    Days per week: Not on file    Minutes per session: Not on file  . Stress: Not on file  Relationships  . Social connections:    Talks on phone: Not on file    Gets together: Not on  file    Attends religious service: Not on file    Active member of club or organization: Not on file    Attends meetings of clubs or organizations: Not on file    Relationship status: Not on file  . Intimate partner violence:    Fear of current or ex partner: Not on file    Emotionally abused: Not on file    Physically abused: Not on file    Forced sexual activity: Not on file  Other Topics Concern  . Not on file  Social History Narrative   Widowed (spouse - Ron of >44years prior to death)   72 year old woman     Family History  Problem Relation Age of Onset  . Hyperlipidemia Father   . Coronary artery disease Father        cabg 72yo  . Leukemia Father   . Hypertension Father   . Heart attack Brother 34       stents x 2 (15 & 60 yo)  . Diabetes Brother   . Diabetes Mother   . Hyperlipidemia Mother   . Hypertension Mother   . Colon cancer Neg Hx      Review of Systems Positive for shortness of breath activity, nonproductive cough, weight gain of 20 pounds, tooth problems, headaches, nasal congestion sneezing, anxiety and depression  Constitutional: negative for anorexia, fevers and sweats  Eyes: negative for irritation, redness and visual disturbance  Ears, nose, mouth, throat, and face: negative for earaches, epistaxis, nasal congestion and sore throat  Respiratory: negative for   sputum and wheezing  Cardiovascular: negative for chest pain,lower extremity edema, orthopnea, palpitations and syncope  Gastrointestinal: negative for abdominal pain, constipation, diarrhea, melena, nausea and vomiting  Genitourinary:negative for dysuria, frequency and hematuria  Hematologic/lymphatic: negative for bleeding, easy bruising and lymphadenopathy  Musculoskeletal:negative for arthralgias, muscle weakness and stiff joints  Neurological: negative for coordination problems, gait problems, headaches and weakness  Endocrine: negative  for diabetic symptoms including polydipsia, polyuria and weight loss     Objective:   Physical Exam  Gen. Pleasant, obese, in no distress ENT - no lesions, no post nasal drip Neck: No JVD, no thyromegaly, no carotid bruits Lungs: no use of accessory muscles, no dullness to percussion, decreased without rales or rhonchi  Cardiovascular: Rhythm regular, heart sounds  normal, no murmurs or gallops, no peripheral edema Musculoskeletal: No deformities, no cyanosis or clubbing , no tremors  Assessment & Plan:

## 2017-12-22 DIAGNOSIS — F329 Major depressive disorder, single episode, unspecified: Secondary | ICD-10-CM | POA: Diagnosis not present

## 2017-12-22 DIAGNOSIS — Z79899 Other long term (current) drug therapy: Secondary | ICD-10-CM | POA: Diagnosis not present

## 2017-12-22 DIAGNOSIS — Z85820 Personal history of malignant melanoma of skin: Secondary | ICD-10-CM | POA: Diagnosis not present

## 2017-12-22 DIAGNOSIS — Z885 Allergy status to narcotic agent status: Secondary | ICD-10-CM | POA: Diagnosis not present

## 2017-12-22 DIAGNOSIS — Z961 Presence of intraocular lens: Secondary | ICD-10-CM | POA: Diagnosis not present

## 2017-12-22 DIAGNOSIS — R0683 Snoring: Secondary | ICD-10-CM | POA: Diagnosis not present

## 2017-12-22 DIAGNOSIS — E785 Hyperlipidemia, unspecified: Secondary | ICD-10-CM | POA: Diagnosis not present

## 2017-12-22 DIAGNOSIS — H02834 Dermatochalasis of left upper eyelid: Secondary | ICD-10-CM | POA: Diagnosis not present

## 2017-12-22 DIAGNOSIS — H02422 Myogenic ptosis of left eyelid: Secondary | ICD-10-CM | POA: Diagnosis not present

## 2017-12-22 DIAGNOSIS — I10 Essential (primary) hypertension: Secondary | ICD-10-CM | POA: Diagnosis not present

## 2017-12-22 DIAGNOSIS — Z888 Allergy status to other drugs, medicaments and biological substances status: Secondary | ICD-10-CM | POA: Diagnosis not present

## 2017-12-22 DIAGNOSIS — E669 Obesity, unspecified: Secondary | ICD-10-CM | POA: Diagnosis not present

## 2017-12-22 DIAGNOSIS — Z6829 Body mass index (BMI) 29.0-29.9, adult: Secondary | ICD-10-CM | POA: Diagnosis not present

## 2017-12-22 DIAGNOSIS — Z9842 Cataract extraction status, left eye: Secondary | ICD-10-CM | POA: Diagnosis not present

## 2017-12-22 DIAGNOSIS — Z9841 Cataract extraction status, right eye: Secondary | ICD-10-CM | POA: Diagnosis not present

## 2017-12-22 DIAGNOSIS — H02831 Dermatochalasis of right upper eyelid: Secondary | ICD-10-CM | POA: Diagnosis not present

## 2017-12-22 DIAGNOSIS — F419 Anxiety disorder, unspecified: Secondary | ICD-10-CM | POA: Diagnosis not present

## 2017-12-22 DIAGNOSIS — Z7982 Long term (current) use of aspirin: Secondary | ICD-10-CM | POA: Diagnosis not present

## 2017-12-22 DIAGNOSIS — Z96669 Presence of unspecified artificial ankle joint: Secondary | ICD-10-CM | POA: Diagnosis not present

## 2018-01-18 DIAGNOSIS — G4733 Obstructive sleep apnea (adult) (pediatric): Secondary | ICD-10-CM | POA: Diagnosis not present

## 2018-01-21 ENCOUNTER — Other Ambulatory Visit: Payer: Self-pay | Admitting: *Deleted

## 2018-01-21 DIAGNOSIS — F5104 Psychophysiologic insomnia: Secondary | ICD-10-CM

## 2018-01-25 ENCOUNTER — Telehealth: Payer: Self-pay | Admitting: Pulmonary Disease

## 2018-01-25 DIAGNOSIS — G4733 Obstructive sleep apnea (adult) (pediatric): Secondary | ICD-10-CM | POA: Diagnosis not present

## 2018-01-25 NOTE — Telephone Encounter (Signed)
Per RA, HST showed mild OSA 5-6 events per hour, only when sleeping in the supine position. Recommends avoiding sleeping in the supine position and weight loss. F/U in 6 months with NP.

## 2018-01-28 NOTE — Telephone Encounter (Signed)
Spoke with patient. She is aware of results.   Nothing else needed at time of call.  

## 2018-02-19 DIAGNOSIS — Z961 Presence of intraocular lens: Secondary | ICD-10-CM | POA: Diagnosis not present

## 2018-02-19 DIAGNOSIS — H04123 Dry eye syndrome of bilateral lacrimal glands: Secondary | ICD-10-CM | POA: Diagnosis not present

## 2018-02-19 DIAGNOSIS — H524 Presbyopia: Secondary | ICD-10-CM | POA: Diagnosis not present

## 2018-02-19 DIAGNOSIS — H43813 Vitreous degeneration, bilateral: Secondary | ICD-10-CM | POA: Diagnosis not present

## 2018-02-19 DIAGNOSIS — H5213 Myopia, bilateral: Secondary | ICD-10-CM | POA: Diagnosis not present

## 2018-02-27 ENCOUNTER — Encounter (HOSPITAL_COMMUNITY): Payer: Self-pay | Admitting: *Deleted

## 2018-02-27 ENCOUNTER — Emergency Department (HOSPITAL_COMMUNITY)
Admission: EM | Admit: 2018-02-27 | Discharge: 2018-02-27 | Disposition: A | Discharge status: Home / Self Care | Payer: Medicare Other | Attending: Emergency Medicine | Admitting: Emergency Medicine

## 2018-02-27 ENCOUNTER — Emergency Department (HOSPITAL_COMMUNITY): Payer: Medicare Other

## 2018-02-27 DIAGNOSIS — K625 Hemorrhage of anus and rectum: Secondary | ICD-10-CM | POA: Insufficient documentation

## 2018-02-27 DIAGNOSIS — Z7982 Long term (current) use of aspirin: Secondary | ICD-10-CM | POA: Insufficient documentation

## 2018-02-27 DIAGNOSIS — Z96661 Presence of right artificial ankle joint: Secondary | ICD-10-CM | POA: Diagnosis not present

## 2018-02-27 DIAGNOSIS — R197 Diarrhea, unspecified: Secondary | ICD-10-CM | POA: Diagnosis not present

## 2018-02-27 DIAGNOSIS — Z91040 Latex allergy status: Secondary | ICD-10-CM | POA: Insufficient documentation

## 2018-02-27 DIAGNOSIS — R1032 Left lower quadrant pain: Secondary | ICD-10-CM | POA: Diagnosis present

## 2018-02-27 DIAGNOSIS — Z79899 Other long term (current) drug therapy: Secondary | ICD-10-CM | POA: Diagnosis not present

## 2018-02-27 DIAGNOSIS — R102 Pelvic and perineal pain: Secondary | ICD-10-CM | POA: Diagnosis not present

## 2018-02-27 DIAGNOSIS — K529 Noninfective gastroenteritis and colitis, unspecified: Secondary | ICD-10-CM | POA: Insufficient documentation

## 2018-02-27 DIAGNOSIS — I10 Essential (primary) hypertension: Secondary | ICD-10-CM | POA: Insufficient documentation

## 2018-02-27 LAB — COMPREHENSIVE METABOLIC PANEL
ALK PHOS: 66 U/L (ref 38–126)
ALT: 18 U/L (ref 0–44)
ANION GAP: 8 (ref 5–15)
AST: 21 U/L (ref 15–41)
Albumin: 3.9 g/dL (ref 3.5–5.0)
BUN: 11 mg/dL (ref 8–23)
CALCIUM: 9.1 mg/dL (ref 8.9–10.3)
CO2: 28 mmol/L (ref 22–32)
CREATININE: 0.99 mg/dL (ref 0.44–1.00)
Chloride: 106 mmol/L (ref 98–111)
GFR calc Af Amer: >60 mL/min (ref >60)
GFR calc non Af Amer: 56 mL/min — ABNORMAL LOW (ref >60)
Glucose, Bld: 111 mg/dL — ABNORMAL HIGH (ref 70–99)
Potassium: 4 mmol/L (ref 3.5–5.1)
SODIUM: 142 mmol/L (ref 135–145)
Total Bilirubin: 0.8 mg/dL (ref 0.3–1.2)
Total Protein: 7.6 g/dL (ref 6.5–8.1)

## 2018-02-27 LAB — TYPE AND SCREEN
ABO/RH(D): A POS
Antibody Screen: NEGATIVE

## 2018-02-27 LAB — CBC
HCT: 41.9 % (ref 36.0–46.0)
HEMOGLOBIN: 13.8 g/dL (ref 12.0–15.0)
MCH: 29.5 pg (ref 26.0–34.0)
MCHC: 32.9 g/dL (ref 30.0–36.0)
MCV: 89.5 fL (ref 78.0–100.0)
Platelets: 240 10*3/uL (ref 150–400)
RBC: 4.68 MIL/uL (ref 3.87–5.11)
RDW: 12.8 % (ref 11.5–15.5)
WBC: 9.6 10*3/uL (ref 4.0–10.5)

## 2018-02-27 MED ORDER — CIPROFLOXACIN HCL 500 MG PO TABS: 500.0000 mg | ORAL_TABLET | Freq: Two times a day (BID) | ORAL | 0 refills | Status: DC

## 2018-02-27 MED ORDER — IOPAMIDOL (ISOVUE-300) INJECTION 61%
100.0000 mL | Freq: Once | INTRAVENOUS | Status: AC | PRN
  Administered 2018-02-27: 100 mL via INTRAVENOUS

## 2018-02-27 MED ORDER — ONDANSETRON 4 MG PO TBDP: 4.0000 mg | ORAL_TABLET | Freq: Three times a day (TID) | ORAL | 0 refills | Status: DC | PRN

## 2018-02-27 MED ORDER — METRONIDAZOLE 500 MG PO TABS: 500.0000 mg | ORAL_TABLET | Freq: Two times a day (BID) | ORAL | 0 refills | Status: DC

## 2018-02-27 MED ORDER — METRONIDAZOLE 500 MG PO TABS
500.0000 mg | ORAL_TABLET | Freq: Once | ORAL | Status: AC
  Administered 2018-02-27: 500 mg via ORAL
  Filled 2018-02-27: qty 1

## 2018-02-27 MED ORDER — DICYCLOMINE HCL 10 MG/ML IM SOLN
20.0000 mg | Freq: Once | INTRAMUSCULAR | Status: AC
  Administered 2018-02-27: 20 mg via INTRAMUSCULAR
  Filled 2018-02-27: qty 2

## 2018-02-27 MED ORDER — TRAMADOL HCL 50 MG PO TABS: 50.0000 mg | ORAL_TABLET | Freq: Four times a day (QID) | ORAL | 0 refills | Status: DC | PRN

## 2018-02-27 MED ORDER — SODIUM CHLORIDE 0.9 % IV BOLUS
1000.0000 mL | Freq: Once | INTRAVENOUS | Status: AC
  Administered 2018-02-27: 1000 mL via INTRAVENOUS

## 2018-02-27 MED ORDER — CIPROFLOXACIN HCL 500 MG PO TABS
500.0000 mg | ORAL_TABLET | Freq: Once | ORAL | Status: AC
  Administered 2018-02-27: 500 mg via ORAL
  Filled 2018-02-27: qty 1

## 2018-02-27 MED ORDER — IOPAMIDOL (ISOVUE-300) INJECTION 61%
INTRAVENOUS | Status: AC
  Filled 2018-02-27: qty 100

## 2018-02-27 NOTE — Discharge Instructions (Signed)
As discussed, you have been diagnosed with colitis. It is important to take medication as directed, monitor your condition carefully, and do not hesitate to return for concerning changes. Otherwise be sure to follow-up with your gastroenterologist.

## 2018-02-27 NOTE — ED Provider Notes (Signed)
[place] DEPT Provider Note   CSN: [id number] Arrival date & time: 02/27/18  1153     History   Chief Complaint Chief Complaint  Patient presents with  . GI Bleeding  . Abdominal Pain    HPI Julie [name] is a 72 y.o. female.  HPI Patient presents with abdominal pain, nausea, bright red blood per rectum. She was well until yesterday, when after eating at a fast food restaurant, a chicken sandwich, she felt the onset of abdominal cramps. Since that time she has had episodic severe sharp abdominal pain in the lower abdomen. Initially she had diarrhea mixed with bloody, but now, with attempted defecation she has production of bright red blood, no stool. There is associated nausea, but no vomiting. She has not taken any medication for pain relief. Last colonoscopy was in the distant past.  Past Medical History:  Diagnosis Date  . ALLERGIC RHINITIS   . Anxiety    chronic BZs  . Arthritis   . ARTHRITIS, TRAUMATIC, ANKLE   . Cataract   . Depression   . Dyslipidemia   . EXOGENOUS OBESITY   . GERD (gastroesophageal reflux disease)   . Hemorrhoids   . Hyperglycemia   . Melanoma in situ of left lower leg [facility])     Patient Active Problem List   Diagnosis Date Noted  . Insomnia 12/16/2017  . OSA (obstructive sleep apnea) 12/16/2017  . Essential hypertension 03/27/2016  . Hyperglycemia 02/03/2012  . Dyslipidemia 06/30/2011  . ARTHRITIS, TRAUMATIC, ANKLE 01/01/2010  . EXOGENOUS OBESITY 12/26/2008  . ANXIETY DEPRESSION 12/02/2007  . ALLERGIC RHINITIS 04/08/2007  . GERD 04/08/2007    Past Surgical History:  Procedure Laterality Date  . ABDOMINAL HYSTERECTOMY  10/1978  . ANKLE ARTHROSCOPY Right 10/19/12   deorio  . arthroscopy left knee  05/2002, 04/2010  . BREAST SURGERY  11/1988   Benign mass (R) breast  . C. surgery history and scan sheet    . Cataract surgery     (L) eye 09/06/12 & (R) eye 10/11/12  . CHOLECYSTECTOMY  09/2004  .  cysto sling  10/2006   3 times  . FRACTURE SURGERY     right ankle  . JOINT REPLACEMENT  06/2010   R ankle  . MASS EXCISION     under right arm  . removal multiple cyst breast left and right    . sling anterior repair  12/1999  . SPINE SURGERY     laminotomy with decompression  . Toe Exostosis  10/08   Bil  . TOTAL ANKLE REPLACEMENT Right      OB History   None      Home Medications    Prior to Admission medications   Medication Sig Start Date End Date Taking? Authorizing Provider  acetaminophen (TYLENOL) 500 MG tablet Take 2 tablets (1,000 mg total) by mouth every 6 (six) hours as needed. 10/11/16  Yes Pfeiffer, Jeannie Done, MD  ALPRAZolam Duanne Moron) 1 MG tablet TAKE 1 TABLET BY MOUTH TWICE DAILY AS NEEDED FOR ANXIETY 11/20/17  Yes Nafziger, Tommi Rumps, NP  aspirin 81 MG tablet Take 81 mg by mouth daily.     Yes [provider]  atorvastatin (LIPITOR) 20 MG tablet TAKE 1 TABLET BY MOUTH ONCE DAILY 09/15/17  Yes Nafziger, Tommi Rumps, NP  Biotin 2500 MCG CAPS Take by mouth daily.   Yes [provider]  buPROPion (WELLBUTRIN XL) 300 MG 24 hr tablet TAKE 1 TABLET BY MOUTH ONCE DAILY 09/15/17  Yes Nafziger,  Tommi Rumps, NP  Calcium Carbonate-Vitamin D (CALTRATE 600+D) 600-400 MG-UNIT per tablet Take 1 tablet by mouth daily.     Yes [provider]  losartan (COZAAR) 25 MG tablet TAKE 1 TABLET BY MOUTH ONCE DAILY 07/16/17  Yes Nafziger, Tommi Rumps, NP  Multiple Vitamin (MULTIVITAMIN) tablet Take 1 tablet by mouth daily.     Yes [provider]  Omega-3 Fatty Acids (FISH OIL) 1000 MG CAPS Take by mouth 2 (two) times daily.   Yes [provider]  RA ALLERGY RELIEF 180 MG tablet take 1 tablet by mouth once daily 10/27/13  Yes Rowe Clack, MD  vitamin B-12 (CYANOCOBALAMIN) 1000 MCG tablet Take 1,000 mcg by mouth daily.   Yes [provider]  ciprofloxacin (CIPRO) 500 MG tablet Take 1 tablet (500 mg total) by mouth 2 (two) times daily. 02/27/18   Carmin Muskrat, MD    metroNIDAZOLE (FLAGYL) 500 MG tablet Take 1 tablet (500 mg total) by mouth 2 (two) times daily. 02/27/18   Carmin Muskrat, MD  ondansetron (ZOFRAN ODT) 4 MG disintegrating tablet Take 1 tablet (4 mg total) by mouth every 8 (eight) hours as needed for nausea or vomiting. 02/27/18   Carmin Muskrat, MD  traMADol (ULTRAM) 50 MG tablet Take 1 tablet (50 mg total) by mouth every 6 (six) hours as needed. 02/27/18   Carmin Muskrat, MD    Family History Family History  Problem Relation Age of Onset  . Hyperlipidemia Father   . Coronary artery disease Father        cabg 70yo  . Leukemia Father   . Hypertension Father   . Heart attack Brother 45       stents x 2 (32 & 54 yo)  . Diabetes Brother   . Diabetes Mother   . Hyperlipidemia Mother   . Hypertension Mother   . Colon cancer Neg Hx     Social History Social History   Tobacco Use  . Smoking status: Never Smoker  . Smokeless tobacco: Never Used  Substance Use Topics  . Alcohol use: No  . Drug use: No     Allergies   Hydrocodone-acetaminophen; Latex; Lisinopril; and Oxycodone-acetaminophen   Review of Systems Review of Systems  Constitutional:       Per HPI, otherwise negative  HENT:       Per HPI, otherwise negative  Respiratory:       Per HPI, otherwise negative  Cardiovascular:       Per HPI, otherwise negative  Gastrointestinal: Positive for abdominal pain, blood in stool and nausea. Negative for vomiting.  Endocrine:       Negative aside from HPI  Genitourinary:       Neg aside from HPI   Musculoskeletal:       Per HPI, otherwise negative  Skin: Negative.   Neurological: Negative for syncope.     Physical Exam Updated Vital Signs BP (!) 109/58   Pulse 61   Temp 97.9 F (36.6 C) (Oral)   Resp 18   SpO2 100%   Physical Exam  Constitutional: She is oriented to person, place, and time. She appears well-developed and well-nourished. No distress.  HENT:  Head: Normocephalic and atraumatic.  Eyes:  Conjunctivae and EOM are normal.  Cardiovascular: Normal rate and regular rhythm.  Pulmonary/Chest: Effort normal and breath sounds normal. No stridor. No respiratory distress.  Abdominal: She exhibits no distension. There is tenderness in the suprapubic area and left lower quadrant.  Musculoskeletal: She exhibits no edema.  Neurological: She is alert and oriented to person, place, and time. No cranial nerve deficit.  Skin: Skin is warm and dry.  Psychiatric: She has a normal mood and affect.  Nursing note and vitals reviewed.    ED Treatments / Results  Labs (all labs ordered are listed, but only abnormal results are displayed) Labs Reviewed  COMPREHENSIVE METABOLIC PANEL - Abnormal; Notable for the following components:      Result Value   Glucose, Bld 111 (*)    GFR calc non Af Amer 56 (*)    All other components within normal limits  CBC  POC OCCULT BLOOD, ED  TYPE AND SCREEN    EKG None  Radiology Ct Abdomen Pelvis W Contrast  Result Date: 02/27/2018 CLINICAL DATA:  Pelvic pain and bloody diarrhea. EXAM: CT ABDOMEN AND PELVIS WITH CONTRAST TECHNIQUE: Multidetector CT imaging of the abdomen and pelvis was performed using the standard protocol following bolus administration of intravenous contrast. CONTRAST:  158mL ISOVUE-300 IOPAMIDOL (ISOVUE-300) INJECTION 61% COMPARISON:  None. FINDINGS: Lower chest: No acute abnormality. Mild left coronary artery atherosclerotic calcifications. Hepatobiliary: 2.2 cm enhancing lesion in the peripheral right hepatic lobe. Status post cholecystectomy. No biliary dilatation. Pancreas: Unremarkable. No pancreatic ductal dilatation or surrounding inflammatory changes. Spleen: Normal in size without focal abnormality. Adrenals/Urinary Tract: The adrenal glands are unremarkable. Subcentimeter low-density lesions in both kidneys are too small to characterize. Duplicated right renal collecting system. No renal or ureteral calculi. The bladder is  decompressed. Stomach/Bowel: The stomach is within normal limits. Mild circumferential wall thickening with surrounding inflammatory changes involving the distal transverse and proximal descending colon. No obstruction. Normal appendix. Vascular/Lymphatic: Mild aortic atherosclerosis. No enlarged abdominal or pelvic lymph nodes. Reproductive: Status post hysterectomy. No adnexal masses. Other: No free fluid or pneumoperitoneum. Small fat containing umbilical and right inguinal hernias. Musculoskeletal: No acute or significant osseous findings. IMPRESSION: 1. Circumferential wall thickening and inflammatory changes involving the distal transverse and proximal descending colon, consistent with colitis. 2. Indeterminate 2.2 cm enhancing lesion in the peripheral right hepatic lobe. Recommend non-emergent hepatic MRI with and without contrast for further evaluation. 3.  Aortic atherosclerosis (ICD10-I70.0). Electronically Signed   By: Titus Dubin M.D.   On: 02/27/2018 17:06    Procedures Procedures (including critical care time)  Medications Ordered in ED Medications  iopamidol (ISOVUE-300) 61 % injection (has no administration in time range)  ciprofloxacin (CIPRO) tablet 500 mg (has no administration in time range)  metroNIDAZOLE (FLAGYL) tablet 500 mg (has no administration in time range)  sodium chloride 0.9 % bolus 1,000 mL (0 mLs Intravenous Stopped 02/27/18 1705)  dicyclomine (BENTYL) injection 20 mg (20 mg Intramuscular Given 02/27/18 1547)  iopamidol (ISOVUE-300) 61 % injection 100 mL (100 mLs Intravenous Contrast Given 02/27/18 1614)     Initial Impression / Assessment and Plan / ED Course  I have reviewed the triage vital signs and the nursing notes.  Pertinent labs & imaging results that were available during my care of the patient were reviewed by me and considered in my medical decision making (see chart for details).     On repeat exam the patient is awake and alert She still has  some mild discomfort, but remains hemodynamically unremarkable. Discussed all findings with patient and her daughter, including CT evidence for colitis, consistent with the patient's description of lower abdominal pain, bloody stool. We discussed importance of following up with GI, initiation of antibiotics empirically.  On absent evidence for peritonitis, bacteremia, sepsis, patient discharged with  ongoing antibiotics, after mentioned medication.   5:52 PM   Final Clinical Impressions(s) / ED Diagnoses   Final diagnoses:  Colitis, acute    ED Discharge Orders         Ordered    ciprofloxacin (CIPRO) 500 MG tablet  2 times daily     02/27/18 1751    metroNIDAZOLE (FLAGYL) 500 MG tablet  2 times daily     02/27/18 1751    traMADol (ULTRAM) 50 MG tablet  Every 6 hours PRN     02/27/18 1751    ondansetron (ZOFRAN ODT) 4 MG disintegrating tablet  Every 8 hours PRN     02/27/18 1751           Carmin Muskrat, MD 02/27/18 1756

## 2018-02-27 NOTE — ED Triage Notes (Signed)
Pt states she has bright red bloody bowel movement since 7PM yesterday. Pt states she felt like she had food poisoning after eating lunch yesterday. Pt has lower abdominal pain.

## 2018-03-09 ENCOUNTER — Other Ambulatory Visit: Payer: Self-pay | Admitting: Adult Health

## 2018-03-09 DIAGNOSIS — E785 Hyperlipidemia, unspecified: Secondary | ICD-10-CM

## 2018-03-09 NOTE — Telephone Encounter (Signed)
Sent to the pharmacy by e-scribe. 

## 2018-03-10 ENCOUNTER — Other Ambulatory Visit: Payer: Self-pay | Admitting: Adult Health

## 2018-03-10 DIAGNOSIS — F329 Major depressive disorder, single episode, unspecified: Secondary | ICD-10-CM

## 2018-03-10 DIAGNOSIS — F419 Anxiety disorder, unspecified: Principal | ICD-10-CM

## 2018-03-10 DIAGNOSIS — F32A Depression, unspecified: Secondary | ICD-10-CM

## 2018-03-10 NOTE — Telephone Encounter (Signed)
Sent to the pharmacy by e-scribe.  Due 12/19 for cpx.

## 2018-03-18 ENCOUNTER — Other Ambulatory Visit: Payer: Self-pay | Admitting: Physician Assistant

## 2018-03-18 ENCOUNTER — Ambulatory Visit (INDEPENDENT_AMBULATORY_CARE_PROVIDER_SITE_OTHER): Payer: Medicare Other | Admitting: Physician Assistant

## 2018-03-18 ENCOUNTER — Encounter: Payer: Self-pay | Admitting: Physician Assistant

## 2018-03-18 VITALS — BP 110/64 | HR 84 | Temp 98.0°F | Ht 67.75 in | Wt 200.0 lb

## 2018-03-18 DIAGNOSIS — R109 Unspecified abdominal pain: Secondary | ICD-10-CM

## 2018-03-18 DIAGNOSIS — R932 Abnormal findings on diagnostic imaging of liver and biliary tract: Secondary | ICD-10-CM | POA: Diagnosis not present

## 2018-03-18 DIAGNOSIS — K529 Noninfective gastroenteritis and colitis, unspecified: Secondary | ICD-10-CM

## 2018-03-18 MED ORDER — HYOSCYAMINE SULFATE SL 0.125 MG SL SUBL: SUBLINGUAL_TABLET | SUBLINGUAL | 1 refills | Status: DC

## 2018-03-18 NOTE — Progress Notes (Signed)
Subjective:    Patient ID: Julie [name], female    DOB: Oct 16, 1945, 72 y.o.   MRN: [id number]  HPI; Yesli is a very nice 72 year old white female known to Dr. Silverio Decamp who comes in today for follow-up after a recent ER visit on 02/27/2018. Patient says that she had eaten lunch with a chicken sandwich from [facility] on 02/26/2018 and about an hour later developed severe sharp crampy lower abdominal pain eventually followed by diarrhea which was bloody.  She says her pains were progressively severe and "worse than labor".  She had multiple episodes of bloody diarrheal stool through the remainder of the day and into the following morning.  She said initially at onset she also had rather diffuse diaphoresis, she never developed any significant fever or chills, no nausea or vomiting.  When her symptoms were persisting the following morning she went to the ER.  Labs on 02/27/2018 WBC of 9.6, hemoglobin 13.8, hematocrit 41.9 chemistries unremarkable. CT of the abdomen and pelvis was done which showed circumferential wall thickening and inflammatory changes involving the distal transverse and proximal descending colon this was felt consistent with an acute colitis.  She also was noted to have a 2.2 cm enhancing lesion in the right hepatic lobe and nonemergent MRI was recommended. Patient was treated with a course of Cipro and Flagyl which she has completed. She says she continued to have lower abdominal cramping and diarrhea for at least a week then gradually tapered off.  She had stopped seeing any blood but then 2 days ago saw small amount of blood with a loose stool.  She is eating better at this point but having to be very careful with what she eats.  She never had any nausea or vomiting.  She has a little bit of residual soreness in her lower abdomen continues to have a couple of looser bowel movements per day. She says she had not started any new medications, taken any recent antibiotics, no cold  medications Ronney Asters She is concerned as she is supposed to go out of town in a couple of days for a week.  Last colonoscopy was done December 2016 with removal of one 3 to 5 mm polyp which was hyperplastic otherwise negative exam.    Review of Systems;Pertinent positive and negative review of systems were noted in the above HPI section.  All other review of systems was otherwise negative.  Outpatient Encounter Medications as of 03/18/2018  Medication Sig  . acetaminophen (TYLENOL) 500 MG tablet Take 2 tablets (1,000 mg total) by mouth every 6 (six) hours as needed.  . ALPRAZolam (XANAX) 1 MG tablet TAKE 1 TABLET BY MOUTH TWICE DAILY AS NEEDED FOR ANXIETY  . aspirin 81 MG tablet Take 81 mg by mouth daily.    Marland Kitchen atorvastatin (LIPITOR) 20 MG tablet TAKE 1 TABLET BY MOUTH ONCE DAILY  . Biotin 2500 MCG CAPS Take by mouth daily.  Marland Kitchen buPROPion (WELLBUTRIN XL) 300 MG 24 hr tablet TAKE 1 TABLET BY MOUTH ONCE DAILY  . Calcium Carbonate-Vitamin D (CALTRATE 600+D) 600-400 MG-UNIT per tablet Take 1 tablet by mouth daily.    Marland Kitchen losartan (COZAAR) 25 MG tablet TAKE 1 TABLET BY MOUTH ONCE DAILY  . Multiple Vitamin (MULTIVITAMIN) tablet Take 1 tablet by mouth daily.    . Omega-3 Fatty Acids (FISH OIL) 1000 MG CAPS Take by mouth 2 (two) times daily.  Marland Kitchen RA ALLERGY RELIEF 180 MG tablet take 1 tablet by mouth once daily  . vitamin B-12 (  CYANOCOBALAMIN) 1000 MCG tablet Take 1,000 mcg by mouth daily.  Marland Kitchen Hyoscyamine Sulfate SL (LEVSIN/SL) 0.125 MG SUBL Dissolve 1 tablet on the tongue every 6 hours for cramping, loose stools.  . [DISCONTINUED] ciprofloxacin (CIPRO) 500 MG tablet Take 1 tablet (500 mg total) by mouth 2 (two) times daily.  . [DISCONTINUED] metroNIDAZOLE (FLAGYL) 500 MG tablet Take 1 tablet (500 mg total) by mouth 2 (two) times daily.  . [DISCONTINUED] ondansetron (ZOFRAN ODT) 4 MG disintegrating tablet Take 1 tablet (4 mg total) by mouth every 8 (eight) hours as needed for nausea or vomiting.  .  [DISCONTINUED] traMADol (ULTRAM) 50 MG tablet Take 1 tablet (50 mg total) by mouth every 6 (six) hours as needed.   No facility-administered encounter medications on file as of 03/18/2018.    Allergies  Allergen Reactions  . Hydrocodone-Acetaminophen     REACTION: hives, itiching  . Latex     Causes redness and itching  . Lisinopril Cough  . Oxycodone-Acetaminophen Itching    REACTION: itching   Patient Active Problem List   Diagnosis Date Noted  . Insomnia 12/16/2017  . OSA (obstructive sleep apnea) 12/16/2017  . Essential hypertension 03/27/2016  . Hyperglycemia 02/03/2012  . Dyslipidemia 06/30/2011  . ARTHRITIS, TRAUMATIC, ANKLE 01/01/2010  . EXOGENOUS OBESITY 12/26/2008  . ANXIETY DEPRESSION 12/02/2007  . ALLERGIC RHINITIS 04/08/2007  . GERD 04/08/2007   Social History   Socioeconomic History  . Marital status: Widowed    Spouse name: Not on file  . Number of children: 2  . Years of education: Not on file  . Highest education level: Not on file  Occupational History  . Occupation: retired  Scientific laboratory technician  . Financial resource strain: Not on file  . Food insecurity:    Worry: Not on file    Inability: Not on file  . Transportation needs:    Medical: Not on file    Non-medical: Not on file  Tobacco Use  . Smoking status: Never Smoker  . Smokeless tobacco: Never Used  Substance and Sexual Activity  . Alcohol use: Yes    Comment: social  . Drug use: No  . Sexual activity: Not on file  Lifestyle  . Physical activity:    Days per week: Not on file    Minutes per session: Not on file  . Stress: Not on file  Relationships  . Social connections:    Talks on phone: Not on file    Gets together: Not on file    Attends religious service: Not on file    Active member of club or organization: Not on file    Attends meetings of clubs or organizations: Not on file    Relationship status: Not on file  . Intimate partner violence:    Fear of current or ex partner: Not  on file    Emotionally abused: Not on file    Physically abused: Not on file    Forced sexual activity: Not on file  Other Topics Concern  . Not on file  Social History Narrative   Widowed (spouse - Ron of >44years prior to death)   widowed 2011-04-11 - 2 grown supportive dtrs, 5 g-kis (3 are triplets)    Ms. Albert family history includes Anal fissures in her brother; Coronary artery disease in her father; Diabetes in her brother and mother; Heart attack (age of onset: 37) in her brother; Hyperlipidemia in her father and mother; Hypertension in her father and mother; Leukemia in her father.  Objective:    Vitals:   03/18/18 1104  BP: 110/64  Pulse: 84  Temp: 98 F (36.7 C)    Physical Exam; well-developed older white female in no acute distress, pleasant, height 5 foot 7, weight 200, BMI 30.6.  HEENT nontraumatic normocephalic EOMI PERRLA sclera anicteric oral mucosa moist, Neck; supple, Cardiovascular; regular rate and rhythm with S1-S2 no murmur rub or gallop, Pulmonary; clear bilaterally, Abdomen ;soft she has some mild tenderness in the left lower quadrant and across the lower abdomen/suprapubic area there is no guarding or rebound no palpable mass or hepatosplenomegaly bowel sounds are present, Rectal ;exam not done, Extremities; no clubbing cyanosis or edema skin warm and dry, Neuro psych; alert and oriented, grossly nonfocal mood and affect appropriate       Assessment & Plan:   #52 72 year old white female with an episode of acute left-sided colitis.  It is possible this was infectious colitis, suspect more likely a segmental ischemic colitis. She is significantly improved but symptoms have not yet completely resolved.  #2 right hepatic lobe 2.2 cm enhancing lesion-etiology undetermined #3 colon cancer surveillance-up-to-date with one hyperplastic polyp removed at colonoscopy December 2016 #4 hypertension #5 GERD #6 hyperlipidemia  Plan; patient was reassured today  that this was very likely a self-limited process and she will continue to improve over the next couple of weeks.  We discussed the natural history of segmental ischemic colitis Continue soft bland diet and gradually advance as tolerates Suggested she may want to get OTC Imodium for as needed use especially while traveling over the next week or so Will send a prescription for Levsin sublingual 1 every 6 hours as needed for lower abdominal cramping and/or diarrhea. Will be scheduled for MRI of the abdomen attention liver follow-up the incidental liver lesion noted on recent CT.  Further plans pending results of CT. Humphrey Guerreiro S Indra Wolters PA-C 03/18/2018   Cc: Dorothyann Peng, NP

## 2018-03-18 NOTE — Patient Instructions (Addendum)
Try Over the counter imodium as needed. We sent a prescription for Levsin SL- Take 1 tablet by mouth every 6 hours as needed for abdominal cramping/loose stool.   You have been scheduled for an MRI  on 03-29-2018. The location is [address], [facility] [place]. Your appointment time is 9:00 am. Please arrive at 8:45 am to your appointment time for registration purposes. Please make certain not to have anything to eat or drink 4 hours prior to your test. In addition, if you have any metal in your body, have a pacemaker or defibrillator, please be sure to let your ordering physician know. This test typically takes 45 minutes to 1 hour to complete. Should you need to reschedule, please call [phone] to do so.  Normal BMI (Body Mass Index- based on height and weight) is between 23 and 30. Your BMI today is Body mass index is 30.63 kg/m. Marland Kitchen Please consider follow up  regarding your BMI with your Primary Care Provider.

## 2018-03-24 NOTE — Progress Notes (Signed)
Reviewed and agree with documentation and assessment and plan. K. Veena Kaylean Tupou , MD   

## 2018-03-29 ENCOUNTER — Ambulatory Visit (HOSPITAL_COMMUNITY)
Admission: RE | Admit: 2018-03-29 | Discharge: 2018-03-29 | Disposition: A | Discharge status: Home / Self Care | Payer: Medicare Other | Source: Ambulatory Visit | Attending: Physician Assistant | Admitting: Physician Assistant

## 2018-03-29 DIAGNOSIS — K529 Noninfective gastroenteritis and colitis, unspecified: Secondary | ICD-10-CM | POA: Diagnosis not present

## 2018-03-29 DIAGNOSIS — R932 Abnormal findings on diagnostic imaging of liver and biliary tract: Secondary | ICD-10-CM | POA: Insufficient documentation

## 2018-03-29 DIAGNOSIS — R109 Unspecified abdominal pain: Secondary | ICD-10-CM | POA: Diagnosis not present

## 2018-03-29 MED ORDER — GADOBUTROL 1 MMOL/ML IV SOLN
10.0000 mL | Freq: Once | INTRAVENOUS | Status: AC | PRN
  Administered 2018-03-29: 10 mL via INTRAVENOUS

## 2018-03-31 ENCOUNTER — Telehealth: Payer: Self-pay | Admitting: Physician Assistant

## 2018-03-31 NOTE — Telephone Encounter (Signed)
Pt calling regarding results of MRI.

## 2018-04-01 NOTE — Telephone Encounter (Signed)
Pt is calling about her MRI results

## 2018-04-01 NOTE — Telephone Encounter (Signed)
Results read to the patient. She says she is happy about her results and thanks me for the good news.

## 2018-04-22 DIAGNOSIS — Z23 Encounter for immunization: Secondary | ICD-10-CM | POA: Diagnosis not present

## 2018-05-07 DIAGNOSIS — D2272 Melanocytic nevi of left lower limb, including hip: Secondary | ICD-10-CM | POA: Diagnosis not present

## 2018-05-07 DIAGNOSIS — L720 Epidermal cyst: Secondary | ICD-10-CM | POA: Diagnosis not present

## 2018-05-07 DIAGNOSIS — L821 Other seborrheic keratosis: Secondary | ICD-10-CM | POA: Diagnosis not present

## 2018-05-07 DIAGNOSIS — Z85820 Personal history of malignant melanoma of skin: Secondary | ICD-10-CM | POA: Diagnosis not present

## 2018-05-07 DIAGNOSIS — L820 Inflamed seborrheic keratosis: Secondary | ICD-10-CM | POA: Diagnosis not present

## 2018-05-07 DIAGNOSIS — L814 Other melanin hyperpigmentation: Secondary | ICD-10-CM | POA: Diagnosis not present

## 2018-05-07 DIAGNOSIS — L738 Other specified follicular disorders: Secondary | ICD-10-CM | POA: Diagnosis not present

## 2018-05-21 IMAGING — CT CT MAXILLOFACIAL W/O CM
3 of 7 series · 15 of 47 positions shown, 18 images · non-contrast
Comparison: None ; correlation MR brain 12/30/2004

CLINICAL DATA: Tripped over a child at a tertiary bent this evening
and fell trauma face planted normal abrasion edema and ecchymosis at
RIGHT lateral orbit and zygoma area, uncertain loss of
consciousness, facial and head pain, initial encounter

EXAM:
CT HEAD WITHOUT CONTRAST
CT MAXILLOFACIAL WITHOUT CONTRAST
TECHNIQUE: Multidetector CT imaging of the head and maxillofacial structures
were performed using the standard protocol without intravenous
contrast. Multiplanar CT image reconstructions of the maxillofacial
structures were also generated. Right side of face marked with BB.

[Series 3: facial st · axial · 0.29mm/px · z∈[-240,-114]mm · 11 of 75 slices shown, 14 images]
[im 6/75  brain]
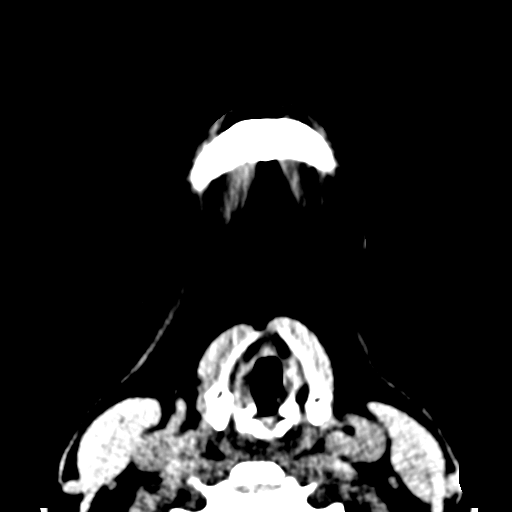
[im 6/75  bone]
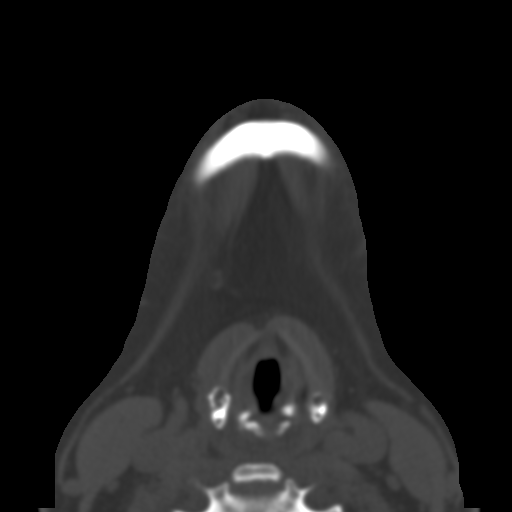
[im 12/75  bone]
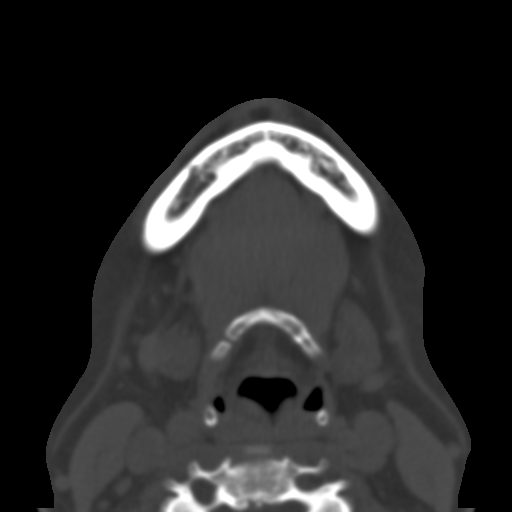
[im 18/75  bone]
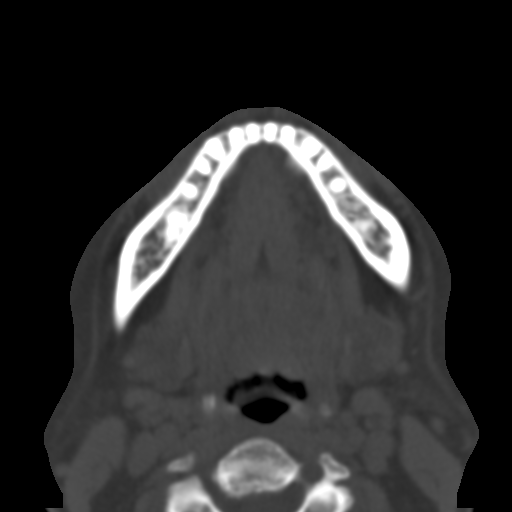
[im 23/75  bone]
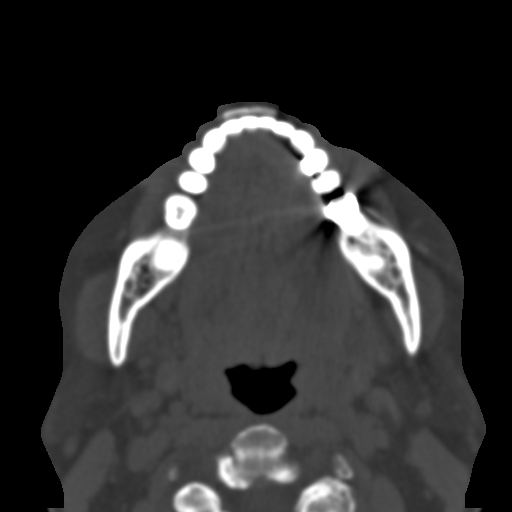
[im 29/75  brain]
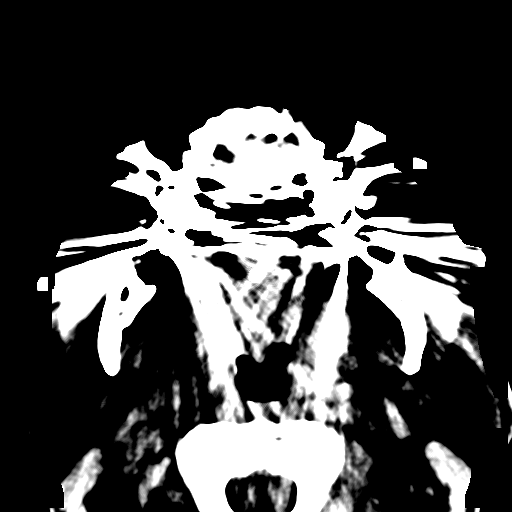
[im 29/75  bone]
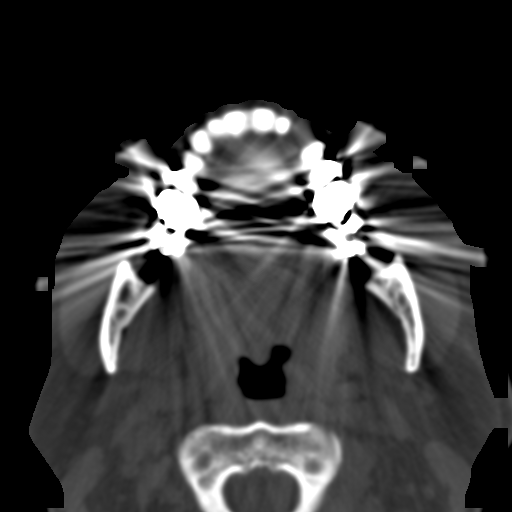
[im 40/75  bone]
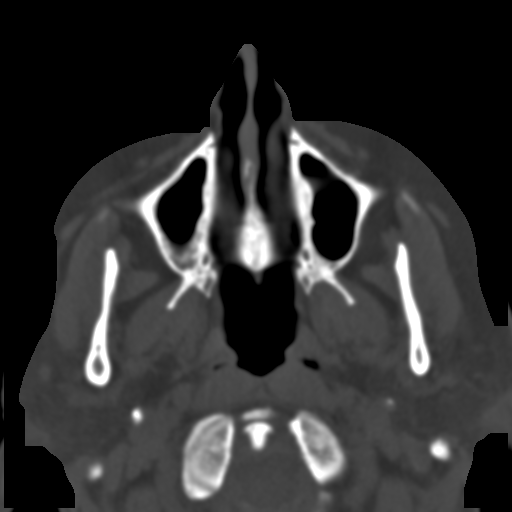
[im 46/75  bone]
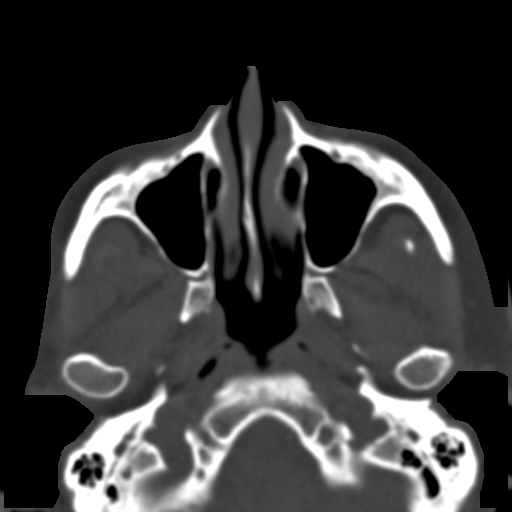
[im 52/75  bone]
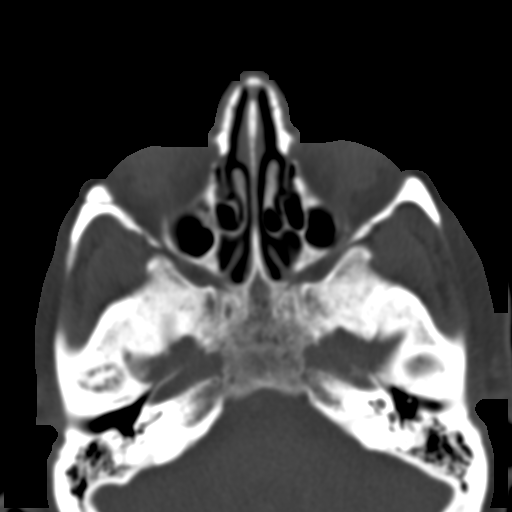
[im 57/75  brain]
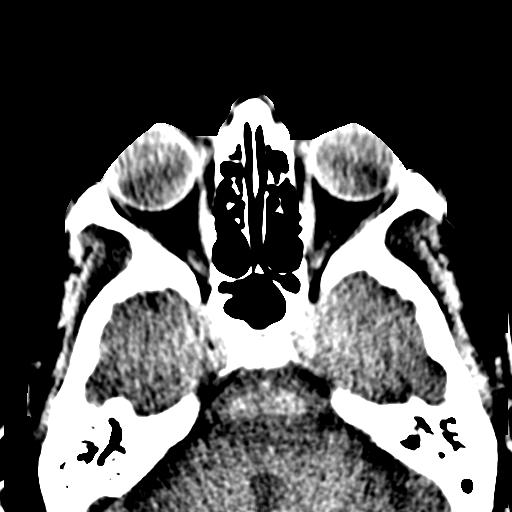
[im 57/75  bone]
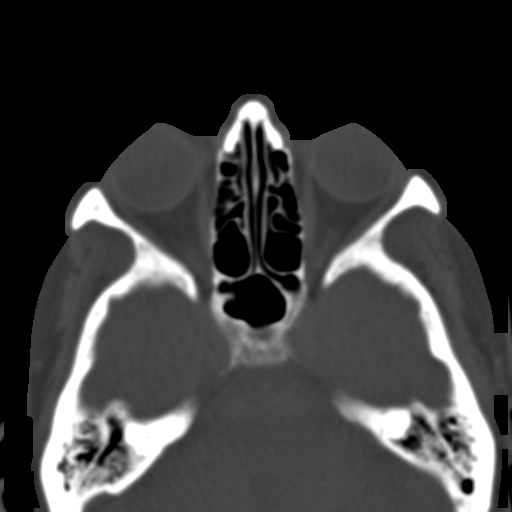
[im 63/75  bone]
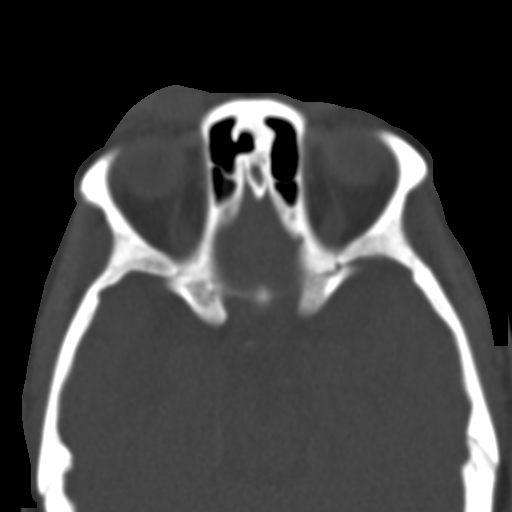
[im 69/75  bone]
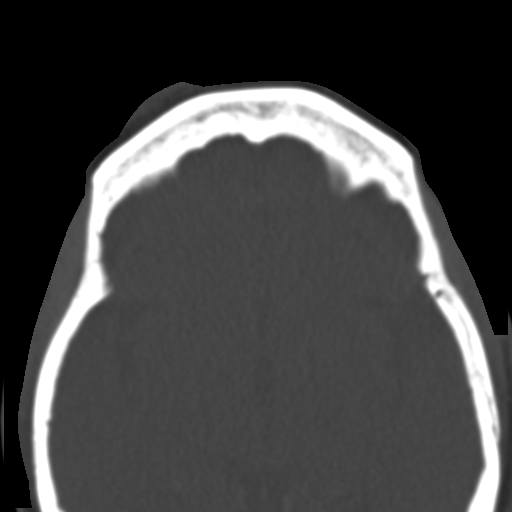

[Series 10: coronal · coronal · 0.29mm/px · 3 of 70 slices shown]
[im 18/70  bone]
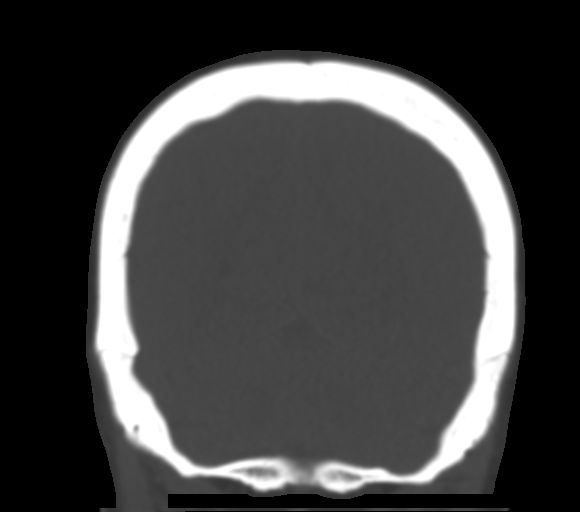
[im 36/70  bone]
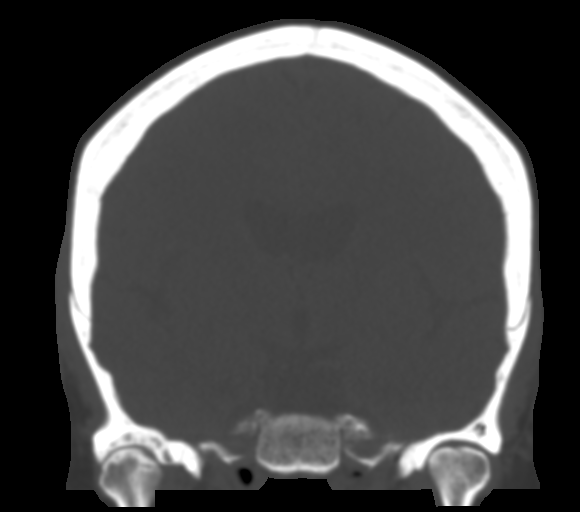
[im 54/70  bone]
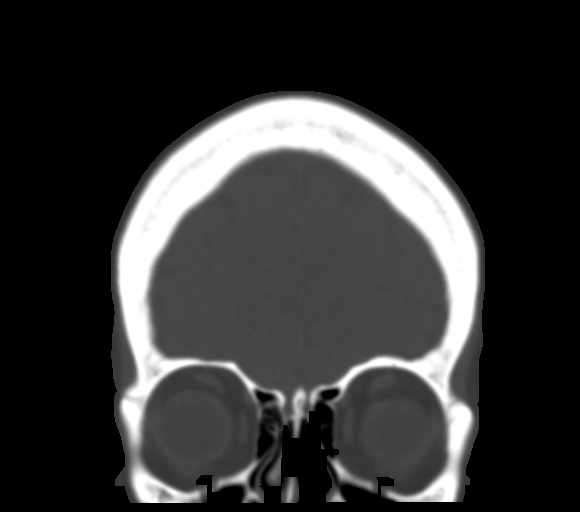

[Series 11: sagittal · sagittal · 0.29mm/px · 1 of 56 slices shown]
[im 28/56  bone]
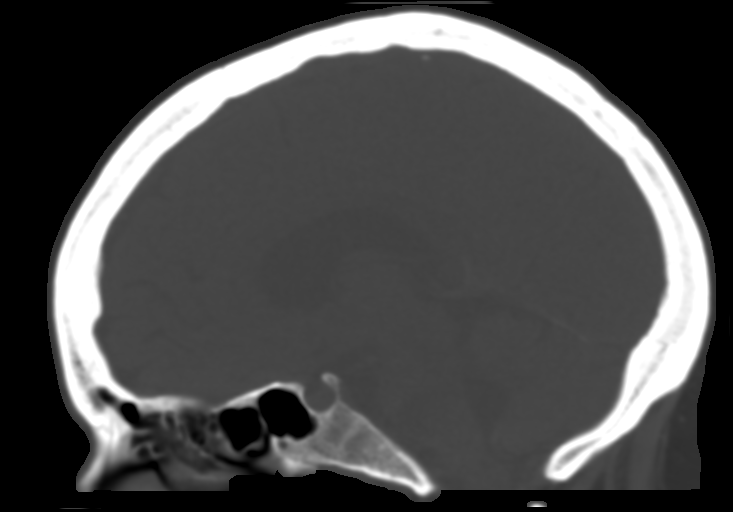

[15 of 47 positions shown; findings below may reference images not displayed]

FINDINGS: CT HEAD FINDINGS

Brain: Normal ventricular morphology. No midline shift or mass
effect. Minimal small vessel chronic ischemic changes of deep
cerebral white matter. No intracranial hemorrhage, mass lesion, or
evidence acute infarction. No extra-axial fluid collections.

Vascular: Unremarkable

Skull: Intact

Other: N/A

CT MAXILLOFACIAL FINDINGS

Osseous: Scattered beam hardening artifacts of dental origin. Normal
TMJ alignment bilaterally. Minimal bowing of nasal septum to the
RIGHT. No definite facial bone fractures. Small amount of periosteal
new bone identified anterior to the anterior LEFT maxilla, question
related to healing at site of an absent tooth #11.

Orbits: Clear intraorbital soft tissue planes.  Bony orbits intact.

Sinuses: Paranasal sinuses, mastoid air cells, and middle ear
cavities clear bilaterally

Soft tissues: RIGHT periorbital hematoma contusion. Remaining
regional soft tissues clear. Symmetric parotid and submandibular
glands.
IMPRESSION: Minimal small vessel chronic ischemic changes of deep cerebral white
matter.

No acute intracranial abnormalities.

No acute facial bone fracture seen.

Periosteal new bone identified adjacent to the anterior LEFT
maxilla, question related to absence of tooth #11 question prior
dental extraction.

## 2018-05-25 DIAGNOSIS — Z803 Family history of malignant neoplasm of breast: Secondary | ICD-10-CM | POA: Diagnosis not present

## 2018-05-25 DIAGNOSIS — Z1231 Encounter for screening mammogram for malignant neoplasm of breast: Secondary | ICD-10-CM | POA: Diagnosis not present

## 2018-05-25 LAB — HM MAMMOGRAPHY

## 2018-05-28 ENCOUNTER — Encounter: Payer: Self-pay | Admitting: Family Medicine

## 2018-06-07 ENCOUNTER — Other Ambulatory Visit: Payer: Self-pay | Admitting: Adult Health

## 2018-06-07 DIAGNOSIS — E785 Hyperlipidemia, unspecified: Secondary | ICD-10-CM

## 2018-06-07 NOTE — Telephone Encounter (Signed)
Sent to the pharmacy by e-scribe. 

## 2018-06-08 ENCOUNTER — Other Ambulatory Visit: Payer: Self-pay | Admitting: Adult Health

## 2018-06-08 DIAGNOSIS — F419 Anxiety disorder, unspecified: Principal | ICD-10-CM

## 2018-06-08 DIAGNOSIS — I10 Essential (primary) hypertension: Secondary | ICD-10-CM

## 2018-06-08 DIAGNOSIS — F329 Major depressive disorder, single episode, unspecified: Secondary | ICD-10-CM

## 2018-07-16 ENCOUNTER — Other Ambulatory Visit: Payer: Self-pay | Admitting: Adult Health

## 2018-07-16 DIAGNOSIS — F329 Major depressive disorder, single episode, unspecified: Secondary | ICD-10-CM

## 2018-07-16 DIAGNOSIS — F419 Anxiety disorder, unspecified: Principal | ICD-10-CM

## 2018-07-16 NOTE — Telephone Encounter (Signed)
Copied from [place] [id number]. Topic: Quick Communication - Rx Refill/Question >> Jul 16, 2018  3:48 PM Blase Mess A wrote: Medication: ALPRAZolam Duanne Moron) 1 MG tablet [[id number]] Appt 07/2518  Has the patient contacted their pharmacy? Yes  (Agent: If no, request that the patient contact the pharmacy for the refill.) (Agent: If yes, when and what did the pharmacy advise?)  Preferred Pharmacy (with phone number or street name): [facility] [place], [address] AT [address] [phone] (Phone) [phone] (    Agent: Please be advised that RX refills may take up to 3 business days. We ask that you follow-up with your pharmacy.

## 2018-07-20 ENCOUNTER — Other Ambulatory Visit: Payer: Self-pay | Admitting: Adult Health

## 2018-07-20 DIAGNOSIS — F419 Anxiety disorder, unspecified: Principal | ICD-10-CM

## 2018-07-20 DIAGNOSIS — F329 Major depressive disorder, single episode, unspecified: Secondary | ICD-10-CM

## 2018-07-20 MED ORDER — ALPRAZOLAM 1 MG PO TABS: 1.0000 mg | ORAL_TABLET | Freq: Two times a day (BID) | ORAL | 2 refills | Status: DC | PRN

## 2018-07-21 NOTE — Telephone Encounter (Signed)
FILLED ON 07/20/2018.  DUPLICATE REQUEST

## 2018-07-28 ENCOUNTER — Ambulatory Visit (INDEPENDENT_AMBULATORY_CARE_PROVIDER_SITE_OTHER): Payer: PPO | Admitting: Adult Health

## 2018-07-28 ENCOUNTER — Encounter: Payer: Self-pay | Admitting: Adult Health

## 2018-07-28 VITALS — BP 130/86 | Temp 98.0°F | Ht 68.0 in | Wt 200.0 lb

## 2018-07-28 DIAGNOSIS — F341 Dysthymic disorder: Secondary | ICD-10-CM | POA: Diagnosis not present

## 2018-07-28 DIAGNOSIS — I10 Essential (primary) hypertension: Secondary | ICD-10-CM | POA: Diagnosis not present

## 2018-07-28 DIAGNOSIS — Z0000 Encounter for general adult medical examination without abnormal findings: Secondary | ICD-10-CM | POA: Diagnosis not present

## 2018-07-28 DIAGNOSIS — E785 Hyperlipidemia, unspecified: Secondary | ICD-10-CM

## 2018-07-28 LAB — COMPREHENSIVE METABOLIC PANEL
ALK PHOS: 66 U/L (ref 39–117)
ALT: 14 U/L (ref 0–35)
AST: 15 U/L (ref 0–37)
Albumin: 4.3 g/dL (ref 3.5–5.2)
BILIRUBIN TOTAL: 0.4 mg/dL (ref 0.2–1.2)
BUN: 15 mg/dL (ref 6–23)
CO2: 31 mEq/L (ref 19–32)
CREATININE: 0.92 mg/dL (ref 0.40–1.20)
Calcium: 9.8 mg/dL (ref 8.4–10.5)
Chloride: 104 mEq/L (ref 96–112)
GFR: 63.73 mL/min (ref >60.00)
GLUCOSE: 86 mg/dL (ref 70–99)
Potassium: 4.3 mEq/L (ref 3.5–5.1)
SODIUM: 141 meq/L (ref 135–145)
TOTAL PROTEIN: 7.2 g/dL (ref 6.0–8.3)

## 2018-07-28 LAB — CBC WITH DIFFERENTIAL/PLATELET
BASOS ABS: 0.1 10*3/uL (ref 0.0–0.1)
Basophils Relative: 0.6 % (ref 0.0–3.0)
EOS ABS: 0.2 10*3/uL (ref 0.0–0.7)
Eosinophils Relative: 1.9 % (ref 0.0–5.0)
HCT: 42.2 % (ref 36.0–46.0)
Hemoglobin: 14.2 g/dL (ref 12.0–15.0)
LYMPHS ABS: 2.1 10*3/uL (ref 0.7–4.0)
Lymphocytes Relative: 22.9 % (ref 12.0–46.0)
MCHC: 33.5 g/dL (ref 30.0–36.0)
MCV: 88.7 fl (ref 78.0–100.0)
MONO ABS: 0.6 10*3/uL (ref 0.1–1.0)
MONOS PCT: 6.2 % (ref 3.0–12.0)
NEUTROS PCT: 68.4 % (ref 43.0–77.0)
Neutro Abs: 6.2 10*3/uL (ref 1.4–7.7)
Platelets: 256 10*3/uL (ref 150.0–400.0)
RBC: 4.77 Mil/uL (ref 3.87–5.11)
RDW: 13 % (ref 11.5–15.5)
WBC: 9 10*3/uL (ref 4.0–10.5)

## 2018-07-28 LAB — LIPID PANEL
Cholesterol: 127 mg/dL (ref 0–200)
HDL: 53.3 mg/dL (ref >39.00)
LDL Cholesterol: 40 mg/dL (ref 0–99)
NONHDL: 73.48
Total CHOL/HDL Ratio: 2
Triglycerides: 167 mg/dL — ABNORMAL HIGH (ref 0.0–149.0)
VLDL: 33.4 mg/dL (ref 0.0–40.0)

## 2018-07-28 LAB — HEMOGLOBIN A1C: Hgb A1c MFr Bld: 6 % (ref 4.6–6.5)

## 2018-07-28 LAB — TSH: TSH: 1.64 u[IU]/mL (ref 0.35–4.50)

## 2018-07-28 NOTE — Progress Notes (Signed)
Subjective:    Patient ID: Julie [name], female    DOB: [birth year], 73 y.o.   MRN: [id number]  HPI  Patient presents for yearly preventative medicine examination. She is a pleasant 73 year old female who  has a past medical history of ALLERGIC RHINITIS, Anxiety, Arthritis, ARTHRITIS, TRAUMATIC, ANKLE, Cataract, Colitis, Depression, Dyslipidemia, EXOGENOUS OBESITY, GERD (gastroesophageal reflux disease), Hemorrhoids, Hyperglycemia, and Melanoma in situ of left lower leg ([place]).   Hyperlipidemia - takes lipitor 20 mg and ASA 81 mg  Lab Results  Component Value Date   CHOL 124 06/29/2017   HDL 54.90 06/29/2017   LDLCALC 50 06/29/2017   LDLDIRECT 130.6 12/18/2008   TRIG 96.0 06/29/2017   CHOLHDL 2 06/29/2017   Essential Hypertension - Controlled with Cozaar 25 mg  BP Readings from Last 3 Encounters:  07/28/18 130/86  03/18/18 110/64  02/27/18 124/76   Anxiety and Depression- Currently prescribed Wellbutrin and Xanax. She does not feel as though she is well controlled and the holidays are always tough for her. She does not want to switch medications   All immunizations and health maintenance protocols were reviewed with the patient and needed orders were placed. utd  Appropriate screening laboratory values were ordered for the patient including screening of hyperlipidemia, renal function and hepatic function.  Medication reconciliation,  past medical history, social history, problem list and allergies were reviewed in detail with the patient  Goals were established with regard to weight loss, exercise, and  diet in compliance with medications. She is not exercising or eating healthy.  Wt Readings from Last 3 Encounters:  07/28/18 200 lb (90.7 kg)  03/18/18 200 lb (90.7 kg)  12/16/17 200 lb 9.6 oz (91 kg)   End of life planning was discussed. She does not want info on a living will or   She is up to date on routine screening colonoscopy, mammogram   Review of Systems    Constitutional: Negative.   HENT: Negative.   Eyes: Negative.   Respiratory: Negative.   Cardiovascular: Negative.   Gastrointestinal: Negative.   Endocrine: Negative.   Genitourinary: Negative.   Musculoskeletal: Positive for arthralgias and back pain.  Skin: Negative.   Allergic/Immunologic: Negative.   Neurological: Negative.   Hematological: Negative.   Psychiatric/Behavioral: Negative.   All other systems reviewed and are negative.  Past Medical History:  Diagnosis Date  . ALLERGIC RHINITIS   . Anxiety    chronic BZs  . Arthritis   . ARTHRITIS, TRAUMATIC, ANKLE   . Cataract   . Colitis   . Depression   . Dyslipidemia   . EXOGENOUS OBESITY   . GERD (gastroesophageal reflux disease)   . Hemorrhoids   . Hyperglycemia   . Melanoma in situ of left lower leg ([place])     Social History   Socioeconomic History  . Marital status: Widowed    Spouse name: Not on file  . Number of children: 2  . Years of education: Not on file  . Highest education level: Not on file  Occupational History  . Occupation: retired  Scientific laboratory technician  . Financial resource strain: Not on file  . Food insecurity:    Worry: Not on file    Inability: Not on file  . Transportation needs:    Medical: Not on file    Non-medical: Not on file  Tobacco Use  . Smoking status: Never Smoker  . Smokeless tobacco: Never Used  Substance and Sexual Activity  . Alcohol use: Yes  Comment: social  . Drug use: No  . Sexual activity: Not on file  Lifestyle  . Physical activity:    Days per week: Not on file    Minutes per session: Not on file  . Stress: Not on file  Relationships  . Social connections:    Talks on phone: Not on file    Gets together: Not on file    Attends religious service: Not on file    Active member of club or organization: Not on file    Attends meetings of clubs or organizations: Not on file    Relationship status: Not on file  . Intimate partner violence:    Fear of  current or ex partner: Not on file    Emotionally abused: Not on file    Physically abused: Not on file    Forced sexual activity: Not on file  Other Topics Concern  . Not on file  Social History Narrative   Widowed (spouse - Ron of >44years prior to death)   widowed 04-19-11 - 2 grown supportive dtrs, 5 g-kis (3 are triplets)    Past Surgical History:  Procedure Laterality Date  . ABDOMINAL HYSTERECTOMY  10/1978  . ANKLE ARTHROSCOPY Right 10/19/12   deorio  . arthroscopy left knee  05/2002, 04/2010  . BREAST SURGERY  11/1988   Benign mass (R) breast  . C. surgery history and scan sheet    . Cataract surgery     (L) eye 09/06/12 & (R) eye 10/11/12  . CHOLECYSTECTOMY  09/2004  . cysto sling  10/2006   3 times  . FRACTURE SURGERY     right ankle  . JOINT REPLACEMENT  06/2010   R ankle  . MASS EXCISION     under right arm  . removal multiple cyst breast left and right    . sling anterior repair  12/1999  . SPINE SURGERY     laminotomy with decompression  . Toe Exostosis  10/08   Bil  . TOTAL ANKLE REPLACEMENT Right     Family History  Problem Relation Age of Onset  . Hyperlipidemia Father   . Coronary artery disease Father        cabg 70yo  . Leukemia Father   . Hypertension Father   . Heart attack Brother 24       stents x 2 (36 & 37 yo)  . Diabetes Brother   . Anal fissures Brother   . Diabetes Mother   . Hyperlipidemia Mother   . Hypertension Mother   . Colon cancer Neg Hx   . Esophageal cancer Neg Hx     Allergies  Allergen Reactions  . Hydrocodone-Acetaminophen     REACTION: hives, itiching  . Latex     Causes redness and itching  . Lisinopril Cough  . Oxycodone-Acetaminophen Itching    REACTION: itching    Current Outpatient Medications on File Prior to Visit  Medication Sig Dispense Refill  . acetaminophen (TYLENOL) 500 MG tablet Take 2 tablets (1,000 mg total) by mouth every 6 (six) hours as needed. 30 tablet 0  . ALPRAZolam (XANAX) 1 MG tablet Take  1 tablet (1 mg total) by mouth 2 (two) times daily as needed. for anxiety 60 tablet 2  . aspirin 81 MG tablet Take 81 mg by mouth daily.      Marland Kitchen atorvastatin (LIPITOR) 20 MG tablet TAKE 1 TABLET BY MOUTH ONCE DAILY 90 tablet 0  . Biotin 2500 MCG CAPS Take by mouth  daily.    . buPROPion (WELLBUTRIN XL) 300 MG 24 hr tablet TAKE 1 TABLET BY MOUTH ONCE DAILY 90 tablet 0  . Calcium Carbonate-Vitamin D (CALTRATE 600+D) 600-400 MG-UNIT per tablet Take 1 tablet by mouth daily.      Marland Kitchen losartan (COZAAR) 25 MG tablet TAKE 1 TABLET BY MOUTH ONCE DAILY 90 tablet 0  . Multiple Vitamin (MULTIVITAMIN) tablet Take 1 tablet by mouth daily.      . Omega-3 Fatty Acids (FISH OIL) 1000 MG CAPS Take by mouth 2 (two) times daily.    Marland Kitchen RA ALLERGY RELIEF 180 MG tablet take 1 tablet by mouth once daily 30 tablet 5  . vitamin B-12 (CYANOCOBALAMIN) 1000 MCG tablet Take 1,000 mcg by mouth daily.     No current facility-administered medications on file prior to visit.     BP 130/86   Temp 98 F (36.7 C)   Ht 5\' 8"  (1.727 m)   Wt 200 lb (90.7 kg)   BMI 30.41 kg/m       Objective:   Physical Exam Vitals signs and nursing note reviewed.  Constitutional:      General: She is not in acute distress.    Appearance: Normal appearance. She is well-developed. She is obese.  HENT:     Head: Normocephalic and atraumatic.     Right Ear: Tympanic membrane, ear canal and external ear normal.     Left Ear: Tympanic membrane, ear canal and external ear normal.     Nose: Nose normal.     Mouth/Throat:     Mouth: Mucous membranes are moist.     Pharynx: Oropharynx is clear. No oropharyngeal exudate.  Eyes:     General: No scleral icterus.       Right eye: No discharge.        Left eye: No discharge.     Extraocular Movements: Extraocular movements intact.     Conjunctiva/sclera: Conjunctivae normal.     Pupils: Pupils are equal, round, and reactive to light.  Neck:     Musculoskeletal: Normal range of motion and neck  supple.     Thyroid: No thyromegaly.     Trachea: No tracheal deviation.  Cardiovascular:     Rate and Rhythm: Normal rate and regular rhythm.     Heart sounds: Normal heart sounds. No murmur. No friction rub. No gallop.   Pulmonary:     Effort: Pulmonary effort is normal. No respiratory distress.     Breath sounds: Normal breath sounds. No wheezing or rales.  Chest:     Chest wall: No tenderness.  Abdominal:     General: Bowel sounds are normal. There is no distension.     Palpations: Abdomen is soft. There is no mass.     Tenderness: There is no abdominal tenderness. There is no right CVA tenderness, left CVA tenderness, guarding or rebound.     Hernia: No hernia is present.  Musculoskeletal: Normal range of motion.        General: No swelling, tenderness, deformity or signs of injury.     Right lower leg: No edema.  Lymphadenopathy:     Cervical: No cervical adenopathy.  Skin:    General: Skin is warm and dry.     Capillary Refill: Capillary refill takes less than 2 seconds.     Coloration: Skin is not jaundiced or pale.     Findings: No erythema, lesion or rash.  Neurological:     General: No focal deficit present.  Mental Status: She is alert and oriented to person, place, and time. Mental status is at baseline.     Cranial Nerves: No cranial nerve deficit.     Coordination: Coordination normal.  Psychiatric:        Mood and Affect: Mood normal.        Behavior: Behavior normal.        Thought Content: Thought content normal.        Judgment: Judgment normal.       Assessment & Plan:  1. Routine general medical examination at a health care facility - CBC with Differential/Platelet - Comprehensive metabolic panel - Lipid panel - TSH - Hemoglobin A1c  2. Dyslipidemia - Consider increase in statin  - Needs to work on diet and exercise  - CBC with Differential/Platelet - Comprehensive metabolic panel - Lipid panel - TSH - Hemoglobin A1c  3. Essential  hypertension - no change in medications  - CBC with Differential/Platelet - Comprehensive metabolic panel - Lipid panel - TSH - Hemoglobin A1c  4. ANXIETY DEPRESSION - Continue with current therapy  - Encouraged exercise to help with depression  - Follow up at any time to discuss medication changed    Dorothyann Peng, NP

## 2018-09-06 ENCOUNTER — Other Ambulatory Visit: Payer: Self-pay | Admitting: Adult Health

## 2018-09-06 DIAGNOSIS — E785 Hyperlipidemia, unspecified: Secondary | ICD-10-CM

## 2018-09-06 DIAGNOSIS — F329 Major depressive disorder, single episode, unspecified: Secondary | ICD-10-CM

## 2018-09-06 DIAGNOSIS — F32A Depression, unspecified: Secondary | ICD-10-CM

## 2018-09-06 DIAGNOSIS — I10 Essential (primary) hypertension: Secondary | ICD-10-CM

## 2018-09-06 DIAGNOSIS — F419 Anxiety disorder, unspecified: Principal | ICD-10-CM

## 2018-09-07 NOTE — Telephone Encounter (Signed)
Sent to the pharmacy by e-scribe. 

## 2018-11-13 ENCOUNTER — Other Ambulatory Visit: Payer: Self-pay | Admitting: Adult Health

## 2018-11-13 DIAGNOSIS — F329 Major depressive disorder, single episode, unspecified: Secondary | ICD-10-CM

## 2018-11-13 DIAGNOSIS — F419 Anxiety disorder, unspecified: Principal | ICD-10-CM

## 2019-02-16 ENCOUNTER — Other Ambulatory Visit: Payer: Self-pay

## 2019-02-16 DIAGNOSIS — Z20822 Contact with and (suspected) exposure to covid-19: Secondary | ICD-10-CM

## 2019-02-18 LAB — NOVEL CORONAVIRUS, NAA: SARS-CoV-2, NAA: NOT DETECTED

## 2019-02-24 ENCOUNTER — Telehealth: Payer: Self-pay | Admitting: Adult Health

## 2019-02-24 NOTE — Telephone Encounter (Signed)
Pt calling.  States that grandchildren have tested positive on 08/04 and 08/06 and were asymptomatic. Daughter and son in law were both positive and symptomatic. Pt states that she was tested on 08/05 because she had been around them for 15 minutes a week prior.  Pt's test results came back negative. Pt wants to know when it is safe for her to keep grandkids again.

## 2019-02-24 NOTE — Telephone Encounter (Signed)
See note

## 2019-03-01 NOTE — Telephone Encounter (Signed)
She should be fine to keep her grandchildren at this point in time.  The  issue with COVID testing is that she was negative on August 5th and she may not be now if she has come in contact with anyone that had Covid

## 2019-03-01 NOTE — Telephone Encounter (Signed)
Spoke to [place] and informed her of of message below.  She states she is going to keep the grand kids but will be wearing a mask and so will they.  Nothing further needed.

## 2019-03-05 ENCOUNTER — Other Ambulatory Visit: Payer: Self-pay | Admitting: Adult Health

## 2019-03-05 DIAGNOSIS — F419 Anxiety disorder, unspecified: Secondary | ICD-10-CM

## 2019-03-05 DIAGNOSIS — F329 Major depressive disorder, single episode, unspecified: Secondary | ICD-10-CM

## 2019-03-08 DIAGNOSIS — H5213 Myopia, bilateral: Secondary | ICD-10-CM | POA: Diagnosis not present

## 2019-03-08 DIAGNOSIS — H524 Presbyopia: Secondary | ICD-10-CM | POA: Diagnosis not present

## 2019-03-08 DIAGNOSIS — H02055 Trichiasis without entropian left lower eyelid: Secondary | ICD-10-CM | POA: Diagnosis not present

## 2019-03-08 NOTE — Telephone Encounter (Signed)
Sent to the pharmacy by e-scribe. 

## 2019-04-05 ENCOUNTER — Other Ambulatory Visit: Payer: Self-pay | Admitting: Adult Health

## 2019-04-05 DIAGNOSIS — F419 Anxiety disorder, unspecified: Secondary | ICD-10-CM

## 2019-04-05 DIAGNOSIS — F329 Major depressive disorder, single episode, unspecified: Secondary | ICD-10-CM

## 2019-05-25 DIAGNOSIS — L720 Epidermal cyst: Secondary | ICD-10-CM | POA: Diagnosis not present

## 2019-05-25 DIAGNOSIS — L821 Other seborrheic keratosis: Secondary | ICD-10-CM | POA: Diagnosis not present

## 2019-05-25 DIAGNOSIS — D2272 Melanocytic nevi of left lower limb, including hip: Secondary | ICD-10-CM | POA: Diagnosis not present

## 2019-05-25 DIAGNOSIS — Z85820 Personal history of malignant melanoma of skin: Secondary | ICD-10-CM | POA: Diagnosis not present

## 2019-05-25 DIAGNOSIS — L738 Other specified follicular disorders: Secondary | ICD-10-CM | POA: Diagnosis not present

## 2019-05-30 DIAGNOSIS — Z803 Family history of malignant neoplasm of breast: Secondary | ICD-10-CM | POA: Diagnosis not present

## 2019-05-30 DIAGNOSIS — Z1231 Encounter for screening mammogram for malignant neoplasm of breast: Secondary | ICD-10-CM | POA: Diagnosis not present

## 2019-05-30 LAB — HM MAMMOGRAPHY

## 2019-06-30 ENCOUNTER — Other Ambulatory Visit: Payer: Self-pay | Admitting: Adult Health

## 2019-06-30 DIAGNOSIS — F329 Major depressive disorder, single episode, unspecified: Secondary | ICD-10-CM

## 2019-06-30 DIAGNOSIS — F419 Anxiety disorder, unspecified: Secondary | ICD-10-CM

## 2019-07-13 ENCOUNTER — Encounter: Payer: Self-pay | Admitting: Family Medicine

## 2019-08-28 ENCOUNTER — Ambulatory Visit: Payer: PPO | Attending: Internal Medicine

## 2019-08-28 DIAGNOSIS — Z23 Encounter for immunization: Secondary | ICD-10-CM | POA: Insufficient documentation

## 2019-08-28 NOTE — Progress Notes (Signed)
   Covid-19 Vaccination Clinic  Name:  Julie [name]    MRN: [id number] DOB: 04-May-1946  08/28/2019  [name] was observed post Covid-19 immunization for 15 minutes without incidence. She was provided with Vaccine Information Sheet and instruction to access the V-Safe system.   [name] was instructed to call 911 with any severe reactions post vaccine: Marland Kitchen Difficulty breathing  . Swelling of your face and throat  . A fast heartbeat  . A bad rash all over your body  . Dizziness and weakness    Immunizations Administered    Name Date Dose VIS Date Route   Pfizer COVID-19 Vaccine 08/28/2019  1:42 PM 0.3 mL 06/24/2019 Intramuscular   Manufacturer: [place]   Lot: [id number]   [place]: [id number]

## 2019-09-08 ENCOUNTER — Other Ambulatory Visit: Payer: Self-pay | Admitting: Family Medicine

## 2019-09-08 DIAGNOSIS — F32A Depression, unspecified: Secondary | ICD-10-CM

## 2019-09-08 DIAGNOSIS — F329 Major depressive disorder, single episode, unspecified: Secondary | ICD-10-CM

## 2019-09-08 MED ORDER — BUPROPION HCL ER (XL) 300 MG PO TB24: 300.0000 mg | ORAL_TABLET | Freq: Every day | ORAL | 0 refills | Status: DC

## 2019-09-08 NOTE — Telephone Encounter (Signed)
Sent to the pharmacy by e-scribe for 90 days.  Pt has upcoming appointment on 10/21/2019 for cpx.

## 2019-09-09 ENCOUNTER — Other Ambulatory Visit: Payer: Self-pay

## 2019-09-09 DIAGNOSIS — I10 Essential (primary) hypertension: Secondary | ICD-10-CM

## 2019-09-09 DIAGNOSIS — E785 Hyperlipidemia, unspecified: Secondary | ICD-10-CM

## 2019-09-09 MED ORDER — ATORVASTATIN CALCIUM 20 MG PO TABS: 20.0000 mg | ORAL_TABLET | Freq: Every day | ORAL | 0 refills | Status: DC

## 2019-09-09 MED ORDER — LOSARTAN POTASSIUM 25 MG PO TABS: 25.0000 mg | ORAL_TABLET | Freq: Every day | ORAL | 0 refills | Status: DC

## 2019-09-20 ENCOUNTER — Ambulatory Visit: Payer: PPO | Attending: Internal Medicine

## 2019-09-20 DIAGNOSIS — Z23 Encounter for immunization: Secondary | ICD-10-CM | POA: Insufficient documentation

## 2019-09-20 NOTE — Progress Notes (Signed)
   Covid-19 Vaccination Clinic  Name:  JAMELLE MANOUKIAN    MRN: [id number] DOB: February 09, 1946  09/20/2019  Ms. Coven was observed post Covid-19 immunization for 15 minutes without incident. She was provided with Vaccine Information Sheet and instruction to access the V-Safe system.   Ms. Soens was instructed to call 911 with any severe reactions post vaccine: Marland Kitchen Difficulty breathing  . Swelling of face and throat  . A fast heartbeat  . A bad rash all over body  . Dizziness and weakness   Immunizations Administered    Name Date Dose VIS Date Route   Pfizer COVID-19 Vaccine 09/20/2019  3:29 PM 0.3 mL 06/24/2019 Intramuscular   Manufacturer: [place]   Lot: [id number]   [place]: [id number]

## 2019-09-21 ENCOUNTER — Ambulatory Visit: Payer: PPO

## 2019-10-21 ENCOUNTER — Other Ambulatory Visit: Payer: Self-pay

## 2019-10-21 ENCOUNTER — Encounter: Payer: Self-pay | Admitting: Adult Health

## 2019-10-21 ENCOUNTER — Ambulatory Visit (INDEPENDENT_AMBULATORY_CARE_PROVIDER_SITE_OTHER): Payer: PPO | Admitting: Adult Health

## 2019-10-21 VITALS — BP 114/72 | Temp 97.8°F | Ht 67.5 in | Wt 201.0 lb

## 2019-10-21 DIAGNOSIS — F341 Dysthymic disorder: Secondary | ICD-10-CM

## 2019-10-21 DIAGNOSIS — E785 Hyperlipidemia, unspecified: Secondary | ICD-10-CM

## 2019-10-21 DIAGNOSIS — I10 Essential (primary) hypertension: Secondary | ICD-10-CM

## 2019-10-21 DIAGNOSIS — Z0000 Encounter for general adult medical examination without abnormal findings: Secondary | ICD-10-CM

## 2019-10-21 LAB — CBC WITH DIFFERENTIAL/PLATELET
Basophils Absolute: 0.1 10*3/uL (ref 0.0–0.1)
Basophils Relative: 0.8 % (ref 0.0–3.0)
Eosinophils Absolute: 0.2 10*3/uL (ref 0.0–0.7)
Eosinophils Relative: 1.8 % (ref 0.0–5.0)
HCT: 39 % (ref 36.0–46.0)
Hemoglobin: 13.1 g/dL (ref 12.0–15.0)
Lymphocytes Relative: 22.4 % (ref 12.0–46.0)
Lymphs Abs: 1.9 10*3/uL (ref 0.7–4.0)
MCHC: 33.6 g/dL (ref 30.0–36.0)
MCV: 89.4 fl (ref 78.0–100.0)
Monocytes Absolute: 0.5 10*3/uL (ref 0.1–1.0)
Monocytes Relative: 5.9 % (ref 3.0–12.0)
Neutro Abs: 5.8 10*3/uL (ref 1.4–7.7)
Neutrophils Relative %: 69.1 % (ref 43.0–77.0)
Platelets: 235 10*3/uL (ref 150.0–400.0)
RBC: 4.37 Mil/uL (ref 3.87–5.11)
RDW: 13 % (ref 11.5–15.5)
WBC: 8.4 10*3/uL (ref 4.0–10.5)

## 2019-10-21 LAB — COMPREHENSIVE METABOLIC PANEL
ALT: 14 U/L (ref 0–35)
AST: 15 U/L (ref 0–37)
Albumin: 4.2 g/dL (ref 3.5–5.2)
Alkaline Phosphatase: 69 U/L (ref 39–117)
BUN: 11 mg/dL (ref 6–23)
CO2: 28 mEq/L (ref 19–32)
Calcium: 9.4 mg/dL (ref 8.4–10.5)
Chloride: 104 mEq/L (ref 96–112)
Creatinine, Ser: 0.98 mg/dL (ref 0.40–1.20)
GFR: 55.56 mL/min — ABNORMAL LOW (ref >60.00)
Glucose, Bld: 106 mg/dL — ABNORMAL HIGH (ref 70–99)
Potassium: 4.6 mEq/L (ref 3.5–5.1)
Sodium: 140 mEq/L (ref 135–145)
Total Bilirubin: 0.6 mg/dL (ref 0.2–1.2)
Total Protein: 6.7 g/dL (ref 6.0–8.3)

## 2019-10-21 LAB — LIPID PANEL
Cholesterol: 131 mg/dL (ref 0–200)
HDL: 53 mg/dL (ref >39.00)
LDL Cholesterol: 54 mg/dL (ref 0–99)
NonHDL: 77.77
Total CHOL/HDL Ratio: 2
Triglycerides: 118 mg/dL (ref 0.0–149.0)
VLDL: 23.6 mg/dL (ref 0.0–40.0)

## 2019-10-21 LAB — TSH: TSH: 1.32 u[IU]/mL (ref 0.35–4.50)

## 2019-10-21 NOTE — Patient Instructions (Signed)
It was great seeing you today!   I will follow up with you regarding your blood work   Start being more active and eating healthy

## 2019-10-21 NOTE — Progress Notes (Signed)
Subjective:    Patient ID: Julie [name], female    DOB: 11-Feb-1946, 74 y.o.   MRN: [id number]  HPI  Patient presents for yearly preventative medicine examination. She is a pleasant 74 year old female who  has a past medical history of ALLERGIC RHINITIS, Anxiety, Arthritis, ARTHRITIS, TRAUMATIC, ANKLE, Cataract, Colitis, Depression, Dyslipidemia, EXOGENOUS OBESITY, GERD (gastroesophageal reflux disease), Hemorrhoids, Hyperglycemia, and Melanoma in situ of left lower leg ([place]).  Hyperlipidemia - takes lipitor 20 mg and ASA 81 mg daily. She denies myalgia or fatigue  Lab Results  Component Value Date   CHOL 127 07/28/2018   HDL 53.30 07/28/2018   LDLCALC 40 07/28/2018   LDLDIRECT 130.6 12/18/2008   TRIG 167.0 (H) 07/28/2018   CHOLHDL 2 07/28/2018    Essential Hypertension - Controlled with Cozaar 25 mg daily.  She denies dizziness, lightheadedness, chest pain, or shortness of breath BP Readings from Last 3 Encounters:  10/21/19 114/72  07/28/18 130/86  03/18/18 110/64   Anxiety and Depression -is currently prescribed Wellbutrin 300 mg and Xanax 1mg  BID PRN.   All immunizations and health maintenance protocols were reviewed with the patient and needed orders were placed. She is up to date on routine vaccinations.   Appropriate screening laboratory values were ordered for the patient including screening of hyperlipidemia, renal function and hepatic function.  Medication reconciliation,  past medical history, social history, problem list and allergies were reviewed in detail with the patient  Goals were established with regard to weight loss, exercise, and  diet in compliance with medications. She has been pretty sedentary during the pandemic and has not been watching her diet.   Wt Readings from Last 3 Encounters:  10/21/19 201 lb (91.2 kg)  07/28/18 200 lb (90.7 kg)  03/18/18 200 lb (90.7 kg)   End of life planning was discussed.  Review of Systems  Constitutional:  Negative.   HENT: Negative.   Eyes: Negative.   Respiratory: Negative.   Cardiovascular: Negative.   Gastrointestinal: Negative.   Endocrine: Negative.   Genitourinary: Negative.   Musculoskeletal: Positive for arthralgias (chronic ) and back pain (chronic).  Skin: Negative.   Allergic/Immunologic: Negative.   Neurological: Negative.   Hematological: Negative.   Psychiatric/Behavioral: Positive for sleep disturbance (chronic ).   Past Medical History:  Diagnosis Date  . ALLERGIC RHINITIS   . Anxiety    chronic BZs  . Arthritis   . ARTHRITIS, TRAUMATIC, ANKLE   . Cataract   . Colitis   . Depression   . Dyslipidemia   . EXOGENOUS OBESITY   . GERD (gastroesophageal reflux disease)   . Hemorrhoids   . Hyperglycemia   . Melanoma in situ of left lower leg ([place])     Social History   Socioeconomic History  . Marital status: Widowed    Spouse name: Not on file  . Number of children: 2  . Years of education: Not on file  . Highest education level: Not on file  Occupational History  . Occupation: retired  Tobacco Use  . Smoking status: Never Smoker  . Smokeless tobacco: Never Used  Substance and Sexual Activity  . Alcohol use: Yes    Comment: social  . Drug use: No  . Sexual activity: Not on file  Other Topics Concern  . Not on file  Social History Narrative   Widowed (spouse - Ron of >44years prior to death)   widowed 23-Apr-2011 - 2 grown supportive dtrs, 5 g-kis (3 are triplets)  Social Determinants of Health   Financial Resource Strain:   . Difficulty of Paying Living Expenses:   Food Insecurity:   . Worried About Charity fundraiser in the Last Year:   . Arboriculturist in the Last Year:   Transportation Needs:   . Film/video editor (Medical):   Marland Kitchen Lack of Transportation (Non-Medical):   Physical Activity:   . Days of Exercise per Week:   . Minutes of Exercise per Session:   Stress:   . Feeling of Stress :   Social Connections:   . Frequency of  Communication with Friends and Family:   . Frequency of Social Gatherings with Friends and Family:   . Attends Religious Services:   . Active Member of Clubs or Organizations:   . Attends Archivist Meetings:   Marland Kitchen Marital Status:   Intimate Partner Violence:   . Fear of Current or Ex-Partner:   . Emotionally Abused:   Marland Kitchen Physically Abused:   . Sexually Abused:     Past Surgical History:  Procedure Laterality Date  . ABDOMINAL HYSTERECTOMY  10/1978  . ANKLE ARTHROSCOPY Right 10/19/12   deorio  . arthroscopy left knee  05/2002, 04/2010  . BREAST SURGERY  11/1988   Benign mass (R) breast  . C. surgery history and scan sheet    . Cataract surgery     (L) eye 09/06/12 & (R) eye 10/11/12  . CHOLECYSTECTOMY  09/2004  . cysto sling  10/2006   3 times  . FRACTURE SURGERY     right ankle  . JOINT REPLACEMENT  06/2010   R ankle  . MASS EXCISION     under right arm  . removal multiple cyst breast left and right    . sling anterior repair  12/1999  . SPINE SURGERY     laminotomy with decompression  . Toe Exostosis  10/08   Bil  . TOTAL ANKLE REPLACEMENT Right     Family History  Problem Relation Age of Onset  . Hyperlipidemia Father   . Coronary artery disease Father        cabg 70yo  . Leukemia Father   . Hypertension Father   . Heart attack Brother 60       stents x 2 (65 & 18 yo)  . Diabetes Brother   . Anal fissures Brother   . Diabetes Mother   . Hyperlipidemia Mother   . Hypertension Mother   . Colon cancer Neg Hx   . Esophageal cancer Neg Hx     Allergies  Allergen Reactions  . Hydrocodone-Acetaminophen     REACTION: hives, itiching  . Latex     Causes redness and itching  . Lisinopril Cough  . Oxycodone-Acetaminophen Itching    REACTION: itching    Current Outpatient Medications on File Prior to Visit  Medication Sig Dispense Refill  . acetaminophen (TYLENOL) 500 MG tablet Take 2 tablets (1,000 mg total) by mouth every 6 (six) hours as needed. 30  tablet 0  . ALPRAZolam (XANAX) 1 MG tablet TAKE 1 TABLET(1 MG) BY MOUTH TWICE DAILY AS NEEDED FOR ANXIETY 60 tablet 2  . aspirin 81 MG tablet Take 81 mg by mouth daily.      Marland Kitchen atorvastatin (LIPITOR) 20 MG tablet Take 1 tablet (20 mg total) by mouth daily. 90 tablet 0  . Biotin 2500 MCG CAPS Take by mouth daily.    Marland Kitchen buPROPion (WELLBUTRIN XL) 300 MG 24 hr tablet Take 1  tablet (300 mg total) by mouth daily. 90 tablet 0  . Calcium Carbonate-Vitamin D (CALTRATE 600+D) 600-400 MG-UNIT per tablet Take 1 tablet by mouth daily.      Marland Kitchen losartan (COZAAR) 25 MG tablet Take 1 tablet (25 mg total) by mouth daily. 90 tablet 0  . Multiple Vitamin (MULTIVITAMIN) tablet Take 1 tablet by mouth daily.      . Omega-3 Fatty Acids (FISH OIL) 1000 MG CAPS Take by mouth 2 (two) times daily.    Marland Kitchen RA ALLERGY RELIEF 180 MG tablet take 1 tablet by mouth once daily 30 tablet 5  . vitamin B-12 (CYANOCOBALAMIN) 1000 MCG tablet Take 1,000 mcg by mouth daily.     No current facility-administered medications on file prior to visit.    BP 114/72   Temp 97.8 F (36.6 C)   Ht 5' 7.5" (1.715 m)   Wt 201 lb (91.2 kg)   BMI 31.02 kg/m       Objective:   Physical Exam Vitals and nursing note reviewed.  Constitutional:      General: She is not in acute distress.    Appearance: Normal appearance. She is well-developed. She is not ill-appearing.  HENT:     Head: Normocephalic and atraumatic.     Right Ear: Tympanic membrane, ear canal and external ear normal. There is no impacted cerumen.     Left Ear: Tympanic membrane, ear canal and external ear normal. There is no impacted cerumen.     Nose: Nose normal. No congestion or rhinorrhea.     Mouth/Throat:     Mouth: Mucous membranes are moist.     Pharynx: Oropharynx is clear. No oropharyngeal exudate or posterior oropharyngeal erythema.  Eyes:     General:        Right eye: No discharge.        Left eye: No discharge.     Extraocular Movements: Extraocular movements  intact.     Conjunctiva/sclera: Conjunctivae normal.     Pupils: Pupils are equal, round, and reactive to light.  Neck:     Thyroid: No thyromegaly.     Vascular: No carotid bruit.     Trachea: No tracheal deviation.  Cardiovascular:     Rate and Rhythm: Normal rate and regular rhythm.     Pulses: Normal pulses.     Heart sounds: Normal heart sounds. No murmur. No friction rub. No gallop.   Pulmonary:     Effort: Pulmonary effort is normal. No respiratory distress.     Breath sounds: Normal breath sounds. No stridor. No wheezing, rhonchi or rales.  Chest:     Chest wall: No tenderness.  Abdominal:     General: Abdomen is flat. Bowel sounds are normal. There is no distension.     Palpations: Abdomen is soft. There is no mass.     Tenderness: There is no abdominal tenderness. There is no right CVA tenderness, left CVA tenderness, guarding or rebound.     Hernia: No hernia is present.  Musculoskeletal:        General: No swelling, tenderness, deformity or signs of injury. Normal range of motion.     Cervical back: Normal range of motion and neck supple.     Right lower leg: No edema.     Left lower leg: No edema.  Lymphadenopathy:     Cervical: No cervical adenopathy.  Skin:    General: Skin is warm and dry.     Coloration: Skin is not jaundiced or pale.  Findings: No bruising, erythema, lesion or rash.  Neurological:     General: No focal deficit present.     Mental Status: She is alert and oriented to person, place, and time.     Cranial Nerves: No cranial nerve deficit.     Sensory: No sensory deficit.     Motor: No weakness.     Coordination: Coordination normal.     Gait: Gait normal.     Deep Tendon Reflexes: Reflexes normal.  Psychiatric:        Mood and Affect: Mood normal.        Behavior: Behavior normal.        Thought Content: Thought content normal.        Judgment: Judgment normal.       Assessment & Plan:  1. Routine general medical examination at a  health care facility - I would like her to be more active and start eating healthier.  - Follow up in one year or sooner if needed - CBC with Differential/Platelet - Comprehensive metabolic panel - Lipid panel - TSH  2. Essential hypertension - Well controlled. No change in medications  - CBC with Differential/Platelet - Comprehensive metabolic panel - Lipid panel - TSH  3. Dyslipidemia - Consider increase in statin  - CBC with Differential/Platelet - Comprehensive metabolic panel - Lipid panel - TSH  4. ANXIETY DEPRESSION - Continue with Wellbutrin and Xanax - CBC with Differential/Platelet - Comprehensive metabolic panel - Lipid panel - TSH   Dorothyann Peng, NP

## 2019-10-24 ENCOUNTER — Other Ambulatory Visit: Payer: Self-pay

## 2019-10-24 ENCOUNTER — Encounter: Payer: Self-pay | Admitting: Family Medicine

## 2019-10-24 ENCOUNTER — Ambulatory Visit (INDEPENDENT_AMBULATORY_CARE_PROVIDER_SITE_OTHER): Payer: PPO | Admitting: Family Medicine

## 2019-10-24 VITALS — BP 130/60 | HR 87 | Temp 97.3°F

## 2019-10-24 DIAGNOSIS — H60501 Unspecified acute noninfective otitis externa, right ear: Secondary | ICD-10-CM | POA: Diagnosis not present

## 2019-10-24 MED ORDER — CIPROFLOXACIN-DEXAMETHASONE 0.3-0.1 % OT SUSP: 4.0000 [drp] | Freq: Two times a day (BID) | OTIC | 0 refills | Status: DC

## 2019-10-24 NOTE — Progress Notes (Signed)
   Subjective:    Patient ID: Julie [name], female    DOB: 1945-12-26, 74 y.o.   MRN: [id number]  HPI Here for pain and decreased hearing in the right ear. She was here last Friday for a well exam, and some cerumen was found in the right ear canal. This was removed with a speculum. Shortly after that she noticed some bleeding from the ear. She has had some bleeding off and on over the weekend. Now this morning her hearing is muffled and she has some mild pain in the ear.    Review of Systems  Constitutional: Negative.   HENT: Positive for ear pain and hearing loss. Negative for congestion and sinus pressure.   Eyes: Negative.   Respiratory: Negative.        Objective:   Physical Exam Constitutional:      Appearance: Normal appearance.  HENT:     Left Ear: Tympanic membrane, ear canal and external ear normal.     Ears:     Comments: The right ear canal is blocked by some swelling of the canal and by some clotted blood. The area is tender. No cervical adenopathy Cardiovascular:     Rate and Rhythm: Normal rate and regular rhythm.     Pulses: Normal pulses.     Heart sounds: Normal heart sounds.  Pulmonary:     Effort: Pulmonary effort is normal.     Breath sounds: Normal breath sounds.  Neurological:     Mental Status: She is alert.           Assessment & Plan:  She had some bleeding in the canal from trauma and this appears to have stopped, but she ihas also developed some otitis externa. Treat with Ciprodex drops for several days. Avoid putting anything in the canal. Recheck as needed.  Alysia Penna, MD

## 2019-10-26 ENCOUNTER — Telehealth: Payer: Self-pay | Admitting: Adult Health

## 2019-10-26 DIAGNOSIS — F329 Major depressive disorder, single episode, unspecified: Secondary | ICD-10-CM

## 2019-10-26 DIAGNOSIS — F419 Anxiety disorder, unspecified: Secondary | ICD-10-CM

## 2019-10-26 MED ORDER — ALPRAZOLAM 1 MG PO TABS: ORAL_TABLET | ORAL | 2 refills | Status: DC

## 2019-10-26 NOTE — Telephone Encounter (Signed)
Medication Refill: XANAX Atorvastatin   Pharmacy: [facility] Phone: [phone] FAX: [phone]    Pt states she is out of town and accidentally left these two px at home. She is unable to go home to get them and is wondering if her PCP will call it in at the pharmacy listed. Pt would like a call when its been called in [phone]

## 2019-10-31 ENCOUNTER — Telehealth: Payer: Self-pay | Admitting: Adult Health

## 2019-10-31 NOTE — Telephone Encounter (Signed)
Pt called in about still having ear infection, she said drops that Dr. Sarajane Jews prescribed for ear infection are not working for her. Pt wanted to know can he prescribe an antibiotic or would she need to come in again. She has not been able to hear for 10 days.    Tommi Rumps pt but wanted message sent to Dr Sarajane Jews since he saw her and prescribed mediation for ear infection.

## 2019-11-01 NOTE — Telephone Encounter (Signed)
I would need to see her 

## 2019-11-01 NOTE — Telephone Encounter (Signed)
Please schedule app for pt.  Thank You

## 2019-11-02 ENCOUNTER — Other Ambulatory Visit: Payer: Self-pay

## 2019-11-03 ENCOUNTER — Ambulatory Visit (INDEPENDENT_AMBULATORY_CARE_PROVIDER_SITE_OTHER): Payer: PPO | Admitting: Adult Health

## 2019-11-03 ENCOUNTER — Encounter: Payer: Self-pay | Admitting: Adult Health

## 2019-11-03 VITALS — HR 93 | Temp 96.3°F

## 2019-11-03 DIAGNOSIS — E785 Hyperlipidemia, unspecified: Secondary | ICD-10-CM | POA: Diagnosis not present

## 2019-11-03 DIAGNOSIS — H6121 Impacted cerumen, right ear: Secondary | ICD-10-CM | POA: Diagnosis not present

## 2019-11-03 DIAGNOSIS — F329 Major depressive disorder, single episode, unspecified: Secondary | ICD-10-CM | POA: Diagnosis not present

## 2019-11-03 DIAGNOSIS — I10 Essential (primary) hypertension: Secondary | ICD-10-CM

## 2019-11-03 DIAGNOSIS — F419 Anxiety disorder, unspecified: Secondary | ICD-10-CM | POA: Diagnosis not present

## 2019-11-03 DIAGNOSIS — F32A Depression, unspecified: Secondary | ICD-10-CM

## 2019-11-03 MED ORDER — LOSARTAN POTASSIUM 25 MG PO TABS: 25.0000 mg | ORAL_TABLET | Freq: Every day | ORAL | 3 refills | Status: DC

## 2019-11-03 MED ORDER — ATORVASTATIN CALCIUM 20 MG PO TABS: 20.0000 mg | ORAL_TABLET | Freq: Every day | ORAL | 3 refills | Status: DC

## 2019-11-03 MED ORDER — BUPROPION HCL ER (XL) 300 MG PO TB24: 300.0000 mg | ORAL_TABLET | Freq: Every day | ORAL | 1 refills | Status: DC

## 2019-11-03 NOTE — Progress Notes (Signed)
Subjective:    Patient ID: Julie [name], female    DOB: 28-Jan-1946, 74 y.o.   MRN: [id number]  HPI 74 year old female who  has a past medical history of ALLERGIC RHINITIS, Anxiety, Arthritis, ARTHRITIS, TRAUMATIC, ANKLE, Cataract, Colitis, Depression, Dyslipidemia, EXOGENOUS OBESITY, GERD (gastroesophageal reflux disease), Hemorrhoids, Hyperglycemia, and Melanoma in situ of left lower leg ([place]).  During her CPE a few weeks ago some mild cerumen impaction was removed from her right ear, shortly after she noticed some bleeding from the ear.  She followed up in the office with another provider on 10/24/2019 for decreased hearing as well as pain in the ear.  She was prescribed an antibiotic eardrop for otitis externa.  She reports that she has been using these drops but she continues to have decreased hearing in her right ear.  Additionally she needs her chronic disease medications refilled    Review of Systems See HPI   Past Medical History:  Diagnosis Date  . ALLERGIC RHINITIS   . Anxiety    chronic BZs  . Arthritis   . ARTHRITIS, TRAUMATIC, ANKLE   . Cataract   . Colitis   . Depression   . Dyslipidemia   . EXOGENOUS OBESITY   . GERD (gastroesophageal reflux disease)   . Hemorrhoids   . Hyperglycemia   . Melanoma in situ of left lower leg ([place])     Social History   Socioeconomic History  . Marital status: Widowed    Spouse name: Not on file  . Number of children: 2  . Years of education: Not on file  . Highest education level: Not on file  Occupational History  . Occupation: retired  Tobacco Use  . Smoking status: Never Smoker  . Smokeless tobacco: Never Used  Substance and Sexual Activity  . Alcohol use: Yes    Comment: social  . Drug use: No  . Sexual activity: Not on file  Other Topics Concern  . Not on file  Social History Narrative   Widowed (spouse - Ron of >44years prior to death)   widowed 2011/04/22 - 2 grown supportive dtrs, 5 g-kis (3 are triplets)     Social Determinants of Radio broadcast assistant Strain:   . Difficulty of Paying Living Expenses:   Food Insecurity:   . Worried About Charity fundraiser in the Last Year:   . Arboriculturist in the Last Year:   Transportation Needs:   . Film/video editor (Medical):   Marland Kitchen Lack of Transportation (Non-Medical):   Physical Activity:   . Days of Exercise per Week:   . Minutes of Exercise per Session:   Stress:   . Feeling of Stress :   Social Connections:   . Frequency of Communication with Friends and Family:   . Frequency of Social Gatherings with Friends and Family:   . Attends Religious Services:   . Active Member of Clubs or Organizations:   . Attends Archivist Meetings:   Marland Kitchen Marital Status:   Intimate Partner Violence:   . Fear of Current or Ex-Partner:   . Emotionally Abused:   Marland Kitchen Physically Abused:   . Sexually Abused:     Past Surgical History:  Procedure Laterality Date  . ABDOMINAL HYSTERECTOMY  10/1978  . ANKLE ARTHROSCOPY Right 10/19/12   deorio  . arthroscopy left knee  05/2002, 04/2010  . BREAST SURGERY  11/1988   Benign mass (R) breast  . C. surgery history and  scan sheet    . Cataract surgery     (L) eye 09/06/12 & (R) eye 10/11/12  . CHOLECYSTECTOMY  09/2004  . cysto sling  10/2006   3 times  . FRACTURE SURGERY     right ankle  . JOINT REPLACEMENT  06/2010   R ankle  . MASS EXCISION     under right arm  . removal multiple cyst breast left and right    . sling anterior repair  12/1999  . SPINE SURGERY     laminotomy with decompression  . Toe Exostosis  10/08   Bil  . TOTAL ANKLE REPLACEMENT Right     Family History  Problem Relation Age of Onset  . Hyperlipidemia Father   . Coronary artery disease Father        cabg 70yo  . Leukemia Father   . Hypertension Father   . Heart attack Brother 57       stents x 2 (60 & 34 yo)  . Diabetes Brother   . Anal fissures Brother   . Diabetes Mother   . Hyperlipidemia Mother   .  Hypertension Mother   . Colon cancer Neg Hx   . Esophageal cancer Neg Hx     Allergies  Allergen Reactions  . Hydrocodone-Acetaminophen     REACTION: hives, itiching  . Latex     Causes redness and itching  . Lisinopril Cough  . Oxycodone-Acetaminophen Itching    REACTION: itching    Current Outpatient Medications on File Prior to Visit  Medication Sig Dispense Refill  . acetaminophen (TYLENOL) 500 MG tablet Take 2 tablets (1,000 mg total) by mouth every 6 (six) hours as needed. 30 tablet 0  . ALPRAZolam (XANAX) 1 MG tablet TAKE 1 TABLET(1 MG) BY MOUTH TWICE DAILY AS NEEDED FOR ANXIETY 60 tablet 2  . aspirin 81 MG tablet Take 81 mg by mouth daily.      Marland Kitchen atorvastatin (LIPITOR) 20 MG tablet Take 1 tablet (20 mg total) by mouth daily. 90 tablet 0  . Biotin 2500 MCG CAPS Take by mouth daily.    Marland Kitchen buPROPion (WELLBUTRIN XL) 300 MG 24 hr tablet Take 1 tablet (300 mg total) by mouth daily. 90 tablet 0  . Calcium Carbonate-Vitamin D (CALTRATE 600+D) 600-400 MG-UNIT per tablet Take 1 tablet by mouth daily.      . ciprofloxacin-dexamethasone (CIPRODEX) OTIC suspension Place 4 drops into the right ear 2 (two) times daily. 7.5 mL 0  . losartan (COZAAR) 25 MG tablet Take 1 tablet (25 mg total) by mouth daily. 90 tablet 0  . Multiple Vitamin (MULTIVITAMIN) tablet Take 1 tablet by mouth daily.      . Omega-3 Fatty Acids (FISH OIL) 1000 MG CAPS Take by mouth 2 (two) times daily.    Marland Kitchen RA ALLERGY RELIEF 180 MG tablet take 1 tablet by mouth once daily 30 tablet 5  . vitamin B-12 (CYANOCOBALAMIN) 1000 MCG tablet Take 1,000 mcg by mouth daily.     No current facility-administered medications on file prior to visit.    Pulse 93   Temp (!) 96.3 F (35.7 C) (Temporal)       Objective:   Physical Exam Vitals and nursing note reviewed.  HENT:     Right Ear: Tympanic membrane normal.     Left Ear: Tympanic membrane, ear canal and external ear normal. There is no impacted cerumen.     Ears:      Comments: Still has some cerumen sitting on  the right tympanic membrane Pulmonary:     Effort: Pulmonary effort is normal.     Breath sounds: Normal breath sounds.  Neurological:     General: No focal deficit present.     Mental Status: She is oriented to person, place, and time.  Psychiatric:        Mood and Affect: Mood normal.        Behavior: Behavior normal.        Thought Content: Thought content normal.        Judgment: Judgment normal.       Assessment & Plan:  1. Impacted cerumen of right ear Warm water was applied and gentle ear lavage performed to the right ear There were no complications and following the disimpaction the tympanic membrane were visible . Tympanic membrane is intact following the procedure.  Auditory canals are normal and there were no signs of otitis externa. Patient tolerated procedure well.  He did notice some improvement in her hearing but still felt as though the slightly decreased.  We will have her use Afrin for the next 3 days.  Consider referral to audiology if it does not improve  2. Dyslipidemia  - atorvastatin (LIPITOR) 20 MG tablet; Take 1 tablet (20 mg total) by mouth daily.  Dispense: 90 tablet; Refill: 0  3. Anxiety and depression  - buPROPion (WELLBUTRIN XL) 300 MG 24 hr tablet; Take 1 tablet (300 mg total) by mouth daily.  Dispense: 90 tablet; Refill: 0  4. Essential hypertension  - losartan (COZAAR) 25 MG tablet; Take 1 tablet (25 mg total) by mouth daily.  Dispense: 90 tablet; Refill: 0   Dorothyann Peng, NP

## 2019-11-04 NOTE — Telephone Encounter (Signed)
Pt seen in [place] on 11/03/19.  Nothing further needed.

## 2019-12-08 ENCOUNTER — Encounter: Payer: Self-pay | Admitting: Adult Health

## 2019-12-08 NOTE — Telephone Encounter (Signed)
Spoke to pt and tried to get her set up with another provider for tomorrow pt declined and stated she is going out of town in the a.m. Tried to have pt drop off urine to do a VV pt declined and stated that she cannot come by the office before lab closes bc she is getting a pedicure. Pt advised to go to local UC to have this looked at but pt declined and stated she will get something OTC and call back Tues when she comes back if needed.

## 2019-12-11 DIAGNOSIS — N3000 Acute cystitis without hematuria: Secondary | ICD-10-CM | POA: Diagnosis not present

## 2019-12-11 DIAGNOSIS — R300 Dysuria: Secondary | ICD-10-CM | POA: Diagnosis not present

## 2020-01-02 ENCOUNTER — Other Ambulatory Visit: Payer: Self-pay | Admitting: Adult Health

## 2020-01-02 DIAGNOSIS — F419 Anxiety disorder, unspecified: Secondary | ICD-10-CM

## 2020-03-07 ENCOUNTER — Other Ambulatory Visit: Payer: Self-pay

## 2020-03-07 ENCOUNTER — Ambulatory Visit (INDEPENDENT_AMBULATORY_CARE_PROVIDER_SITE_OTHER): Payer: PPO

## 2020-03-07 DIAGNOSIS — Z0000 Encounter for general adult medical examination without abnormal findings: Secondary | ICD-10-CM | POA: Diagnosis not present

## 2020-03-07 NOTE — Patient Instructions (Signed)
Julie [name] , Thank you for taking time to come for your Medicare Wellness Visit. I appreciate your ongoing commitment to your health goals. Please review the following plan we discussed and let me know if I can assist you in the future.   Screening recommendations/referrals: Colonoscopy: Up to date, next due 06/23/2025 Mammogram: Up to date, next due 05/29/2020 Bone Density: No longer required Recommended yearly ophthalmology/optometry visit for glaucoma screening and checkup Recommended yearly dental visit for hygiene and checkup  Vaccinations: Influenza vaccine: Up to date, next due this flu season 2021 Pneumococcal vaccine: Completed series Tdap vaccine: Currently due, please contact your insurance company to discuss cost or await and injury to receive  Shingles vaccine: Currently due for Shingrix, please contact your pharmacy to discuss cost and to receive the vaccine     Advanced directives: Please bring copies of your advanced directives in so that we may scan into your chart  Conditions/risks identified: Please try to incorporate at least 30 minutes of physical activity into your daily routine at least 3 days per week.  Next appointment: None   Preventive Care 65 Years and Older, Female Preventive care refers to lifestyle choices and visits with your health care provider that can promote health and wellness. What does preventive care include?  A yearly physical exam. This is also called an annual well check.  Dental exams once or twice a year.  Routine eye exams. Ask your health care provider how often you should have your eyes checked.  Personal lifestyle choices, including:  Daily care of your teeth and gums.  Regular physical activity.  Eating a healthy diet.  Avoiding tobacco and drug use.  Limiting alcohol use.  Practicing safe sex.  Taking low-dose aspirin every day.  Taking vitamin and mineral supplements as recommended by your health care  provider. What happens during an annual well check? The services and screenings done by your health care provider during your annual well check will depend on your age, overall health, lifestyle risk factors, and family history of disease. Counseling  Your health care provider may ask you questions about your:  Alcohol use.  Tobacco use.  Drug use.  Emotional well-being.  Home and relationship well-being.  Sexual activity.  Eating habits.  History of falls.  Memory and ability to understand (cognition).  Work and work Statistician.  Reproductive health. Screening  You may have the following tests or measurements:  Height, weight, and BMI.  Blood pressure.  Lipid and cholesterol levels. These may be checked every 5 years, or more frequently if you are over 47 years old.  Skin check.  Lung cancer screening. You may have this screening every year starting at age 33 if you have a 30-pack-year history of smoking and currently smoke or have quit within the past 15 years.  Fecal occult blood test (FOBT) of the stool. You may have this test every year starting at age 28.  Flexible sigmoidoscopy or colonoscopy. You may have a sigmoidoscopy every 5 years or a colonoscopy every 10 years starting at age 54.  Hepatitis C blood test.  Hepatitis B blood test.  Sexually transmitted disease (STD) testing.  Diabetes screening. This is done by checking your blood sugar (glucose) after you have not eaten for a while (fasting). You may have this done every 1-3 years.  Bone density scan. This is done to screen for osteoporosis. You may have this done starting at age 18.  Mammogram. This may be done every 1-2 years. Talk  to your health care provider about how often you should have regular mammograms. Talk with your health care provider about your test results, treatment options, and if necessary, the need for more tests. Vaccines  Your health care provider may recommend certain  vaccines, such as:  Influenza vaccine. This is recommended every year.  Tetanus, diphtheria, and acellular pertussis (Tdap, Td) vaccine. You may need a Td booster every 10 years.  Zoster vaccine. You may need this after age 66.  Pneumococcal 13-valent conjugate (PCV13) vaccine. One dose is recommended after age 71.  Pneumococcal polysaccharide (PPSV23) vaccine. One dose is recommended after age 56. Talk to your health care provider about which screenings and vaccines you need and how often you need them. This information is not intended to replace advice given to you by your health care provider. Make sure you discuss any questions you have with your health care provider. Document Released: 07/27/2015 Document Revised: 03/19/2016 Document Reviewed: 05/01/2015 Elsevier Interactive Patient Education  2017 [place] Prevention in the Home Falls can cause injuries. They can happen to people of all ages. There are many things you can do to make your home safe and to help prevent falls. What can I do on the outside of my home?  Regularly fix the edges of walkways and driveways and fix any cracks.  Remove anything that might make you trip as you walk through a door, such as a raised step or threshold.  Trim any bushes or trees on the path to your home.  Use bright outdoor lighting.  Clear any walking paths of anything that might make someone trip, such as rocks or tools.  Regularly check to see if handrails are loose or broken. Make sure that both sides of any steps have handrails.  Any raised decks and porches should have guardrails on the edges.  Have any leaves, snow, or ice cleared regularly.  Use sand or salt on walking paths during winter.  Clean up any spills in your garage right away. This includes oil or grease spills. What can I do in the bathroom?  Use night lights.  Install grab bars by the toilet and in the tub and shower. Do not use towel bars as grab  bars.  Use non-skid mats or decals in the tub or shower.  If you need to sit down in the shower, use a plastic, non-slip stool.  Keep the floor dry. Clean up any water that spills on the floor as soon as it happens.  Remove soap buildup in the tub or shower regularly.  Attach bath mats securely with double-sided non-slip rug tape.  Do not have throw rugs and other things on the floor that can make you trip. What can I do in the bedroom?  Use night lights.  Make sure that you have a light by your bed that is easy to reach.  Do not use any sheets or blankets that are too big for your bed. They should not hang down onto the floor.  Have a firm chair that has side arms. You can use this for support while you get dressed.  Do not have throw rugs and other things on the floor that can make you trip. What can I do in the kitchen?  Clean up any spills right away.  Avoid walking on wet floors.  Keep items that you use a lot in easy-to-reach places.  If you need to reach something above you, use a strong step stool that  has a grab bar.  Keep electrical cords out of the way.  Do not use floor polish or wax that makes floors slippery. If you must use wax, use non-skid floor wax.  Do not have throw rugs and other things on the floor that can make you trip. What can I do with my stairs?  Do not leave any items on the stairs.  Make sure that there are handrails on both sides of the stairs and use them. Fix handrails that are broken or loose. Make sure that handrails are as long as the stairways.  Check any carpeting to make sure that it is firmly attached to the stairs. Fix any carpet that is loose or worn.  Avoid having throw rugs at the top or bottom of the stairs. If you do have throw rugs, attach them to the floor with carpet tape.  Make sure that you have a light switch at the top of the stairs and the bottom of the stairs. If you do not have them, ask someone to add them for  you. What else can I do to help prevent falls?  Wear shoes that:  Do not have high heels.  Have rubber bottoms.  Are comfortable and fit you well.  Are closed at the toe. Do not wear sandals.  If you use a stepladder:  Make sure that it is fully opened. Do not climb a closed stepladder.  Make sure that both sides of the stepladder are locked into place.  Ask someone to hold it for you, if possible.  Clearly mark and make sure that you can see:  Any grab bars or handrails.  First and last steps.  Where the edge of each step is.  Use tools that help you move around (mobility aids) if they are needed. These include:  Canes.  Walkers.  Scooters.  Crutches.  Turn on the lights when you go into a dark area. Replace any light bulbs as soon as they burn out.  Set up your furniture so you have a clear path. Avoid moving your furniture around.  If any of your floors are uneven, fix them.  If there are any pets around you, be aware of where they are.  Review your medicines with your doctor. Some medicines can make you feel dizzy. This can increase your chance of falling. Ask your doctor what other things that you can do to help prevent falls. This information is not intended to replace advice given to you by your health care provider. Make sure you discuss any questions you have with your health care provider. Document Released: 04/26/2009 Document Revised: 12/06/2015 Document Reviewed: 08/04/2014 Elsevier Interactive Patient Education  2017 [employer].

## 2020-03-07 NOTE — Progress Notes (Signed)
Subjective:   Julie [name] is a 74 y.o. female who presents for Medicare Annual (Subsequent) preventive examination.   I connected with [name] today by telephone and verified that I am speaking with the correct person using two identifiers. Location patient: home Location provider: work Persons participating in the virtual visit: patient, provider.   I discussed the limitations, risks, security and privacy concerns of performing an evaluation and management service by telephone and the availability of in person appointments. I also discussed with the patient that there may be a patient responsible charge related to this service. The patient expressed understanding and verbally consented to this telephonic visit.    Interactive audio and video telecommunications were attempted between this provider and patient, however failed, due to patient having technical difficulties OR patient did not have access to video capability.  We continued and completed visit with audio only.     Review of Systems    N/A Cardiac Risk Factors include: advanced age (>78men, >61 women);dyslipidemia;hypertension     Objective:    Today's Vitals   There is no height or weight on file to calculate BMI.  Advanced Directives 03/07/2020 10/11/2016 04/10/2016 06/25/2015  Does Patient Have a Medical Advance Directive? No No No No  Would patient like information on creating a medical advance directive? No - Patient declined No - Patient declined (No Data) -    Current Medications (verified) Outpatient Encounter Medications as of 03/07/2020  Medication Sig  . acetaminophen (TYLENOL) 500 MG tablet Take 2 tablets (1,000 mg total) by mouth every 6 (six) hours as needed.  . ALPRAZolam (XANAX) 1 MG tablet TAKE 1 TABLET(1 MG) BY MOUTH TWICE DAILY AS NEEDED FOR ANXIETY  . aspirin 81 MG tablet Take 81 mg by mouth daily.    Marland Kitchen atorvastatin (LIPITOR) 20 MG tablet Take 1 tablet (20 mg total) by mouth daily.  .  Biotin 2500 MCG CAPS Take by mouth daily.  Marland Kitchen buPROPion (WELLBUTRIN XL) 300 MG 24 hr tablet Take 1 tablet (300 mg total) by mouth daily.  . Calcium Carbonate-Vitamin D (CALTRATE 600+D) 600-400 MG-UNIT per tablet Take 1 tablet by mouth daily.    Marland Kitchen losartan (COZAAR) 25 MG tablet Take 1 tablet (25 mg total) by mouth daily.  . Multiple Vitamin (MULTIVITAMIN) tablet Take 1 tablet by mouth daily.    Marland Kitchen RA ALLERGY RELIEF 180 MG tablet take 1 tablet by mouth once daily  . vitamin B-12 (CYANOCOBALAMIN) 1000 MCG tablet Take 1,000 mcg by mouth daily.  . ciprofloxacin-dexamethasone (CIPRODEX) OTIC suspension Place 4 drops into the right ear 2 (two) times daily. (Patient not taking: Reported on 03/07/2020)  . Omega-3 Fatty Acids (FISH OIL) 1000 MG CAPS Take by mouth 2 (two) times daily. (Patient not taking: Reported on 03/07/2020)   No facility-administered encounter medications on file as of 03/07/2020.    Allergies (verified) Hydrocodone-acetaminophen, Latex, Lisinopril, and Oxycodone-acetaminophen   History: Past Medical History:  Diagnosis Date  . ALLERGIC RHINITIS   . Anxiety    chronic BZs  . Arthritis   . ARTHRITIS, TRAUMATIC, ANKLE   . Cataract   . Colitis   . Depression   . Dyslipidemia   . EXOGENOUS OBESITY   . GERD (gastroesophageal reflux disease)   . Hemorrhoids   . Hyperglycemia   . Melanoma in situ of left lower leg [facility])    Past Surgical History:  Procedure Laterality Date  . ABDOMINAL HYSTERECTOMY  10/1978  . ANKLE ARTHROSCOPY Right 10/19/12  deorio  . arthroscopy left knee  05/2002, 04/2010  . BREAST SURGERY  11/1988   Benign mass (R) breast  . C. surgery history and scan sheet    . Cataract surgery     (L) eye 09/06/12 & (R) eye 10/11/12  . CHOLECYSTECTOMY  09/2004  . cysto sling  10/2006   3 times  . FRACTURE SURGERY     right ankle  . JOINT REPLACEMENT  06/2010   R ankle  . MASS EXCISION     under right arm  . removal multiple cyst breast left and right    . sling  anterior repair  12/1999  . SPINE SURGERY     laminotomy with decompression  . Toe Exostosis  10/08   Bil  . TOTAL ANKLE REPLACEMENT Right    Family History  Problem Relation Age of Onset  . Hyperlipidemia Father   . Coronary artery disease Father        cabg 70yo  . Leukemia Father   . Hypertension Father   . Heart attack Brother 92       stents x 2 (18 & 29 yo)  . Diabetes Brother   . Anal fissures Brother   . Diabetes Mother   . Hyperlipidemia Mother   . Hypertension Mother   . Colon cancer Neg Hx   . Esophageal cancer Neg Hx    Social History   Socioeconomic History  . Marital status: Widowed    Spouse name: Not on file  . Number of children: 2  . Years of education: Not on file  . Highest education level: Not on file  Occupational History  . Occupation: retired  Tobacco Use  . Smoking status: Never Smoker  . Smokeless tobacco: Never Used  Vaping Use  . Vaping Use: Never used  Substance and Sexual Activity  . Alcohol use: Yes    Comment: social  . Drug use: No  . Sexual activity: Not on file  Other Topics Concern  . Not on file  Social History Narrative   Widowed (spouse - Ron of >44years prior to death)   widowed 05/03/11 - 2 grown supportive dtrs, 5 g-kis (3 are triplets)   Social Determinants of Health   Financial Resource Strain: Low Risk   . Difficulty of Paying Living Expenses: Not hard at all  Food Insecurity: No Food Insecurity  . Worried About Charity fundraiser in the Last Year: Never true  . Ran Out of Food in the Last Year: Never true  Transportation Needs: No Transportation Needs  . Lack of Transportation (Medical): No  . Lack of Transportation (Non-Medical): No  Physical Activity: Inactive  . Days of Exercise per Week: 0 days  . Minutes of Exercise per Session: 0 min  Stress: No Stress Concern Present  . Feeling of Stress : Not at all  Social Connections: Moderately Isolated  . Frequency of Communication with Friends and Family: More  than three times a week  . Frequency of Social Gatherings with Friends and Family: Three times a week  . Attends Religious Services: More than 4 times per year  . Active Member of Clubs or Organizations: No  . Attends Archivist Meetings: Never  . Marital Status: Widowed    Tobacco Counseling Counseling given: Not Answered   Clinical Intake:  Pre-visit preparation completed: Yes  Pain : No/denies pain     Nutritional Risks: None Diabetes: No  How often do you need to have someone help  you when you read instructions, pamphlets, or other written materials from your doctor or pharmacy?: 1 - Never What is the last grade level you completed in school?: 12th Grade  Diabetic?No  Interpreter Needed?: No  Information entered by :: [place] of Daily Living In your present state of health, do you have any difficulty performing the following activities: 03/07/2020  Hearing? N  Vision? N  Difficulty concentrating or making decisions? Y  Comment has and issues sometimes remembering names at times  Walking or climbing stairs? N  Dressing or bathing? N  Doing errands, shopping? N  Preparing Food and eating ? N  Using the Toilet? N  In the past six months, have you accidently leaked urine? Y  Comment Has occassional leakage  Do you have problems with loss of bowel control? N  Managing your Medications? N  Managing your Finances? N  Housekeeping or managing your Housekeeping? N  Some recent data might be hidden    Patient Care Team: Dorothyann Peng, NP as PCP - General (Family Medicine) Louretta Shorten, MD (Obstetrics and Gynecology) Earlie Server, MD (Orthopedic Surgery) Deorio, Sharion Dove, MD (Orthopedic Surgery)  Indicate any recent Medical Services you may have received from other than [facility] providers in the past year (date may be approximate).     Assessment:   This is a routine wellness examination for Lachell.  Hearing/Vision screen  Hearing  Screening   125Hz  250Hz  500Hz  1000Hz  2000Hz  3000Hz  4000Hz  6000Hz  8000Hz   Right ear:           Left ear:           Vision Screening Comments: Patient states gets annual eye exams   Dietary issues and exercise activities discussed: Current Exercise Habits: The patient does not participate in regular exercise at present, Exercise limited by: orthopedic condition(s) (Ankle problems)  Goals    . 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 Weight (lb) < 175 lb (79.4 kg)      Depression Screen PHQ 2/9 Scores 03/07/2020 06/29/2017 04/10/2016 07/31/2015 03/14/2015 03/09/2014 02/09/2013  PHQ - 2 Score 2 6 0 0 0 2 2  PHQ- 9 Score 3 - - - - 5 4    Fall Risk Fall Risk  03/07/2020 06/29/2017 04/10/2016 07/31/2015 03/14/2015  Falls in the past year? 0 No No No No  Number falls in past yr: 0 - - - -  Injury with Fall? 0 - - - -  Risk for fall due to : Impaired balance/gait;Medication side effect - - - -  Follow up Falls evaluation completed;Falls prevention discussed - - - -    Any stairs in or around the home? Yes  If so, are there any without handrails? No  Home free of loose throw rugs in walkways, pet beds, electrical cords, etc? Yes  Adequate lighting in your home to reduce risk of falls? Yes   ASSISTIVE DEVICES UTILIZED TO PREVENT FALLS:  Life alert? No  Use of a cane, walker or w/c? No  Grab bars in the bathroom? Yes  Shower chair or bench in shower? Yes  Elevated toilet seat or a handicapped toilet? No     Cognitive Function: MMSE - Mini Mental State Exam 04/10/2016  Not completed: (No Data)        Immunizations Immunization History  Administered Date(s) Administered  . Fluad Quad(high Dose 65+) 04/12/2019  . Influenza Split 05/21/2012  . Influenza Whole  04/12/2009, 04/16/2010, 04/05/2011  . Influenza, High Dose Seasonal PF 04/18/2013, 03/27/2016, 04/22/2018  . Influenza,inj,Quad PF,6+ Mos 03/09/2014  . Influenza-Unspecified 04/26/2015  . PFIZER  SARS-COV-2 Vaccination 08/28/2019, 09/20/2019  . Pneumococcal Conjugate-13 03/14/2015  . Pneumococcal Polysaccharide-23 06/30/2011  . Td 10/16/2005, 04/12/2009  . Zoster 02/03/2012    TDAP status: Due, Education has been provided regarding the importance of this vaccine. Advised may receive this vaccine at local pharmacy or Health Dept. Aware to provide a copy of the vaccination record if obtained from local pharmacy or Health Dept. Verbalized acceptance and understanding. Flu Vaccine status: Up to date Pneumococcal vaccine status: Up to date Covid-19 vaccine status: Completed vaccines  Qualifies for Shingles Vaccine? Yes   Zostavax completed Yes   Shingrix Completed?: No.    Education has been provided regarding the importance of this vaccine. Patient has been advised to call insurance company to determine out of pocket expense if they have not yet received this vaccine. Advised may also receive vaccine at local pharmacy or Health Dept. Verbalized acceptance and understanding.  Screening Tests Health Maintenance  Topic Date Due  . TETANUS/TDAP  04/13/2019  . INFLUENZA VACCINE  02/12/2020  . MAMMOGRAM  05/29/2021  . COLONOSCOPY  06/24/2025  . DEXA SCAN  Completed  . COVID-19 Vaccine  Completed  . Hepatitis C Screening  Completed  . PNA vac Low Risk Adult  Completed    Health Maintenance  Health Maintenance Due  Topic Date Due  . TETANUS/TDAP  04/13/2019  . INFLUENZA VACCINE  02/12/2020    Colorectal cancer screening: Completed 06/25/2015. Repeat every 10 years Mammogram status: Completed 05/30/2019. Repeat every year Bone Density status: Completed 03/13/2015. Results reflect: Bone density results: NORMAL. Repeat every 0 years.  Lung Cancer Screening: (Low Dose CT Chest recommended if Age 16-80 years, 30 pack-year currently smoking OR have quit w/in 15years.) does not qualify.   Lung Cancer Screening Referral: N/A  Additional Screening:  Hepatitis C Screening: does  qualify; Completed 03/21/2016  Vision Screening: Recommended annual ophthalmology exams for early detection of glaucoma and other disorders of the eye. Is the patient up to date with their annual eye exam?  Yes  Who is the provider or what is the name of the office in which the patient attends annual eye exams? Dr. Jerline Pain If pt is not established with a provider, would they like to be referred to a provider to establish care? No .   Dental Screening: Recommended annual dental exams for proper oral hygiene  Community Resource Referral / Chronic Care Management: CRR required this visit?  No   CCM required this visit?  No      Plan:     I have personally reviewed and noted the following in the patient's chart:   . Medical and social history . Use of alcohol, tobacco or illicit drugs  . Current medications and supplements . Functional ability and status . Nutritional status . Physical activity . Advanced directives . List of other physicians . Hospitalizations, surgeries, and ER visits in previous 12 months . Vitals . Screenings to include cognitive, depression, and falls . Referrals and appointments  In addition, I have reviewed and discussed with patient certain preventive protocols, quality metrics, and best practice recommendations. A written personalized care plan for preventive services as well as general preventive health recommendations were provided to patient.     Ofilia Neas, LPN   [id number]/50/[id number]   Nurse Notes: None

## 2020-05-25 DIAGNOSIS — L738 Other specified follicular disorders: Secondary | ICD-10-CM | POA: Diagnosis not present

## 2020-05-25 DIAGNOSIS — D1801 Hemangioma of skin and subcutaneous tissue: Secondary | ICD-10-CM | POA: Diagnosis not present

## 2020-05-25 DIAGNOSIS — D2272 Melanocytic nevi of left lower limb, including hip: Secondary | ICD-10-CM | POA: Diagnosis not present

## 2020-05-25 DIAGNOSIS — L820 Inflamed seborrheic keratosis: Secondary | ICD-10-CM | POA: Diagnosis not present

## 2020-05-25 DIAGNOSIS — L720 Epidermal cyst: Secondary | ICD-10-CM | POA: Diagnosis not present

## 2020-05-25 DIAGNOSIS — Z85820 Personal history of malignant melanoma of skin: Secondary | ICD-10-CM | POA: Diagnosis not present

## 2020-05-25 DIAGNOSIS — L821 Other seborrheic keratosis: Secondary | ICD-10-CM | POA: Diagnosis not present

## 2020-05-25 DIAGNOSIS — B078 Other viral warts: Secondary | ICD-10-CM | POA: Diagnosis not present

## 2020-05-30 ENCOUNTER — Encounter: Payer: Self-pay | Admitting: Adult Health

## 2020-05-30 ENCOUNTER — Other Ambulatory Visit: Payer: Self-pay | Admitting: Adult Health

## 2020-05-30 DIAGNOSIS — Z1231 Encounter for screening mammogram for malignant neoplasm of breast: Secondary | ICD-10-CM | POA: Diagnosis not present

## 2020-05-30 DIAGNOSIS — F419 Anxiety disorder, unspecified: Secondary | ICD-10-CM

## 2020-05-30 DIAGNOSIS — F32A Depression, unspecified: Secondary | ICD-10-CM

## 2020-05-30 LAB — HM MAMMOGRAPHY

## 2020-06-05 ENCOUNTER — Encounter: Payer: Self-pay | Admitting: Adult Health

## 2020-07-03 ENCOUNTER — Other Ambulatory Visit: Payer: Self-pay | Admitting: Adult Health

## 2020-07-03 DIAGNOSIS — F32A Depression, unspecified: Secondary | ICD-10-CM

## 2020-07-20 ENCOUNTER — Telehealth: Payer: Self-pay | Admitting: Adult Health

## 2020-07-20 DIAGNOSIS — R300 Dysuria: Secondary | ICD-10-CM | POA: Diagnosis not present

## 2020-07-20 DIAGNOSIS — N390 Urinary tract infection, site not specified: Secondary | ICD-10-CM | POA: Diagnosis not present

## 2020-07-20 DIAGNOSIS — A499 Bacterial infection, unspecified: Secondary | ICD-10-CM | POA: Diagnosis not present

## 2020-07-20 NOTE — Telephone Encounter (Signed)
Spoke to the pt and informed her of the below message.  She has decided to go to urgent care for treatment.  Nothing further needed at this time.

## 2020-07-20 NOTE — Telephone Encounter (Signed)
Ok to place orders for urinalysis and urine culture. Will have to wait for urine culture to come back before treatment

## 2020-07-20 NOTE — Telephone Encounter (Signed)
Patient is calling and stated that she has a UTI and wanted to see if the provider could put orders in to give a specimen, please advise. CB is [phone]

## 2020-08-10 LAB — HM MAMMOGRAPHY

## 2020-08-28 ENCOUNTER — Other Ambulatory Visit: Payer: Self-pay | Admitting: Adult Health

## 2020-08-28 ENCOUNTER — Telehealth: Payer: Self-pay | Admitting: Adult Health

## 2020-08-28 DIAGNOSIS — E785 Hyperlipidemia, unspecified: Secondary | ICD-10-CM

## 2020-08-29 NOTE — Telephone Encounter (Signed)
FILLED FOR 1 YEAR ON 11/03/2019.  REQUEST IS TOO EARLY.

## 2020-08-29 NOTE — Telephone Encounter (Signed)
Noted.  Will wait on return call from patient.

## 2020-08-29 NOTE — Telephone Encounter (Signed)
SENT TO THE PHARMACY ON 11/03/2019 FOR 1 YEAR.  REQUEST IS TOO EARLY.

## 2020-08-29 NOTE — Telephone Encounter (Signed)
FYI:  Pt called in stating that the pharmacy only gave her 23 pills of her 61 and stated to her that they need to get all of her prescription on the same schedule and would give the remaining to her at the next visit.  Pt went back to get the remaining and they told her that she need to contact her doctor b/c the doctor decline the refill.  Pt is aware that yes it was declined due to a new prescription is not due until 11/02/2020 or after.  Pt stated that she wanted to make sure that we didn't need to do anything and she will call the pharmacy and check on getting her remaining 67 pills.  Pt is aware that it is the pharmacy's mistake if she need [place] she stated she will give [place] a call back.

## 2020-11-08 ENCOUNTER — Other Ambulatory Visit: Payer: Self-pay | Admitting: Adult Health

## 2020-11-08 DIAGNOSIS — F32A Depression, unspecified: Secondary | ICD-10-CM

## 2020-11-08 DIAGNOSIS — F419 Anxiety disorder, unspecified: Secondary | ICD-10-CM

## 2020-11-08 NOTE — Telephone Encounter (Signed)
Last filled 07/05/2020 Last OV 11/03/2019 (acute)  Ok to fill?

## 2020-11-11 ENCOUNTER — Other Ambulatory Visit: Payer: Self-pay | Admitting: Adult Health

## 2020-11-11 DIAGNOSIS — E785 Hyperlipidemia, unspecified: Secondary | ICD-10-CM

## 2020-11-12 ENCOUNTER — Other Ambulatory Visit: Payer: Self-pay | Admitting: Adult Health

## 2020-11-12 ENCOUNTER — Other Ambulatory Visit: Payer: Self-pay

## 2020-11-12 DIAGNOSIS — E785 Hyperlipidemia, unspecified: Secondary | ICD-10-CM

## 2020-11-12 MED ORDER — ATORVASTATIN CALCIUM 20 MG PO TABS: 20.0000 mg | ORAL_TABLET | Freq: Every day | ORAL | 0 refills | Status: DC

## 2020-11-26 ENCOUNTER — Encounter: Payer: Self-pay | Admitting: Adult Health

## 2020-11-26 ENCOUNTER — Other Ambulatory Visit: Payer: Self-pay | Admitting: Adult Health

## 2020-11-26 DIAGNOSIS — I10 Essential (primary) hypertension: Secondary | ICD-10-CM

## 2020-11-26 DIAGNOSIS — F32A Depression, unspecified: Secondary | ICD-10-CM

## 2020-12-19 ENCOUNTER — Encounter: Payer: Self-pay | Admitting: Adult Health

## 2020-12-25 ENCOUNTER — Other Ambulatory Visit: Payer: Self-pay | Admitting: Adult Health

## 2020-12-25 DIAGNOSIS — F32A Depression, unspecified: Secondary | ICD-10-CM

## 2020-12-25 DIAGNOSIS — E785 Hyperlipidemia, unspecified: Secondary | ICD-10-CM

## 2021-01-07 ENCOUNTER — Other Ambulatory Visit: Payer: Self-pay | Admitting: Adult Health

## 2021-01-07 DIAGNOSIS — F32A Depression, unspecified: Secondary | ICD-10-CM

## 2021-01-07 DIAGNOSIS — E785 Hyperlipidemia, unspecified: Secondary | ICD-10-CM

## 2021-01-07 NOTE — Telephone Encounter (Signed)
Pt is calling in stating that she is out of Rx's Alprazolam (XANAX) 1 MG and atorvastatin (LIPITOR) 20 MG and would like to see if it can be called in today since she has not had the statin drug since last week 01/04/2021  Pharm:  [facility] [address].

## 2021-01-08 ENCOUNTER — Other Ambulatory Visit: Payer: Self-pay | Admitting: Adult Health

## 2021-01-08 DIAGNOSIS — E785 Hyperlipidemia, unspecified: Secondary | ICD-10-CM

## 2021-01-08 NOTE — Telephone Encounter (Signed)
Pt called back and has scheduled an CPE for 01/24/2021 @ 3:30 and would like to see if she can get the Rx alprazolam Duanne Moron) 1 MG pt stated that she has not had it since Sunday and will be going out of town on Friday 01/11/2021.  Pharm:  [facility] [address]. Pt inquired about the Rx atorvastatin and is aware that it has been called in to the pharmacy.

## 2021-01-08 NOTE — Telephone Encounter (Signed)
Okay for refill?    LOV  10/2019   Pt advised that she hasn't been seen in a little over a year. Pt also advised to make an appt. Pt declined making the appt. At this time and stated she has to see when she is available. Pt also articulated that she has a 1/2 tab left and needs this medication filled today bc she cannot go a day without her medication. Pt advised that I will route the message to [facility] who will be in the office tomorrow. Pt also notified that it does not mean he will be able to get to it tomorrow it could be Friday. Pt verbalized understanding.

## 2021-01-23 ENCOUNTER — Other Ambulatory Visit: Payer: Self-pay

## 2021-01-24 ENCOUNTER — Encounter: Payer: Self-pay | Admitting: Adult Health

## 2021-01-24 ENCOUNTER — Ambulatory Visit (INDEPENDENT_AMBULATORY_CARE_PROVIDER_SITE_OTHER): Payer: PPO | Admitting: Adult Health

## 2021-01-24 VITALS — BP 128/80 | HR 69 | Temp 97.4°F | Ht 68.0 in | Wt 205.0 lb

## 2021-01-24 DIAGNOSIS — F419 Anxiety disorder, unspecified: Secondary | ICD-10-CM

## 2021-01-24 DIAGNOSIS — I10 Essential (primary) hypertension: Secondary | ICD-10-CM | POA: Diagnosis not present

## 2021-01-24 DIAGNOSIS — F341 Dysthymic disorder: Secondary | ICD-10-CM

## 2021-01-24 DIAGNOSIS — E785 Hyperlipidemia, unspecified: Secondary | ICD-10-CM

## 2021-01-24 DIAGNOSIS — Z0000 Encounter for general adult medical examination without abnormal findings: Secondary | ICD-10-CM

## 2021-01-24 DIAGNOSIS — M5441 Lumbago with sciatica, right side: Secondary | ICD-10-CM | POA: Diagnosis not present

## 2021-01-24 DIAGNOSIS — F32A Depression, unspecified: Secondary | ICD-10-CM

## 2021-01-24 MED ORDER — METHYLPREDNISOLONE 4 MG PO TBPK: ORAL_TABLET | ORAL | 0 refills | Status: DC

## 2021-01-24 NOTE — Patient Instructions (Signed)
It was good seeing you today   We will follow up with you regarding your blood work   I have sent in a steroid back to help with the back pain

## 2021-01-24 NOTE — Progress Notes (Signed)
Subjective:    Patient ID: Julie [name], female    DOB: 1945-11-03, 75 y.o.   MRN: [id number]  HPI Patient presents for yearly preventative medicine examination. She is a pleasant 75 year old female who  has a past medical history of ALLERGIC RHINITIS, Anxiety, Arthritis, ARTHRITIS, TRAUMATIC, ANKLE, Cataract, Colitis, Depression, Dyslipidemia, EXOGENOUS OBESITY, GERD (gastroesophageal reflux disease), Hemorrhoids, Hyperglycemia, and Melanoma in situ of left lower leg ([place]).  Hyperlipidemia-takes Lipitor 20 mg and aspirin 81 mg daily.  She denies myalgia or fatigue Lab Results  Component Value Date   CHOL 131 10/21/2019   HDL 53.00 10/21/2019   LDLCALC 54 10/21/2019   LDLDIRECT 130.6 12/18/2008   TRIG 118.0 10/21/2019   CHOLHDL 2 10/21/2019   Hypertension -controlled with Cozaar 25 mg daily.  She denies dizziness, lightheadedness, chest pain, shortness of breath BP Readings from Last 3 Encounters:  01/24/21 128/80  10/24/19 130/60  10/21/19 114/72   Anxiety/Depression - currently prescribed Wellburin 300 mg and Xanax 1 mg BID PRN.  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  She is currently looking for facility  Low Back pain with pain radiating down her right leg -acute issue.  Believes that she may have injured her low back while helping clean out her mother's house.  Has trouble with range of motion as well as changing positions.  She does feel a discomfort radiate down the outside of her right leg.  All immunizations and health maintenance protocols were reviewed with the patient and needed orders were placed.  Appropriate screening laboratory values were ordered for the patient including screening of hyperlipidemia, renal function and hepatic function.  Medication reconciliation,  past medical history, social history, problem list and allergies were reviewed in  detail with the patient  Goals were established with regard to weight loss, exercise, and  diet in compliance with medications  Wt Readings from Last 3 Encounters:  01/24/21 205 lb (93 kg)  10/21/19 201 lb (91.2 kg)  07/28/18 200 lb (90.7 kg)   She is up to date on routine colon cancer screening and breast cancer screening.   Review of Systems  Constitutional: Negative.   HENT: Negative.    Eyes: Negative.   Respiratory: Negative.    Cardiovascular: Negative.   Gastrointestinal: Negative.   Endocrine: Negative.   Genitourinary: Negative.   Musculoskeletal:  Positive for back pain.  Skin: Negative.   Allergic/Immunologic: Negative.   Neurological: Negative.   Hematological: Negative.   Psychiatric/Behavioral:  Positive for agitation.   All other systems reviewed and are negative.  Past Medical History:  Diagnosis Date   ALLERGIC RHINITIS    Anxiety    chronic BZs   Arthritis    ARTHRITIS, TRAUMATIC, ANKLE    Cataract    Colitis    Depression    Dyslipidemia    EXOGENOUS OBESITY    GERD (gastroesophageal reflux disease)    Hemorrhoids    Hyperglycemia    Melanoma in situ of left lower leg ([facility])     Social History   Socioeconomic History   Marital status: Widowed    Spouse name: Not on file   Number of children: 2   Years of education: Not on file   Highest education level: Not on file  Occupational History   Occupation: retired  Tobacco Use   Smoking status: Never   Smokeless tobacco: Never  Vaping Use   Vaping Use: Never used  Substance and Sexual Activity   Alcohol use: Yes    Comment: social   Drug use: No   Sexual activity: Not on file  Other Topics Concern   Not on file  Social History Narrative   Widowed (spouse - Ron of >44years prior to death)   widowed 12-Apr-2011 - 2 grown supportive dtrs, 5 g-kis (3 are triplets)   Social Determinants of Radio broadcast assistant Strain: Low Risk    Difficulty of Paying Living Expenses: Not hard at all   Food Insecurity: No Food Insecurity   Worried About Charity fundraiser in the Last Year: Never true   Arboriculturist in the Last Year: Never true  Transportation Needs: No Transportation Needs   Lack of Transportation (Medical): No   Lack of Transportation (Non-Medical): No  Physical Activity: Inactive   Days of Exercise per Week: 0 days   Minutes of Exercise per Session: 0 min  Stress: No Stress Concern Present   Feeling of Stress : Not at all  Social Connections: Moderately Isolated   Frequency of Communication with Friends and Family: More than three times a week   Frequency of Social Gatherings with Friends and Family: Three times a week   Attends Religious Services: More than 4 times per year   Active Member of Clubs or Organizations: No   Attends Archivist Meetings: Never   Marital Status: Widowed  Intimate Partner Violence: Not At Risk   Fear of Current or Ex-Partner: No   Emotionally Abused: No   Physically Abused: No   Sexually Abused: No    Past Surgical History:  Procedure Laterality Date   ABDOMINAL HYSTERECTOMY  10/1978   ANKLE ARTHROSCOPY Right 10/19/12   deorio   arthroscopy left knee  05/2002, 04/2010   BREAST SURGERY  11/1988   Benign mass (R) breast   C. surgery history and scan sheet     Cataract surgery     (L) eye 09/06/12 & (R) eye 10/11/12   CHOLECYSTECTOMY  09/2004   cysto sling  10/2006   3 times   FRACTURE SURGERY     right ankle   JOINT REPLACEMENT  06/2010   R ankle   MASS EXCISION     under right arm   removal multiple cyst breast left and right     sling anterior repair  12/1999   SPINE SURGERY     laminotomy with decompression   Toe Exostosis  10/08   Bil   TOTAL ANKLE REPLACEMENT Right     Family History  Problem Relation Age of Onset   Hyperlipidemia Father    Coronary artery disease Father        cabg 76yo   Leukemia Father    Hypertension Father    Heart attack Brother 20       stents x 2 (29 & 64 yo)   Diabetes  Brother    Anal fissures Brother    Diabetes Mother    Hyperlipidemia Mother    Hypertension Mother    Colon cancer Neg Hx    Esophageal cancer Neg Hx     Allergies  Allergen Reactions   Hydrocodone-Acetaminophen     REACTION: hives, itiching   Latex     Causes redness and itching   Lisinopril Cough   Oxycodone-Acetaminophen Itching    REACTION: itching    Current Outpatient Medications on File Prior to Visit  Medication Sig Dispense Refill   acetaminophen (TYLENOL) 500 MG tablet Take 2 tablets (1,000 mg total) by mouth every 6 (six) hours as needed. 30 tablet 0   ALPRAZolam (XANAX) 1 MG tablet TAKE 1 TABLET(1 MG) BY MOUTH TWICE DAILY AS NEEDED FOR ANXIETY 60 tablet 0   aspirin 81 MG tablet Take 81 mg by mouth daily.       atorvastatin (LIPITOR) 20 MG tablet TAKE 1 TABLET(20 MG) BY MOUTH DAILY 90 tablet 0   buPROPion (WELLBUTRIN XL) 300 MG 24 hr tablet TAKE 1 TABLET(300 MG) BY MOUTH DAILY 90 tablet 0   Calcium Carbonate-Vitamin D 600-400 MG-UNIT tablet Take 1 tablet by mouth daily.       losartan (COZAAR) 25 MG tablet TAKE 1 TABLET(25 MG) BY MOUTH DAILY 90 tablet 0   Multiple Vitamin (MULTIVITAMIN) tablet Take 1 tablet by mouth daily.       RA ALLERGY RELIEF 180 MG tablet take 1 tablet by mouth once daily 30 tablet 5   vitamin B-12 (CYANOCOBALAMIN) 1000 MCG tablet Take 1,000 mcg by mouth daily.     No current facility-administered medications on file prior to visit.    BP 128/80   Pulse 69   Temp (!) 97.4 F (36.3 C) (Oral)   Ht 5\' 8"  (1.727 m)   Wt 205 lb (93 kg)   SpO2 96%   BMI 31.17 kg/m       Objective:   Physical Exam Vitals and nursing note reviewed.  Constitutional:      General: She is not in acute distress.    Appearance: Normal appearance. She is well-developed. She is obese. She is not ill-appearing.  HENT:     Head: Normocephalic and atraumatic.     Right Ear: Tympanic membrane, ear canal and external ear normal. There is no impacted cerumen.      Left Ear: Tympanic membrane, ear canal and external ear normal. There is no impacted cerumen.     Nose: Nose normal. No congestion or rhinorrhea.     Mouth/Throat:     Mouth: Mucous membranes are moist.     Pharynx: Oropharynx is clear. No oropharyngeal exudate or posterior oropharyngeal erythema.  Eyes:     General:        Right eye: No discharge.        Left eye: No discharge.     Extraocular Movements: Extraocular movements intact.     Conjunctiva/sclera: Conjunctivae normal.     Pupils: Pupils are equal, round, and reactive to light.  Neck:     Thyroid: No thyromegaly.     Vascular: No carotid bruit.     Trachea: No tracheal deviation.  Cardiovascular:     Rate and Rhythm: Normal rate and regular rhythm.     Pulses: Normal pulses.     Heart sounds: Normal heart sounds. No murmur heard.   No friction rub. No gallop.  Pulmonary:     Effort: Pulmonary effort is normal. No respiratory distress.     Breath sounds: Normal breath sounds. No stridor. No wheezing, rhonchi or rales.  Chest:     Chest wall: No tenderness.  Abdominal:     General: Abdomen is flat. Bowel sounds are normal. There is no distension.     Palpations: Abdomen is soft. There is no mass.     Tenderness: There is no abdominal tenderness. There is no right CVA tenderness, left CVA tenderness, guarding or rebound.     Hernia: No hernia is present.  Musculoskeletal:        General: Tenderness (Tenderness with palpation along paraspinal muscles on right lumbar spine as well as along her right sided sciatic nerve.) present. No swelling, deformity or signs of injury. Normal range of motion.     Cervical back: Normal range of motion and neck supple.     Right lower leg: No edema.     Left lower leg: No edema.  Lymphadenopathy:     Cervical: No cervical adenopathy.  Skin:    General: Skin is warm and dry.     Coloration: Skin is not jaundiced or pale.     Findings: No bruising, erythema, lesion or rash.   Neurological:     General: No focal deficit present.     Mental Status: She is alert and oriented to person, place, and time.     Cranial Nerves: No cranial nerve deficit.     Sensory: No sensory deficit.     Motor: No weakness.     Coordination: Coordination normal.     Gait: Gait normal.     Deep Tendon Reflexes: Reflexes normal.  Psychiatric:        Mood and Affect: Mood normal.        Behavior: Behavior normal.        Thought Content: Thought content normal.        Judgment: Judgment normal.      Assessment & Plan:  1. Routine general medical examination at a health care facility -Lifestyle modifications to help lose weight and for stress relief -Follow-up in 1 year or sooner if needed - CBC with Differential/Platelet; Future - Comprehensive metabolic panel; Future - Lipid panel; Future - TSH; Future - TSH - Lipid panel - Comprehensive metabolic panel - CBC with Differential/Platelet  2. Anxiety and depression - PHQ 10 -continue with current medication therapy  3. Essential hypertension -Controlled no change in medication - CBC with Differential/Platelet; Future - Comprehensive metabolic panel; Future - Lipid panel; Future - TSH; Future - TSH - Lipid panel - Comprehensive metabolic panel - CBC with Differential/Platelet  4. Dyslipidemia -Consider increase in statin - CBC with Differential/Platelet; Future - Comprehensive metabolic panel; Future - Lipid panel; Future - TSH; Future - TSH - Lipid panel - Comprehensive metabolic panel - CBC with Differential/Platelet  5. Acute right-sided low back pain with right-sided sciatica -Advised stretching exercises and warm compress.  We will send in Medrol Dosepak to help alleviate her discomfort - methylPREDNISolone (MEDROL DOSEPAK) 4 MG TBPK tablet; Take as directed  Dispense: 21 tablet; Refill: 0

## 2021-01-25 LAB — LIPID PANEL
Cholesterol: 138 mg/dL (ref 0–200)
HDL: 61.1 mg/dL (ref >39.00)
LDL Cholesterol: 63 mg/dL (ref 0–99)
NonHDL: 76.96
Total CHOL/HDL Ratio: 2
Triglycerides: 70 mg/dL (ref 0.0–149.0)
VLDL: 14 mg/dL (ref 0.0–40.0)

## 2021-01-25 LAB — CBC WITH DIFFERENTIAL/PLATELET
Basophils Absolute: 0.1 10*3/uL (ref 0.0–0.1)
Basophils Relative: 0.8 % (ref 0.0–3.0)
Eosinophils Absolute: 0.2 10*3/uL (ref 0.0–0.7)
Eosinophils Relative: 1.8 % (ref 0.0–5.0)
HCT: 39.9 % (ref 36.0–46.0)
Hemoglobin: 13.5 g/dL (ref 12.0–15.0)
Lymphocytes Relative: 24.9 % (ref 12.0–46.0)
Lymphs Abs: 2.2 10*3/uL (ref 0.7–4.0)
MCHC: 33.7 g/dL (ref 30.0–36.0)
MCV: 88.3 fl (ref 78.0–100.0)
Monocytes Absolute: 0.5 10*3/uL (ref 0.1–1.0)
Monocytes Relative: 5.8 % (ref 3.0–12.0)
Neutro Abs: 5.9 10*3/uL (ref 1.4–7.7)
Neutrophils Relative %: 66.7 % (ref 43.0–77.0)
Platelets: 260 10*3/uL (ref 150.0–400.0)
RBC: 4.52 Mil/uL (ref 3.87–5.11)
RDW: 13.4 % (ref 11.5–15.5)
WBC: 8.8 10*3/uL (ref 4.0–10.5)

## 2021-01-25 LAB — COMPREHENSIVE METABOLIC PANEL
ALT: 21 U/L (ref 0–35)
AST: 21 U/L (ref 0–37)
Albumin: 4.3 g/dL (ref 3.5–5.2)
Alkaline Phosphatase: 69 U/L (ref 39–117)
BUN: 13 mg/dL (ref 6–23)
CO2: 28 mEq/L (ref 19–32)
Calcium: 9.4 mg/dL (ref 8.4–10.5)
Chloride: 105 mEq/L (ref 96–112)
Creatinine, Ser: 0.98 mg/dL (ref 0.40–1.20)
GFR: 56.76 mL/min — ABNORMAL LOW (ref >60.00)
Glucose, Bld: 98 mg/dL (ref 70–99)
Potassium: 4.2 mEq/L (ref 3.5–5.1)
Sodium: 141 mEq/L (ref 135–145)
Total Bilirubin: 0.6 mg/dL (ref 0.2–1.2)
Total Protein: 7.1 g/dL (ref 6.0–8.3)

## 2021-01-25 LAB — TSH: TSH: 1.55 u[IU]/mL (ref 0.35–5.50)

## 2021-01-25 MED ORDER — ATORVASTATIN CALCIUM 20 MG PO TABS: ORAL_TABLET | ORAL | 3 refills | Status: DC

## 2021-01-25 MED ORDER — LOSARTAN POTASSIUM 25 MG PO TABS: ORAL_TABLET | ORAL | 3 refills | Status: DC

## 2021-01-25 MED ORDER — ALPRAZOLAM 1 MG PO TABS: ORAL_TABLET | ORAL | 2 refills | Status: DC

## 2021-02-24 ENCOUNTER — Other Ambulatory Visit: Payer: Self-pay | Admitting: Adult Health

## 2021-02-24 DIAGNOSIS — F32A Depression, unspecified: Secondary | ICD-10-CM

## 2021-02-24 DIAGNOSIS — F419 Anxiety disorder, unspecified: Secondary | ICD-10-CM

## 2021-03-08 ENCOUNTER — Ambulatory Visit (INDEPENDENT_AMBULATORY_CARE_PROVIDER_SITE_OTHER): Payer: PPO

## 2021-03-08 DIAGNOSIS — Z0000 Encounter for general adult medical examination without abnormal findings: Secondary | ICD-10-CM | POA: Diagnosis not present

## 2021-03-08 NOTE — Progress Notes (Signed)
Subjective:   Julie [name] is a 75 y.o. female who presents for Medicare Annual (Subsequent) preventive examination.  I connected with Julie [name] today by telephone and verified that I am speaking with the correct person using two identifiers. Location patient: home Location provider: work Persons participating in the virtual visit: patient, provider.   I discussed the limitations, risks, security and privacy concerns of performing an evaluation and management service by telephone and the availability of in person appointments. I also discussed with the patient that there may be a patient responsible charge related to this service. The patient expressed understanding and verbally consented to this telephonic visit.    Interactive audio and video telecommunications were attempted between this provider and patient, however failed, due to patient having technical difficulties OR patient did not have access to video capability.  We continued and completed visit with audio only.    Review of Systems    N/a       Objective:    There were no vitals filed for this visit. There is no height or weight on file to calculate BMI.  Advanced Directives 03/07/2020 10/11/2016 04/10/2016 06/25/2015  Does Patient Have a Medical Advance Directive? No No No No  Would patient like information on creating a medical advance directive? No - Patient declined No - Patient declined (No Data) -    Current Medications (verified) Outpatient Encounter Medications as of 03/08/2021  Medication Sig   acetaminophen (TYLENOL) 500 MG tablet Take 2 tablets (1,000 mg total) by mouth every 6 (six) hours as needed.   ALPRAZolam (XANAX) 1 MG tablet TAKE 1 TABLET(1 MG) BY MOUTH TWICE DAILY AS NEEDED FOR ANXIETY   aspirin 81 MG tablet Take 81 mg by mouth daily.     atorvastatin (LIPITOR) 20 MG tablet TAKE 1 TABLET(20 MG) BY MOUTH DAILY   buPROPion (WELLBUTRIN XL) 300 MG 24 hr tablet TAKE 1 TABLET(300 MG) BY MOUTH  DAILY   Calcium Carbonate-Vitamin D 600-400 MG-UNIT tablet Take 1 tablet by mouth daily.     losartan (COZAAR) 25 MG tablet TAKE 1 TABLET(25 MG) BY MOUTH DAILY   methylPREDNISolone (MEDROL DOSEPAK) 4 MG TBPK tablet Take as directed   Multiple Vitamin (MULTIVITAMIN) tablet Take 1 tablet by mouth daily.     RA ALLERGY RELIEF 180 MG tablet take 1 tablet by mouth once daily   vitamin B-12 (CYANOCOBALAMIN) 1000 MCG tablet Take 1,000 mcg by mouth daily.   No facility-administered encounter medications on file as of 03/08/2021.    Allergies (verified) Hydrocodone-acetaminophen, Latex, Lisinopril, and Oxycodone-acetaminophen   History: Past Medical History:  Diagnosis Date   ALLERGIC RHINITIS    Anxiety    chronic BZs   Arthritis    ARTHRITIS, TRAUMATIC, ANKLE    Cataract    Colitis    Depression    Dyslipidemia    EXOGENOUS OBESITY    GERD (gastroesophageal reflux disease)    Hemorrhoids    Hyperglycemia    Melanoma in situ of left lower leg [facility])    Past Surgical History:  Procedure Laterality Date   ABDOMINAL HYSTERECTOMY  10/1978   ANKLE ARTHROSCOPY Right 10/19/12   deorio   arthroscopy left knee  05/2002, 04/2010   BREAST SURGERY  11/1988   Benign mass (R) breast   C. surgery history and scan sheet     Cataract surgery     (L) eye 09/06/12 & (R) eye 10/11/12   CHOLECYSTECTOMY  09/2004   cysto sling  10/2006  3 times   FRACTURE SURGERY     right ankle   JOINT REPLACEMENT  06/2010   R ankle   MASS EXCISION     under right arm   removal multiple cyst breast left and right     sling anterior repair  12/1999   SPINE SURGERY     laminotomy with decompression   Toe Exostosis  10/08   Bil   TOTAL ANKLE REPLACEMENT Right    Family History  Problem Relation Age of Onset   Hyperlipidemia Father    Coronary artery disease Father        cabg 40yo   Leukemia Father    Hypertension Father    Heart attack Brother 12       stents x 2 (20 & 38 yo)   Diabetes Brother    Anal  fissures Brother    Diabetes Mother    Hyperlipidemia Mother    Hypertension Mother    Colon cancer Neg Hx    Esophageal cancer Neg Hx    Social History   Socioeconomic History   Marital status: Widowed    Spouse name: Not on file   Number of children: 2   Years of education: Not on file   Highest education level: Not on file  Occupational History   Occupation: retired  Tobacco Use   Smoking status: Never   Smokeless tobacco: Never  Vaping Use   Vaping Use: Never used  Substance and Sexual Activity   Alcohol use: Yes    Comment: social   Drug use: No   Sexual activity: Not on file  Other Topics Concern   Not on file  Social History Narrative   Widowed (spouse - Ron of >44years prior to death)   widowed April 05, 2011 - 2 grown supportive dtrs, 5 g-kis (3 are triplets)   Social Determinants of Radio broadcast assistant Strain: Low Risk    Difficulty of Paying Living Expenses: Not hard at all  Food Insecurity: No Food Insecurity   Worried About Charity fundraiser in the Last Year: Never true   Arboriculturist in the Last Year: Never true  Transportation Needs: No Transportation Needs   Lack of Transportation (Medical): No   Lack of Transportation (Non-Medical): No  Physical Activity: Inactive   Days of Exercise per Week: 0 days   Minutes of Exercise per Session: 0 min  Stress: No Stress Concern Present   Feeling of Stress : Not at all  Social Connections: Moderately Isolated   Frequency of Communication with Friends and Family: More than three times a week   Frequency of Social Gatherings with Friends and Family: Three times a week   Attends Religious Services: More than 4 times per year   Active Member of Clubs or Organizations: No   Attends Archivist Meetings: Never   Marital Status: Widowed    Tobacco Counseling Counseling given: Not Answered   Clinical Intake:                 Diabetic?no         Activities of Daily Living No  flowsheet data found.  Patient Care Team: Dorothyann Peng, NP as PCP - General (Family Medicine) Louretta Shorten, MD (Obstetrics and Gynecology) Earlie Server, MD (Orthopedic Surgery) Deorio, Sharion Dove, MD (Orthopedic Surgery)  Indicate any recent Medical Services you may have received from other than [facility] providers in the past year (date may be approximate).  Assessment:   This is a routine wellness examination for Jamayia.  Hearing/Vision screen No results found.  Dietary issues and exercise activities discussed:     Goals Addressed   None    Depression Screen PHQ 2/9 Scores 01/24/2021 03/07/2020 06/29/2017 04/10/2016 07/31/2015 03/14/2015 03/09/2014  PHQ - 2 Score '5 2 6 '$ 0 0 0 2  PHQ- 9 Score 10 3 - - - - 5    Fall Risk Fall Risk  03/07/2020 06/29/2017 04/10/2016 07/31/2015 03/14/2015  Falls in the past year? 0 No No No No  Number falls in past yr: 0 - - - -  Injury with Fall? 0 - - - -  Risk for fall due to : Impaired balance/gait;Medication side effect - - - -  Follow up Falls evaluation completed;Falls prevention discussed - - - -    FALL RISK PREVENTION PERTAINING TO THE HOME:  Any stairs in or around the home? Yes  If so, are there any without handrails? No  Home free of loose throw rugs in walkways, pet beds, electrical cords, etc? Yes  Adequate lighting in your home to reduce risk of falls? Yes   ASSISTIVE DEVICES UTILIZED TO PREVENT FALLS:  Life alert? No  Use of a cane, walker or w/c? No  Grab bars in the bathroom? Yes  Shower chair or bench in shower? Yes  Elevated toilet seat or a handicapped toilet? Yes    Cognitive Function: Normal cognitive status assessed by direct observation by this Nurse Health Advisor. No abnormalities found.   MMSE - Mini Mental State Exam 04/10/2016  Not completed: (No Data)        Immunizations Immunization History  Administered Date(s) Administered   Fluad Quad(high Dose 65+) 04/12/2019, 04/26/2020   Influenza Split  05/21/2012   Influenza Whole 04/12/2009, 04/16/2010, 04/05/2011   Influenza, High Dose Seasonal PF 04/18/2013, 03/27/2016, 04/22/2018   Influenza,inj,Quad PF,6+ Mos 03/09/2014   Influenza-Unspecified 04/26/2015   PFIZER(Purple Top)SARS-COV-2 Vaccination 08/28/2019, 09/20/2019   Pneumococcal Conjugate-13 03/14/2015   Pneumococcal Polysaccharide-23 06/30/2011   Td 10/16/2005, 04/12/2009   Zoster, Live 02/03/2012    TDAP status: Due, Education has been provided regarding the importance of this vaccine. Advised may receive this vaccine at local pharmacy or Health Dept. Aware to provide a copy of the vaccination record if obtained from local pharmacy or Health Dept. Verbalized acceptance and understanding.  Flu Vaccine status: Up to date  Pneumococcal vaccine status: Up to date  Covid-19 vaccine status: Completed vaccines  Qualifies for Shingles Vaccine? Yes   Zostavax completed No   Shingrix Completed?: No.    Education has been provided regarding the importance of this vaccine. Patient has been advised to call insurance company to determine out of pocket expense if they have not yet received this vaccine. Advised may also receive vaccine at local pharmacy or Health Dept. Verbalized acceptance and understanding.  Screening Tests Health Maintenance  Topic Date Due   Zoster Vaccines- Shingrix (1 of 2) Never done   TETANUS/TDAP  04/13/2019   COVID-19 Vaccine (3 - Booster for Pfizer series) 02/20/2020   INFLUENZA VACCINE  02/11/2021   MAMMOGRAM  05/30/2022   COLONOSCOPY (Pts 45-11yr Insurance coverage will need to be confirmed)  06/24/2025   DEXA SCAN  Completed   Hepatitis C Screening  Completed   PNA vac Low Risk Adult  Completed   HPV VACCINES  Aged Out    Health Maintenance  Health Maintenance Due  Topic Date Due   Zoster Vaccines- Shingrix (1  of 2) Never done   TETANUS/TDAP  04/13/2019   COVID-19 Vaccine (3 - Booster for Pfizer series) 02/20/2020   INFLUENZA VACCINE   02/11/2021    Colorectal cancer screening: Type of screening: Colonoscopy. Completed 06/25/2015. Repeat every 10 years  Mammogram status: Completed 05/30/2020. Repeat every year  Bone Density status: Completed 05/30/2020. Results reflect: Bone density results: OSTEOPENIA. Repeat every 5 years.  Lung Cancer Screening: (Low Dose CT Chest recommended if Age 45-80 years, 30 pack-year currently smoking OR have quit w/in 15years.) does not qualify.   Lung Cancer Screening Referral: n/a  Additional Screening:  Hepatitis C Screening: does not qualify; Completed 03/21/2016  Vision Screening: Recommended annual ophthalmology exams for early detection of glaucoma and other disorders of the eye. Is the patient up to date with their annual eye exam?  Yes  Who is the provider or what is the name of the office in which the patient attends annual eye exams? Dr.Parker  If pt is not established with a provider, would they like to be referred to a provider to establish care? No .   Dental Screening: Recommended annual dental exams for proper oral hygiene  Community Resource Referral / Chronic Care Management: CRR required this visit?  No   CCM required this visit?  No      Plan:     I have personally reviewed and noted the following in the patient's chart:   Medical and social history Use of alcohol, tobacco or illicit drugs  Current medications and supplements including opioid prescriptions.  Functional ability and status Nutritional status Physical activity Advanced directives List of other physicians Hospitalizations, surgeries, and ER visits in previous 12 months Vitals Screenings to include cognitive, depression, and falls Referrals and appointments  In addition, I have reviewed and discussed with patient certain preventive protocols, quality metrics, and best practice recommendations. A written personalized care plan for preventive services as well as general preventive health  recommendations were provided to patient.     Randel Pigg, LPN   [id number]   Nurse Notes: none

## 2021-03-08 NOTE — Patient Instructions (Signed)
Julie [name] , Thank you for taking time to come for your Medicare Wellness Visit. I appreciate your ongoing commitment to your health goals. Please review the following plan we discussed and let me know if I can assist you in the future.   Screening recommendations/referrals: Colonoscopy: 06/25/2015  due 2026 Mammogram: 05/30/2020 Bone Density: 04/13/2015 Recommended yearly ophthalmology/optometry visit for glaucoma screening and checkup Recommended yearly dental visit for hygiene and checkup  Vaccinations: Influenza vaccine: due fall 2022  Pneumococcal vaccine: completed series  Tdap vaccine: due upon injury  Shingles vaccine: decline     Advanced directives: none   Conditions/risks identified: none   Next appointment: none    Preventive Care 75 Years and Older, Female Preventive care refers to lifestyle choices and visits with your health care provider that can promote health and wellness. What does preventive care include? A yearly physical exam. This is also called an annual well check. Dental exams once or twice a year. Routine eye exams. Ask your health care provider how often you should have your eyes checked. Personal lifestyle choices, including: Daily care of your teeth and gums. Regular physical activity. Eating a healthy diet. Avoiding tobacco and drug use. Limiting alcohol use. Practicing safe sex. Taking low-dose aspirin every day. Taking vitamin and mineral supplements as recommended by your health care provider. What happens during an annual well check? The services and screenings done by your health care provider during your annual well check will depend on your age, overall health, lifestyle risk factors, and family history of disease. Counseling  Your health care provider may ask you questions about your: Alcohol use. Tobacco use. Drug use. Emotional well-being. Home and relationship well-being. Sexual activity. Eating habits. History of  falls. Memory and ability to understand (cognition). Work and work Statistician. Reproductive health. Screening  You may have the following tests or measurements: Height, weight, and BMI. Blood pressure. Lipid and cholesterol levels. These may be checked every 5 years, or more frequently if you are over 82 years old. Skin check. Lung cancer screening. You may have this screening every year starting at age 32 if you have a 30-pack-year history of smoking and currently smoke or have quit within the past 15 years. Fecal occult blood test (FOBT) of the stool. You may have this test every year starting at age 59. Flexible sigmoidoscopy or colonoscopy. You may have a sigmoidoscopy every 5 years or a colonoscopy every 10 years starting at age 51. Hepatitis C blood test. Hepatitis B blood test. Sexually transmitted disease (STD) testing. Diabetes screening. This is done by checking your blood sugar (glucose) after you have not eaten for a while (fasting). You may have this done every 1-3 years. Bone density scan. This is done to screen for osteoporosis. You may have this done starting at age 30. Mammogram. This may be done every 1-2 years. Talk to your health care provider about how often you should have regular mammograms. Talk with your health care provider about your test results, treatment options, and if necessary, the need for more tests. Vaccines  Your health care provider may recommend certain vaccines, such as: Influenza vaccine. This is recommended every year. Tetanus, diphtheria, and acellular pertussis (Tdap, Td) vaccine. You may need a Td booster every 10 years. Zoster vaccine. You may need this after age 75. Pneumococcal 13-valent conjugate (PCV13) vaccine. One dose is recommended after age 2. Pneumococcal polysaccharide (PPSV23) vaccine. One dose is recommended after age 72. Talk to your health care provider about which  screenings and vaccines you need and how often you need  them. This information is not intended to replace advice given to you by your health care provider. Make sure you discuss any questions you have with your health care provider. Document Released: 07/27/2015 Document Revised: 03/19/2016 Document Reviewed: 05/01/2015 Elsevier Interactive Patient Education  2017 [place] Prevention in the Home Falls can cause injuries. They can happen to people of all ages. There are many things you can do to make your home safe and to help prevent falls. What can I do on the outside of my home? Regularly fix the edges of walkways and driveways and fix any cracks. Remove anything that might make you trip as you walk through a door, such as a raised step or threshold. Trim any bushes or trees on the path to your home. Use bright outdoor lighting. Clear any walking paths of anything that might make someone trip, such as rocks or tools. Regularly check to see if handrails are loose or broken. Make sure that both sides of any steps have handrails. Any raised decks and porches should have guardrails on the edges. Have any leaves, snow, or ice cleared regularly. Use sand or salt on walking paths during winter. Clean up any spills in your garage right away. This includes oil or grease spills. What can I do in the bathroom? Use night lights. Install grab bars by the toilet and in the tub and shower. Do not use towel bars as grab bars. Use non-skid mats or decals in the tub or shower. If you need to sit down in the shower, use a plastic, non-slip stool. Keep the floor dry. Clean up any water that spills on the floor as soon as it happens. Remove soap buildup in the tub or shower regularly. Attach bath mats securely with double-sided non-slip rug tape. Do not have throw rugs and other things on the floor that can make you trip. What can I do in the bedroom? Use night lights. Make sure that you have a light by your bed that is easy to reach. Do not use  any sheets or blankets that are too big for your bed. They should not hang down onto the floor. Have a firm chair that has side arms. You can use this for support while you get dressed. Do not have throw rugs and other things on the floor that can make you trip. What can I do in the kitchen? Clean up any spills right away. Avoid walking on wet floors. Keep items that you use a lot in easy-to-reach places. If you need to reach something above you, use a strong step stool that has a grab bar. Keep electrical cords out of the way. Do not use floor polish or wax that makes floors slippery. If you must use wax, use non-skid floor wax. Do not have throw rugs and other things on the floor that can make you trip. What can I do with my stairs? Do not leave any items on the stairs. Make sure that there are handrails on both sides of the stairs and use them. Fix handrails that are broken or loose. Make sure that handrails are as long as the stairways. Check any carpeting to make sure that it is firmly attached to the stairs. Fix any carpet that is loose or worn. Avoid having throw rugs at the top or bottom of the stairs. If you do have throw rugs, attach them to the floor with carpet  tape. Make sure that you have a light switch at the top of the stairs and the bottom of the stairs. If you do not have them, ask someone to add them for you. What else can I do to help prevent falls? Wear shoes that: Do not have high heels. Have rubber bottoms. Are comfortable and fit you well. Are closed at the toe. Do not wear sandals. If you use a stepladder: Make sure that it is fully opened. Do not climb a closed stepladder. Make sure that both sides of the stepladder are locked into place. Ask someone to hold it for you, if possible. Clearly mark and make sure that you can see: Any grab bars or handrails. First and last steps. Where the edge of each step is. Use tools that help you move around (mobility aids)  if they are needed. These include: Canes. Walkers. Scooters. Crutches. Turn on the lights when you go into a dark area. Replace any light bulbs as soon as they burn out. Set up your furniture so you have a clear path. Avoid moving your furniture around. If any of your floors are uneven, fix them. If there are any pets around you, be aware of where they are. Review your medicines with your doctor. Some medicines can make you feel dizzy. This can increase your chance of falling. Ask your doctor what other things that you can do to help prevent falls. This information is not intended to replace advice given to you by your health care provider. Make sure you discuss any questions you have with your health care provider. Document Released: 04/26/2009 Document Revised: 12/06/2015 Document Reviewed: 08/04/2014 Elsevier Interactive Patient Education  2017 [employer].

## 2021-05-27 DIAGNOSIS — W19XXXA Unspecified fall, initial encounter: Secondary | ICD-10-CM | POA: Diagnosis not present

## 2021-05-27 DIAGNOSIS — M25532 Pain in left wrist: Secondary | ICD-10-CM | POA: Diagnosis not present

## 2021-06-05 ENCOUNTER — Encounter: Payer: Self-pay | Admitting: Adult Health

## 2021-06-05 DIAGNOSIS — Z1231 Encounter for screening mammogram for malignant neoplasm of breast: Secondary | ICD-10-CM | POA: Diagnosis not present

## 2021-06-11 DIAGNOSIS — L821 Other seborrheic keratosis: Secondary | ICD-10-CM | POA: Diagnosis not present

## 2021-06-11 DIAGNOSIS — D1801 Hemangioma of skin and subcutaneous tissue: Secondary | ICD-10-CM | POA: Diagnosis not present

## 2021-06-11 DIAGNOSIS — L814 Other melanin hyperpigmentation: Secondary | ICD-10-CM | POA: Diagnosis not present

## 2021-06-11 DIAGNOSIS — L738 Other specified follicular disorders: Secondary | ICD-10-CM | POA: Diagnosis not present

## 2021-06-18 ENCOUNTER — Encounter: Payer: Self-pay | Admitting: Adult Health

## 2021-07-04 LAB — HM DEXA SCAN: HM Dexa Scan: NORMAL

## 2021-07-10 ENCOUNTER — Encounter: Payer: Self-pay | Admitting: Adult Health

## 2021-07-10 NOTE — Progress Notes (Signed)
Recent bone Density Abstracted.

## 2021-07-17 ENCOUNTER — Ambulatory Visit (INDEPENDENT_AMBULATORY_CARE_PROVIDER_SITE_OTHER): Payer: PPO

## 2021-07-17 ENCOUNTER — Ambulatory Visit (INDEPENDENT_AMBULATORY_CARE_PROVIDER_SITE_OTHER): Payer: PPO | Admitting: Adult Health

## 2021-07-17 ENCOUNTER — Other Ambulatory Visit: Payer: Self-pay

## 2021-07-17 ENCOUNTER — Encounter: Payer: Self-pay | Admitting: Adult Health

## 2021-07-17 VITALS — BP 138/80 | HR 80 | Temp 98.4°F | Wt 201.0 lb

## 2021-07-17 DIAGNOSIS — M25532 Pain in left wrist: Secondary | ICD-10-CM | POA: Diagnosis not present

## 2021-07-17 NOTE — Progress Notes (Signed)
Subjective:    Patient ID: Julie [name], female    DOB: 10-03-1945, 76 y.o.   MRN: [id number]  HPI  76 year old female who  has a past medical history of ALLERGIC RHINITIS, Anxiety, Arthritis, ARTHRITIS, TRAUMATIC, ANKLE, Cataract, Colitis, Depression, Dyslipidemia, EXOGENOUS OBESITY, GERD (gastroesophageal reflux disease), Hemorrhoids, Hyperglycemia, and Melanoma in situ of left lower leg ([place]).  She presents to the office for a referral to orthopedics   She had a mechanical fall about two months ago while at home, landing on her left hand/wrist. She was seen at [facility] and reports having an xray done but did not show any fracture. She has been wearing a brace that she had at home. Is not using any OTC medication  She continues to have pain along the dorsal aspect of left hand ( between pointer and middle finger). Has weakness and decreased grip strength.    Review of Systems See HPI   Past Medical History:  Diagnosis Date   ALLERGIC RHINITIS    Anxiety    chronic BZs   Arthritis    ARTHRITIS, TRAUMATIC, ANKLE    Cataract    Colitis    Depression    Dyslipidemia    EXOGENOUS OBESITY    GERD (gastroesophageal reflux disease)    Hemorrhoids    Hyperglycemia    Melanoma in situ of left lower leg (HCC)     Social History   Socioeconomic History   Marital status: Widowed    Spouse name: Not on file   Number of children: 2   Years of education: Not on file   Highest education level: Not on file  Occupational History   Occupation: retired  Tobacco Use   Smoking status: Never   Smokeless tobacco: Never  Vaping Use   Vaping Use: Never used  Substance and Sexual Activity   Alcohol use: Yes    Comment: social   Drug use: No   Sexual activity: Not on file  Other Topics Concern   Not on file  Social History Narrative   Widowed (spouse - Ron of >44years prior to death)   widowed 2011/04/20 - 2 grown supportive dtrs, 5 g-kis (3 are triplets)   Social  Determinants of Radio broadcast assistant Strain: Low Risk    Difficulty of Paying Living Expenses: Not hard at all  Food Insecurity: No Food Insecurity   Worried About Charity fundraiser in the Last Year: Never true   Arboriculturist in the Last Year: Never true  Transportation Needs: No Transportation Needs   Lack of Transportation (Medical): No   Lack of Transportation (Non-Medical): No  Physical Activity: Inactive   Days of Exercise per Week: 0 days   Minutes of Exercise per Session: 0 min  Stress: No Stress Concern Present   Feeling of Stress : Not at all  Social Connections: Socially Isolated   Frequency of Communication with Friends and Family: Three times a week   Frequency of Social Gatherings with Friends and Family: Three times a week   Attends Religious Services: Never   Active Member of Clubs or Organizations: No   Attends Archivist Meetings: Never   Marital Status: Widowed  Human resources officer Violence: Not At Risk   Fear of Current or Ex-Partner: No   Emotionally Abused: No   Physically Abused: No   Sexually Abused: No    Past Surgical History:  Procedure Laterality Date   ABDOMINAL HYSTERECTOMY  10/1978   ANKLE ARTHROSCOPY Right 10/19/12   deorio   arthroscopy left knee  05/2002, 04/2010   BREAST SURGERY  11/1988   Benign mass (R) breast   C. surgery history and scan sheet     Cataract surgery     (L) eye 09/06/12 & (R) eye 10/11/12   CHOLECYSTECTOMY  09/2004   cysto sling  10/2006   3 times   FRACTURE SURGERY     right ankle   JOINT REPLACEMENT  06/2010   R ankle   MASS EXCISION     under right arm   removal multiple cyst breast left and right     sling anterior repair  12/1999   SPINE SURGERY     laminotomy with decompression   Toe Exostosis  10/08   Bil   TOTAL ANKLE REPLACEMENT Right     Family History  Problem Relation Age of Onset   Hyperlipidemia Father    Coronary artery disease Father        cabg 65yo   Leukemia Father     Hypertension Father    Heart attack Brother 17       stents x 2 (73 & 39 yo)   Diabetes Brother    Anal fissures Brother    Diabetes Mother    Hyperlipidemia Mother    Hypertension Mother    Colon cancer Neg Hx    Esophageal cancer Neg Hx     Allergies  Allergen Reactions   Hydrocodone-Acetaminophen     REACTION: hives, itiching   Latex     Causes redness and itching   Lisinopril Cough   Oxycodone-Acetaminophen Itching    REACTION: itching    Current Outpatient Medications on File Prior to Visit  Medication Sig Dispense Refill   ALPRAZolam (XANAX) 1 MG tablet TAKE 1 TABLET(1 MG) BY MOUTH TWICE DAILY AS NEEDED FOR ANXIETY 60 tablet 2   aspirin 81 MG tablet Take 81 mg by mouth daily.       atorvastatin (LIPITOR) 20 MG tablet TAKE 1 TABLET(20 MG) BY MOUTH DAILY 90 tablet 3   buPROPion (WELLBUTRIN XL) 300 MG 24 hr tablet TAKE 1 TABLET(300 MG) BY MOUTH DAILY 90 tablet 0   Calcium Carbonate-Vitamin D 600-400 MG-UNIT tablet Take 1 tablet by mouth daily.       losartan (COZAAR) 25 MG tablet TAKE 1 TABLET(25 MG) BY MOUTH DAILY 90 tablet 3   Multiple Vitamin (MULTIVITAMIN) tablet Take 1 tablet by mouth daily.       RA ALLERGY RELIEF 180 MG tablet take 1 tablet by mouth once daily 30 tablet 5   No current facility-administered medications on file prior to visit.    BP 138/80 (BP Location: Right Arm, Patient Position: Sitting, Cuff Size: Normal)    Pulse 80    Temp 98.4 F (36.9 C) (Oral)    Wt 201 lb (91.2 kg)    SpO2 98%    BMI 30.56 kg/m       Objective:   Physical Exam Vitals and nursing note reviewed.  Constitutional:      Appearance: Normal appearance.  Musculoskeletal:        General: Tenderness present.     Left wrist: Swelling present. No bony tenderness, snuff box tenderness or crepitus. Normal range of motion.     Left hand: Swelling, tenderness and bony tenderness present. Decreased range of motion. Decreased strength of finger abduction. Normal capillary refill.  Normal pulse.     Comments: Has swelling and  tenderness over the trapezium and capitate bones of the left hand.  She is able to move all of her digits but has some difficulty with movement of urine ring finger.  Skin:    General: Skin is warm and dry.     Capillary Refill: Capillary refill takes less than 2 seconds.  Neurological:     General: No focal deficit present.     Mental Status: She is alert and oriented to person, place, and time.      Assessment & Plan:   1. Left wrist pain -Repeat x-rays in the office today.  Referral to orthopedics.  Consider advanced imaging depending on what x-rays show.  Advise anti-inflammatories and ice as needed. - DG Hand Complete Left; Future - DG Wrist Complete Left; Future - AMB referral to orthopedics  Dorothyann Peng, NP

## 2021-07-23 ENCOUNTER — Ambulatory Visit: Payer: PPO | Admitting: Orthopedic Surgery

## 2021-07-23 ENCOUNTER — Encounter: Payer: Self-pay | Admitting: Orthopedic Surgery

## 2021-07-23 ENCOUNTER — Other Ambulatory Visit: Payer: Self-pay

## 2021-07-23 DIAGNOSIS — M79642 Pain in left hand: Secondary | ICD-10-CM | POA: Diagnosis not present

## 2021-07-23 MED ORDER — MELOXICAM 7.5 MG PO TABS: 7.5000 mg | ORAL_TABLET | Freq: Every day | ORAL | 0 refills | Status: DC

## 2021-07-23 NOTE — Progress Notes (Signed)
Office Visit Note   Patient: Julie [name]           Date of Birth: 1945/11/28           MRN: [id number] Visit Date: 07/23/2021              Requested by: Dorothyann Peng, NP [address] [place],  [place] [address] PCP: Dorothyann Peng, NP   Assessment & Plan: Visit Diagnoses:  1. Pain in left hand     Plan: Discussed with patient that her injury seems to be localized to the index and long finger at the MP joint.  She may have had a injury to the sagittal band or collateral ligament given the location of her symptoms and associated swelling.  She has no instability of these fingers.  She is able to extend the finger against resistance and from a flexed position suggesting that the sagittal band is still functional.  We discussed continue to work on range of motion with either home exercise program or formal therapy.  She denies she is in formal therapy at this point.  We also try a short course of an anti-inflammatory to try to improve her pain and swelling.  I can see her back in 6 weeks if she still having trouble.  Follow-Up Instructions: No follow-ups on file.   Orders:  No orders of the defined types were placed in this encounter.  No orders of the defined types were placed in this encounter.     Procedures: No procedures performed   Clinical Data: No additional findings.   Subjective: Chief Complaint  Patient presents with   Left Hand - New Patient (Initial Visit)    Is a 76 year old right-hand-dominant female who presents with left hand pain since a ground-level fall in November of this year.  She notes pain at the index and middle finger around the MP joint and proximal phalanx.  She initially was unable to abduct these fingers or make a fist.  She has since noticed significant improvement in range of motion of these fingers as well as improvement of her swelling.  She still swelling at the intermetacarpal valley between the index and middle finger MP  joints.  She wore a brace Pierce[place] but has since discarded it.  She not take any medications at this point.  She denies any numbness or paresthesias.  She overall think she is improving but not as quickly as she would have hoped.   Review of Systems   Objective: Vital Signs: BP 121/74 (BP Location: Right Arm, Patient Position: Sitting, Cuff Size: Large)    Pulse 83    Ht 5' 7.5" (1.715 m)    Wt 201 lb 12.8 oz (91.5 kg)    SpO2 96%    BMI 31.14 kg/m   Physical Exam Constitutional:      Appearance: Normal appearance.  Cardiovascular:     Rate and Rhythm: Normal rate.     Pulses: Normal pulses.  Pulmonary:     Effort: Pulmonary effort is normal.  Skin:    General: Skin is warm and dry.     Capillary Refill: Capillary refill takes less than 2 seconds.  Neurological:     Mental Status: She is alert.    Right Hand Exam   Tenderness  Right hand tenderness location: TTP at index and finger MP joints w/ moderate swelling.  Other  Erythema: absent Sensation: normal Pulse: present  Comments:  Lacks 1-2 cm from palm with flexion of  index and middle fingers.  Able to fully extend fingers from flexed position and against resistance.  No subluxation or instability of extensor mechanism.      Specialty Comments:  No specialty comments available.  Imaging: Multiple views of both the left hand and wrist taken several days ago are reviewed interpreted by me today.  They do not demonstrate any acute bony injury.  There is no evidence of subluxation or instability at the index or middle finger MP joints.  There is no evidence of fracture of these fingers.  She has well-maintained joint spaces at both the MP and IP joints of these fingers.   PMFS History: Patient Active Problem List   Diagnosis Date Noted   Pain in left hand 07/23/2021   Insomnia 12/16/2017   OSA (obstructive sleep apnea) 12/16/2017   Essential hypertension 03/27/2016   Hyperglycemia 02/03/2012   Dyslipidemia  06/30/2011   ARTHRITIS, TRAUMATIC, ANKLE 01/01/2010   EXOGENOUS OBESITY 12/26/2008   ANXIETY DEPRESSION 12/02/2007   ALLERGIC RHINITIS 04/08/2007   GERD 04/08/2007   Past Medical History:  Diagnosis Date   ALLERGIC RHINITIS    Anxiety    chronic BZs   Arthritis    ARTHRITIS, TRAUMATIC, ANKLE    Cataract    Colitis    Depression    Dyslipidemia    EXOGENOUS OBESITY    GERD (gastroesophageal reflux disease)    Hemorrhoids    Hyperglycemia    Melanoma in situ of left lower leg ([place])     Family History  Problem Relation Age of Onset   Hyperlipidemia Father    Coronary artery disease Father        cabg 78yo   Leukemia Father    Hypertension Father    Heart attack Brother 59       stents x 2 (39 & 25 yo)   Diabetes Brother    Anal fissures Brother    Diabetes Mother    Hyperlipidemia Mother    Hypertension Mother    Colon cancer Neg Hx    Esophageal cancer Neg Hx     Past Surgical History:  Procedure Laterality Date   ABDOMINAL HYSTERECTOMY  10/1978   ANKLE ARTHROSCOPY Right 10/19/12   deorio   arthroscopy left knee  05/2002, 04/2010   BREAST SURGERY  11/1988   Benign mass (R) breast   C. surgery history and scan sheet     Cataract surgery     (L) eye 09/06/12 & (R) eye 10/11/12   CHOLECYSTECTOMY  09/2004   cysto sling  10/2006   3 times   FRACTURE SURGERY     right ankle   JOINT REPLACEMENT  06/2010   R ankle   MASS EXCISION     under right arm   removal multiple cyst breast left and right     sling anterior repair  12/1999   SPINE SURGERY     laminotomy with decompression   Toe Exostosis  10/08   Bil   TOTAL ANKLE REPLACEMENT Right    Social History   Occupational History   Occupation: retired  Tobacco Use   Smoking status: Never   Smokeless tobacco: Never  Vaping Use   Vaping Use: Never used  Substance and Sexual Activity   Alcohol use: Yes    Comment: social   Drug use: No   Sexual activity: Not on file

## 2021-07-24 ENCOUNTER — Encounter: Payer: Self-pay | Admitting: Adult Health

## 2021-08-25 ENCOUNTER — Other Ambulatory Visit: Payer: Self-pay | Admitting: Orthopedic Surgery

## 2021-09-03 ENCOUNTER — Ambulatory Visit (INDEPENDENT_AMBULATORY_CARE_PROVIDER_SITE_OTHER): Payer: PPO | Admitting: Orthopedic Surgery

## 2021-09-03 ENCOUNTER — Other Ambulatory Visit: Payer: Self-pay

## 2021-09-03 DIAGNOSIS — M79642 Pain in left hand: Secondary | ICD-10-CM

## 2021-09-03 NOTE — Progress Notes (Signed)
Office Visit Note   Patient: Julie [name]           Date of Birth: 1946-03-03           MRN: [id number] Visit Date: 09/03/2021              Requested by: Dorothyann Peng, NP [address] [place],  [place] [address] PCP: Dorothyann Peng, NP   Assessment & Plan: Visit Diagnoses:  1. Pain in left hand     Plan: Patient continues to have pain and somewhat limited range of motion in this left index and middle finger MP joint.  Her pain seems to be directly over the MP joints.  Her previous x-rays have been unremarkable.  She has tried oral meloxicam and a home exercise program with minimal symptom relief.  We talked about a formal therapy visit but she does not seem interested at this point.  We will get an MRI of her hand to further evaluate her index and middle finger MP joints.  Follow-Up Instructions: No follow-ups on file.   Orders:  No orders of the defined types were placed in this encounter.  No orders of the defined types were placed in this encounter.     Procedures: No procedures performed   Clinical Data: No additional findings.   Subjective: Chief Complaint  Patient presents with   Left Hand - Pain, Follow-up    Is a 76 year old right-handhand-dominant female who presents for follow up of left hand pain since a ground-level fall in November.  X-rays taken at the time of injury and several months later both without evidence of fracture or dislocation.  Most of her pain is at the level of the MP joint of the index and middle finger.  There are some associated swelling.  She has some swelling at the intermetacarpal valley but does not have any pain in this area.  She also is concerned about her limited range of motion at the MP joints.  Her swelling seems to come and go.  She was doing some home exercise programs without much notable improvement in range of motion.    Review of Systems   Objective: Vital Signs: There were no vitals taken for this  visit.  Physical Exam Constitutional:      Appearance: Normal appearance.  Cardiovascular:     Rate and Rhythm: Normal rate.     Pulses: Normal pulses.  Pulmonary:     Effort: Pulmonary effort is normal.  Skin:    General: Skin is warm and dry.     Capillary Refill: Capillary refill takes less than 2 seconds.  Neurological:     Mental Status: She is alert.    Left Hand Exam   Tenderness  Left hand tenderness location: TTP directly over index and middle finger MP joints w/ diffuse swelling from index MP through intermetacarpal valley and over middle finger MP joint.   Comments:  ROM of index and middle finger MP joint is 0/60 and 0/70 degrees.  Moderate swelling.  No instability at MP joint. Extensor tendons are central.  She is able to full extend fingers both without and against resistance.      Specialty Comments:  No specialty comments available.  Imaging: No results found.   PMFS History: Patient Active Problem List   Diagnosis Date Noted   Pain in left hand 07/23/2021   Insomnia 12/16/2017   OSA (obstructive sleep apnea) 12/16/2017   Essential hypertension 03/27/2016   Hyperglycemia 02/03/2012  Dyslipidemia 06/30/2011   ARTHRITIS, TRAUMATIC, ANKLE 01/01/2010   EXOGENOUS OBESITY 12/26/2008   ANXIETY DEPRESSION 12/02/2007   ALLERGIC RHINITIS 04/08/2007   GERD 04/08/2007   Past Medical History:  Diagnosis Date   ALLERGIC RHINITIS    Anxiety    chronic BZs   Arthritis    ARTHRITIS, TRAUMATIC, ANKLE    Cataract    Colitis    Depression    Dyslipidemia    EXOGENOUS OBESITY    GERD (gastroesophageal reflux disease)    Hemorrhoids    Hyperglycemia    Melanoma in situ of left lower leg ([facility])     Family History  Problem Relation Age of Onset   Hyperlipidemia Father    Coronary artery disease Father        cabg 66yo   Leukemia Father    Hypertension Father    Heart attack Brother 49       stents x 2 (37 & 56 yo)   Diabetes Brother    Anal  fissures Brother    Diabetes Mother    Hyperlipidemia Mother    Hypertension Mother    Colon cancer Neg Hx    Esophageal cancer Neg Hx     Past Surgical History:  Procedure Laterality Date   ABDOMINAL HYSTERECTOMY  10/1978   ANKLE ARTHROSCOPY Right 10/19/12   deorio   arthroscopy left knee  05/2002, 04/2010   BREAST SURGERY  11/1988   Benign mass (R) breast   C. surgery history and scan sheet     Cataract surgery     (L) eye 09/06/12 & (R) eye 10/11/12   CHOLECYSTECTOMY  09/2004   cysto sling  10/2006   3 times   FRACTURE SURGERY     right ankle   JOINT REPLACEMENT  06/2010   R ankle   MASS EXCISION     under right arm   removal multiple cyst breast left and right     sling anterior repair  12/1999   SPINE SURGERY     laminotomy with decompression   Toe Exostosis  10/08   Bil   TOTAL ANKLE REPLACEMENT Right    Social History   Occupational History   Occupation: retired  Tobacco Use   Smoking status: Never   Smokeless tobacco: Never  Vaping Use   Vaping Use: Never used  Substance and Sexual Activity   Alcohol use: Yes    Comment: social   Drug use: No   Sexual activity: Not on file

## 2021-09-04 ENCOUNTER — Other Ambulatory Visit: Payer: Self-pay | Admitting: Adult Health

## 2021-09-04 DIAGNOSIS — F32A Depression, unspecified: Secondary | ICD-10-CM

## 2021-09-04 DIAGNOSIS — F419 Anxiety disorder, unspecified: Secondary | ICD-10-CM

## 2021-09-06 NOTE — Telephone Encounter (Signed)
Okay for refill?    LOV 07/17/2021   Last refilled  ALPRAZolam (XANAX) 1 MG tablet 60 tablet 2 01/25/2021

## 2021-09-13 ENCOUNTER — Other Ambulatory Visit: Payer: Self-pay

## 2021-09-13 ENCOUNTER — Ambulatory Visit
Admission: RE | Admit: 2021-09-13 | Discharge: 2021-09-13 | Disposition: A | Discharge status: Home / Self Care | Payer: PPO | Source: Ambulatory Visit | Attending: Orthopedic Surgery | Admitting: Orthopedic Surgery

## 2021-09-13 DIAGNOSIS — M79642 Pain in left hand: Secondary | ICD-10-CM

## 2021-09-16 ENCOUNTER — Other Ambulatory Visit: Payer: Self-pay

## 2021-09-16 ENCOUNTER — Ambulatory Visit: Payer: PPO | Admitting: Orthopedic Surgery

## 2021-09-16 ENCOUNTER — Encounter: Payer: Self-pay | Admitting: Orthopedic Surgery

## 2021-09-16 DIAGNOSIS — M79642 Pain in left hand: Secondary | ICD-10-CM

## 2021-09-16 MED ORDER — LIDOCAINE HCL 1 % IJ SOLN
1.0000 mL | INTRAMUSCULAR | Status: AC | PRN
  Administered 2021-09-16: 1 mL

## 2021-09-16 MED ORDER — BETAMETHASONE SOD PHOS & ACET 6 (3-3) MG/ML IJ SUSP
6.0000 mg | INTRAMUSCULAR | Status: AC | PRN
  Administered 2021-09-16: 6 mg via INTRA_ARTICULAR

## 2021-09-16 NOTE — Progress Notes (Signed)
? ?Office Visit Note ?  ?Patient: Julie [name]           ?Date of Birth: 10-03-1945           ?MRN: [id number] ?Visit Date: 09/16/2021 ?             ?Requested by: Dorothyann Peng, NP ?[address] ?[place],  [place] [address] ?PCP: Dorothyann Peng, NP ? ? ?Assessment & Plan: ?Visit Diagnoses:  ?1. Pain in left hand   ? ? ?Plan: We reviewed the results of her MRI which demonstrated tenosynovitis with mild tendinosis of both the index and middle finger flexor tendons.  On exam today she does have a palpable nodule at the middle finger over the A1 pulley which could be suggestive of a trigger finger.  We discussed different treatment options including corticosteroid junction into the index and middle finger A1 pulleys.  We will proceed with this today.  I will see her back in 4 months to see if she is made any progress. ? ?Follow-Up Instructions: No follow-ups on file.  ? ?Orders:  ?No orders of the defined types were placed in this encounter. ? ?No orders of the defined types were placed in this encounter. ? ? ? ? Procedures: ?Hand/UE Inj: L long A1 for trigger finger on 09/16/2021 10:21 AM ?Indications: therapeutic and tendon swelling ?Details: 25 G needle, volar approach ?Medications: 1 mL lidocaine 1 %; 6 mg betamethasone acetate-betamethasone sodium phosphate 6 (3-3) MG/ML ?Outcome: tolerated well, no immediate complications ?Procedure, treatment alternatives, risks and benefits explained, specific risks discussed. Consent was given by the patient. Immediately prior to procedure a time out was called to verify the correct patient, procedure, equipment, support staff and site/side marked as required. Patient was prepped and draped in the usual sterile fashion.  ? ? ?Hand/UE Inj: L index A1 for trigger finger on 09/16/2021 10:22 AM ?Indications: therapeutic and tendon swelling ?Details: 25 G needle, volar approach ?Medications: 1 mL lidocaine 1 %; 6 mg betamethasone acetate-betamethasone sodium phosphate 6  (3-3) MG/ML ?Outcome: tolerated well, no immediate complications ?Procedure, treatment alternatives, risks and benefits explained, specific risks discussed. Consent was given by the patient. Immediately prior to procedure a time out was called to verify the correct patient, procedure, equipment, support staff and site/side marked as required.  ? ? ? ? ?Clinical Data: ?No additional findings. ? ? ?Subjective: ?Chief Complaint  ?Patient presents with  ? Left Hand - Follow-up  ? ? ?This a 76 year old right-hand-dominant female who presents for follow up of left hand pain since a ground-level fall in November.  X-rays at that time were negative for fracture.  She has had pain with limited flexion of the index and middle fingers.  She is tried oral meloxicam with minimal symptom relief.  She recently underwent MRI of the hand to further evaluate.  MRI suggested mild tenosynovitis and tendinosis of the index and middle finger FDP and FDS tendons.  She has not tried formal hand therapy to work on range of motion as she was not interested at her previous visit. ? ? ? ?Review of Systems ? ? ?Objective: ?Vital Signs: There were no vitals taken for this visit. ? ?Physical Exam ? ?Left Hand Exam  ? ?Tenderness  ?Left hand tenderness location: TTP at middle and index finger A1 pulleys.  ? ?Other  ?Erythema: absent ?Sensation: normal ?Pulse: present ? ?Comments:  Near full passive flexion of the index and middle fingers with palpable nodule at the middle finger A1  pulley.  Lacks approximately 1 cm from [facility] with active flexion of these fingers.   ? ? ? ? ?Specialty Comments:  ?No specialty comments available. ? ?Imaging: ?No results found. ? ? ?PMFS History: ?Patient Active Problem List  ? Diagnosis Date Noted  ? Pain in left hand 07/23/2021  ? Insomnia 12/16/2017  ? OSA (obstructive sleep apnea) 12/16/2017  ? Essential hypertension 03/27/2016  ? Hyperglycemia 02/03/2012  ? Dyslipidemia 06/30/2011  ? ARTHRITIS, TRAUMATIC, ANKLE  01/01/2010  ? EXOGENOUS OBESITY 12/26/2008  ? ANXIETY DEPRESSION 12/02/2007  ? ALLERGIC RHINITIS 04/08/2007  ? GERD 04/08/2007  ? ?Past Medical History:  ?Diagnosis Date  ? ALLERGIC RHINITIS   ? Anxiety   ? chronic BZs  ? Arthritis   ? ARTHRITIS, TRAUMATIC, ANKLE   ? Cataract   ? Colitis   ? Depression   ? Dyslipidemia   ? EXOGENOUS OBESITY   ? GERD (gastroesophageal reflux disease)   ? Hemorrhoids   ? Hyperglycemia   ? Melanoma in situ of left lower leg ([place])   ?  ?Family History  ?Problem Relation Age of Onset  ? Hyperlipidemia Father   ? Coronary artery disease Father   ?     cabg 43yo  ? Leukemia Father   ? Hypertension Father   ? Heart attack Brother 6  ?     stents x 2 (79 & 43 yo)  ? Diabetes Brother   ? Anal fissures Brother   ? Diabetes Mother   ? Hyperlipidemia Mother   ? Hypertension Mother   ? Colon cancer Neg Hx   ? Esophageal cancer Neg Hx   ?  ?Past Surgical History:  ?Procedure Laterality Date  ? ABDOMINAL HYSTERECTOMY  10/1978  ? ANKLE ARTHROSCOPY Right 10/19/12  ? deorio  ? arthroscopy left knee  05/2002, 04/2010  ? BREAST SURGERY  11/1988  ? Benign mass (R) breast  ? C. surgery history and scan sheet    ? Cataract surgery    ? (L) eye 09/06/12 & (R) eye 10/11/12  ? CHOLECYSTECTOMY  09/2004  ? cysto sling  10/2006  ? 3 times  ? FRACTURE SURGERY    ? right ankle  ? JOINT REPLACEMENT  06/2010  ? R ankle  ? MASS EXCISION    ? under right arm  ? removal multiple cyst breast left and right    ? sling anterior repair  12/1999  ? SPINE SURGERY    ? laminotomy with decompression  ? Toe Exostosis  10/08  ? Bil  ? TOTAL ANKLE REPLACEMENT Right   ? ?Social History  ? ?Occupational History  ? Occupation: retired  ?Tobacco Use  ? Smoking status: Never  ? Smokeless tobacco: Never  ?Vaping Use  ? Vaping Use: Never used  ?Substance and Sexual Activity  ? Alcohol use: Yes  ?  Comment: social  ? Drug use: No  ? Sexual activity: Not on file  ? ? ? ? ? ? ?

## 2021-10-15 ENCOUNTER — Ambulatory Visit: Payer: PPO | Admitting: Orthopedic Surgery

## 2021-10-15 DIAGNOSIS — M659 Synovitis and tenosynovitis, unspecified: Secondary | ICD-10-CM | POA: Diagnosis not present

## 2021-10-15 NOTE — Progress Notes (Signed)
? ?Office Visit Note ?  ?Patient: Julie [name]           ?Date of Birth: 12/27/1945           ?MRN: [id number] ?Visit Date: 10/15/2021 ?             ?Requested by: Dorothyann Peng, NP ?[address] ?[place],  [place] [id number] ?PCP: Dorothyann Peng, NP ? ? ?Assessment & Plan: ?Visit Diagnoses:  ?1. Flexor tenosynovitis of finger   ? ? ?Plan: Patient is now 4 weeks out from a corticosteroid junction into the left index and middle fingers at the A1 pulley.  She still has limited active range of motion of these fingers.  We again reviewed her MRI which showed mild tenosynovitis of the index and middle fingers.  She has minimal pain in these fingers but limited range of motion.  We discussed formal hand therapy to try to work on range of motion versus surgical exploration with possible tenosynovectomy.  She would like to continue to monitor her symptoms for now and will call the office or return if she would like to pursue therapy or surgery. ? ?Follow-Up Instructions: No follow-ups on file.  ? ?Orders:  ?No orders of the defined types were placed in this encounter. ? ?No orders of the defined types were placed in this encounter. ? ? ? ? Procedures: ?No procedures performed ? ? ?Clinical Data: ?No additional findings. ? ? ?Subjective: ?Chief Complaint  ?Patient presents with  ? Left Middle Finger - Follow-up  ? Left Index Finger - Follow-up  ? ? ?HPI from previous note: "This a 76 year old right-hand-dominant female who presents for follow up of left hand pain since a ground-level fall in November.  X-rays at that time were negative for fracture.  She has had pain with limited flexion of the index and middle fingers.  She is tried oral meloxicam with minimal symptom relief.  She recently underwent MRI of the hand to further evaluate.  MRI suggested mild tenosynovitis and tendinosis of the index and middle finger FDP and FDS tendons." ? ?She is not 4 weeks out from CSI into the left index and middle finger flexor  tendon sheaths.  She thinks that she has improved very slightly.  She still has difficulty with making a fist with these two fingers.  She has some poorly localized pain in these fingers. She has not tried formal hand therapy to work on ROM.  ? ? ?Review of Systems ? ? ?Objective: ?Vital Signs: There were no vitals taken for this visit. ? ?Physical Exam ?Constitutional:   ?   Appearance: Normal appearance.  ?Cardiovascular:  ?   Rate and Rhythm: Normal rate.  ?   Pulses: Normal pulses.  ?Pulmonary:  ?   Effort: Pulmonary effort is normal.  ?Skin: ?   General: Skin is warm and dry.  ?   Capillary Refill: Capillary refill takes less than 2 seconds.  ?Neurological:  ?   Mental Status: She is alert.  ? ? ?Left Hand Exam  ? ?Tenderness  ?Left hand tenderness location: Minimal TTP at index and middle finger A1 pulleys.  ? ?Other  ?Erythema: absent ?Sensation: normal ?Pulse: present ? ?Comments:  Lacks approx 1 cm from the [facility] with active flexion of index and middle fingers.  Full ROM of ring and small fingers.  No palpable triggering.  Mild swelling around A1 pulleys.  ? ? ? ? ?Specialty Comments:  ?No specialty comments available. ? ?Imaging: ?No results  found. ? ? ?PMFS History: ?Patient Active Problem List  ? Diagnosis Date Noted  ? Flexor tenosynovitis of finger 10/15/2021  ? Pain in left hand 07/23/2021  ? Insomnia 12/16/2017  ? OSA (obstructive sleep apnea) 12/16/2017  ? Essential hypertension 03/27/2016  ? Hyperglycemia 02/03/2012  ? Dyslipidemia 06/30/2011  ? ARTHRITIS, TRAUMATIC, ANKLE 01/01/2010  ? EXOGENOUS OBESITY 12/26/2008  ? ANXIETY DEPRESSION 12/02/2007  ? ALLERGIC RHINITIS 04/08/2007  ? GERD 04/08/2007  ? ?Past Medical History:  ?Diagnosis Date  ? ALLERGIC RHINITIS   ? Anxiety   ? chronic BZs  ? Arthritis   ? ARTHRITIS, TRAUMATIC, ANKLE   ? Cataract   ? Colitis   ? Depression   ? Dyslipidemia   ? EXOGENOUS OBESITY   ? GERD (gastroesophageal reflux disease)   ? Hemorrhoids   ? Hyperglycemia   ? Melanoma in  situ of left lower leg ([place])   ?  ?Family History  ?Problem Relation Age of Onset  ? Hyperlipidemia Father   ? Coronary artery disease Father   ?     cabg 36yo  ? Leukemia Father   ? Hypertension Father   ? Heart attack Brother 22  ?     stents x 2 (76 & 43 yo)  ? Diabetes Brother   ? Anal fissures Brother   ? Diabetes Mother   ? Hyperlipidemia Mother   ? Hypertension Mother   ? Colon cancer Neg Hx   ? Esophageal cancer Neg Hx   ?  ?Past Surgical History:  ?Procedure Laterality Date  ? ABDOMINAL HYSTERECTOMY  10/1978  ? ANKLE ARTHROSCOPY Right 10/19/12  ? deorio  ? arthroscopy left knee  05/2002, 04/2010  ? BREAST SURGERY  11/1988  ? Benign mass (R) breast  ? C. surgery history and scan sheet    ? Cataract surgery    ? (L) eye 09/06/12 & (R) eye 10/11/12  ? CHOLECYSTECTOMY  09/2004  ? cysto sling  10/2006  ? 3 times  ? FRACTURE SURGERY    ? right ankle  ? JOINT REPLACEMENT  06/2010  ? R ankle  ? MASS EXCISION    ? under right arm  ? removal multiple cyst breast left and right    ? sling anterior repair  12/1999  ? SPINE SURGERY    ? laminotomy with decompression  ? Toe Exostosis  10/08  ? Bil  ? TOTAL ANKLE REPLACEMENT Right   ? ?Social History  ? ?Occupational History  ? Occupation: retired  ?Tobacco Use  ? Smoking status: Never  ? Smokeless tobacco: Never  ?Vaping Use  ? Vaping Use: Never used  ?Substance and Sexual Activity  ? Alcohol use: Yes  ?  Comment: social  ? Drug use: No  ? Sexual activity: Not on file  ? ? ? ? ? ? ?

## 2021-11-23 ENCOUNTER — Other Ambulatory Visit: Payer: Self-pay | Admitting: Adult Health

## 2021-11-23 DIAGNOSIS — F419 Anxiety disorder, unspecified: Secondary | ICD-10-CM

## 2021-12-27 ENCOUNTER — Ambulatory Visit (INDEPENDENT_AMBULATORY_CARE_PROVIDER_SITE_OTHER): Payer: PPO | Admitting: Adult Health

## 2021-12-27 ENCOUNTER — Encounter: Payer: Self-pay | Admitting: Adult Health

## 2021-12-27 VITALS — BP 110/68 | HR 85 | Temp 98.5°F | Ht 67.5 in | Wt 204.0 lb

## 2021-12-27 DIAGNOSIS — K219 Gastro-esophageal reflux disease without esophagitis: Secondary | ICD-10-CM

## 2021-12-27 DIAGNOSIS — R1310 Dysphagia, unspecified: Secondary | ICD-10-CM

## 2021-12-27 DIAGNOSIS — F419 Anxiety disorder, unspecified: Secondary | ICD-10-CM | POA: Diagnosis not present

## 2021-12-27 DIAGNOSIS — F32A Depression, unspecified: Secondary | ICD-10-CM | POA: Diagnosis not present

## 2021-12-27 MED ORDER — ESCITALOPRAM OXALATE 20 MG PO TABS: 20.0000 mg | ORAL_TABLET | Freq: Every day | ORAL | 0 refills | Status: DC

## 2021-12-27 MED ORDER — SUCRALFATE 1 G PO TABS
1.0000 g | ORAL_TABLET | Freq: Three times a day (TID) | ORAL | 0 refills | Status: DC
Start: 2021-12-27 — End: 2022-01-29

## 2021-12-27 MED ORDER — PANTOPRAZOLE SODIUM 40 MG PO TBEC: 40.0000 mg | DELAYED_RELEASE_TABLET | Freq: Every day | ORAL | 3 refills | Status: DC

## 2021-12-27 NOTE — Progress Notes (Signed)
Subjective:    Patient ID: Julie [name], female    DOB: [birth year], 76 y.o.   MRN: [id number]  HPI 76 year old female who  has a past medical history of ALLERGIC RHINITIS, Anxiety, Arthritis, ARTHRITIS, TRAUMATIC, ANKLE, Cataract, Colitis, Depression, Dyslipidemia, EXOGENOUS OBESITY, GERD (gastroesophageal reflux disease), Hemorrhoids, Hyperglycemia, and Melanoma in situ of left lower leg ([place]).  She presents to the office today for multiple issues   First issue is that of epigastric pain.  Symptoms possibly present for the last 2 to 3 months.  She reports that over the last 2 to 3 months she has been having epigastric pain that is worse after eating.  She has been "eating Tums like candy" which helped for short period of time.  She finds when she lays down at night the symptoms worsen.  About 3 weeks ago she started having a nonproductive cough which she thought was from a cold.  She cannot seem to shake the cough despite using over-the-counter medications.  She also reports sensation of food and drink getting stuck when she swallows.  This has been an ongoing issue for quite some time.  Has been told in the past that she likely needs to have "her esophagus stretched". Denies chest pain, shortness of breath, fevers, or chills   Additionally,  she feels as though her anxiety and depression are not well controlled.  Currently prescribed Xanax 1 mg BID Wellbutrin 300 mg extended release daily.  She reports "feeling tightly wound the last 75 years"  and anxious not enjoying things that she once did.  Review of Systems See HPI   Past Medical History:  Diagnosis Date   ALLERGIC RHINITIS    Anxiety    chronic BZs   Arthritis    ARTHRITIS, TRAUMATIC, ANKLE    Cataract    Colitis    Depression    Dyslipidemia    EXOGENOUS OBESITY    GERD (gastroesophageal reflux disease)    Hemorrhoids    Hyperglycemia    Melanoma in situ of left lower leg ([facility])     Social History   Socioeconomic  History   Marital status: Widowed    Spouse name: Not on file   Number of children: 2   Years of education: Not on file   Highest education level: Not on file  Occupational History   Occupation: retired  Tobacco Use   Smoking status: Never   Smokeless tobacco: Never  Vaping Use   Vaping Use: Never used  Substance and Sexual Activity   Alcohol use: Yes    Comment: social   Drug use: No   Sexual activity: Not on file  Other Topics Concern   Not on file  Social History Narrative   Widowed (spouse - Ron of >44years prior to death)   widowed Apr 01, 2011 - 2 grown supportive dtrs, 5 g-kis (3 are triplets)   Social Determinants of Health   Financial Resource Strain: Low Risk  (03/08/2021)   Overall Financial Resource Strain (CARDIA)    Difficulty of Paying Living Expenses: Not hard at all  Food Insecurity: No Food Insecurity (03/08/2021)   Hunger Vital Sign    Worried About Running Out of Food in the Last Year: Never true    [place] in the Last Year: Never true  Transportation Needs: No Transportation Needs (03/08/2021)   PRAPARE - Hydrologist (Medical): No    Lack of Transportation (Non-Medical): No  Physical Activity:  Inactive (03/08/2021)   Exercise Vital Sign    Days of Exercise per Week: 0 days    Minutes of Exercise per Session: 0 min  Stress: No Stress Concern Present (03/08/2021)   [place]    Feeling of Stress : Not at all  Social Connections: Socially Isolated (03/08/2021)   Social Connection and Isolation Panel [NHANES]    Frequency of Communication with Friends and Family: Three times a week    Frequency of Social Gatherings with Friends and Family: Three times a week    Attends Religious Services: Never    Active Member of Clubs or Organizations: No    Attends Archivist Meetings: Never    Marital Status: Widowed  Intimate Partner Violence: Not At Risk  (03/08/2021)   Humiliation, Afraid, Rape, and Kick questionnaire    Fear of Current or Ex-Partner: No    Emotionally Abused: No    Physically Abused: No    Sexually Abused: No    Past Surgical History:  Procedure Laterality Date   ABDOMINAL HYSTERECTOMY  10/1978   ANKLE ARTHROSCOPY Right 10/19/12   deorio   arthroscopy left knee  05/2002, 04/2010   BREAST SURGERY  11/1988   Benign mass (R) breast   C. surgery history and scan sheet     Cataract surgery     (L) eye 09/06/12 & (R) eye 10/11/12   CHOLECYSTECTOMY  09/2004   cysto sling  10/2006   3 times   FRACTURE SURGERY     right ankle   JOINT REPLACEMENT  06/2010   R ankle   MASS EXCISION     under right arm   removal multiple cyst breast left and right     sling anterior repair  12/1999   SPINE SURGERY     laminotomy with decompression   Toe Exostosis  10/08   Bil   TOTAL ANKLE REPLACEMENT Right     Family History  Problem Relation Age of Onset   Hyperlipidemia Father    Coronary artery disease Father        cabg 56yo   Leukemia Father    Hypertension Father    Heart attack Brother 53       stents x 2 (47 & 14 yo)   Diabetes Brother    Anal fissures Brother    Diabetes Mother    Hyperlipidemia Mother    Hypertension Mother    Colon cancer Neg Hx    Esophageal cancer Neg Hx     Allergies  Allergen Reactions   Hydrocodone-Acetaminophen     REACTION: hives, itiching   Latex     Causes redness and itching   Lisinopril Cough   Oxycodone-Acetaminophen Itching    REACTION: itching    Current Outpatient Medications on File Prior to Visit  Medication Sig Dispense Refill   ALPRAZolam (XANAX) 1 MG tablet TAKE 1 TABLET(1 MG) BY MOUTH TWICE DAILY AS NEEDED FOR ANXIETY 60 tablet 2   aspirin 81 MG tablet Take 81 mg by mouth daily.       atorvastatin (LIPITOR) 20 MG tablet TAKE 1 TABLET(20 MG) BY MOUTH DAILY 90 tablet 3   buPROPion (WELLBUTRIN XL) 300 MG 24 hr tablet Take 1 tablet (300 mg total) by mouth daily. 90  tablet 1   Calcium Carbonate-Vitamin D 600-400 MG-UNIT tablet Take 1 tablet by mouth daily.       losartan (COZAAR) 25 MG tablet TAKE 1 TABLET(25 MG) BY  MOUTH DAILY 90 tablet 3   Multiple Vitamin (MULTIVITAMIN) tablet Take 1 tablet by mouth daily.       RA ALLERGY RELIEF 180 MG tablet take 1 tablet by mouth once daily 30 tablet 5   No current facility-administered medications on file prior to visit.    BP 110/68   Pulse 85   Temp 98.5 F (36.9 C) (Oral)   Ht 5' 7.5" (1.715 m)   Wt 204 lb (92.5 kg)   SpO2 98%   BMI 31.48 kg/m       Objective:   Physical Exam Vitals and nursing note reviewed.  Constitutional:      Appearance: Normal appearance.  Cardiovascular:     Rate and Rhythm: Normal rate and regular rhythm.     Pulses: Normal pulses.     Heart sounds: Normal heart sounds.  Pulmonary:     Effort: Pulmonary effort is normal.     Breath sounds: Normal breath sounds.  Abdominal:     General: Abdomen is flat. Bowel sounds are normal.     Palpations: Abdomen is soft. There is no mass.     Tenderness: There is abdominal tenderness in the epigastric area. There is no guarding or rebound.     Hernia: No hernia is present.  Skin:    General: Skin is warm and dry.  Neurological:     Mental Status: She is alert.  Psychiatric:        Mood and Affect: Mood normal.        Behavior: Behavior normal.        Thought Content: Thought content normal.        Judgment: Judgment normal.       Assessment & Plan:  1. Gastroesophageal reflux disease without esophagitis - She was quite tender to epigastric region. Can not r/o PUD. Will start on Protonix and Carafate.  - Follow up in one month to see how she is doing  - pantoprazole (PROTONIX) 40 MG tablet; Take 1 tablet (40 mg total) by mouth daily.  Dispense: 30 tablet; Refill: 3 - sucralfate (CARAFATE) 1 g tablet; Take 1 tablet (1 g total) by mouth 4 (four) times daily -  with meals and at bedtime for 15 days.  Dispense: 60 tablet;  Refill: 0  2. Anxiety and depression - Will start on lexapro 20 mg. Have her follow up in one month. Continue with wellbutrin for the time being  - escitalopram (LEXAPRO) 20 MG tablet; Take 1 tablet (20 mg total) by mouth daily.  Dispense: 30 tablet; Refill: 0  3. Dysphagia, unspecified type  - Ambulatory referral to Gastroenterology  Dorothyann Peng, NP  Time of total for care on the day of the encounter 12/27/2021 minutes. This includes time spent in both face-to-face and non-face-to-face activities including preparing for the visit, reviewing the chart, time spent with patient, evaluation and counseling patient/family/caregiver, coordinating care, and time spent documenting in the chart which was performed on the date of service (12/27/2021). Note: this excludes any time spent performing billable procedures or separate charges (such as time spent counseling smoking cessation); these charges are billed separately.

## 2021-12-27 NOTE — Patient Instructions (Signed)
I have sent in a prescription for Protonix and Carafate to help with your stomach   I have also sent in Lexapro to help with your anxiety and depression   Someone from GI will call you to schedule   Please follow up with me in one month

## 2022-01-29 ENCOUNTER — Ambulatory Visit (INDEPENDENT_AMBULATORY_CARE_PROVIDER_SITE_OTHER): Payer: PPO | Admitting: Adult Health

## 2022-01-29 ENCOUNTER — Encounter: Payer: Self-pay | Admitting: Adult Health

## 2022-01-29 VITALS — BP 128/80 | HR 106 | Temp 98.4°F | Ht 67.5 in | Wt 198.0 lb

## 2022-01-29 DIAGNOSIS — F32A Depression, unspecified: Secondary | ICD-10-CM | POA: Diagnosis not present

## 2022-01-29 DIAGNOSIS — F419 Anxiety disorder, unspecified: Secondary | ICD-10-CM | POA: Diagnosis not present

## 2022-01-29 DIAGNOSIS — F5101 Primary insomnia: Secondary | ICD-10-CM | POA: Diagnosis not present

## 2022-01-29 DIAGNOSIS — K219 Gastro-esophageal reflux disease without esophagitis: Secondary | ICD-10-CM

## 2022-01-29 MED ORDER — ESCITALOPRAM OXALATE 20 MG PO TABS: 20.0000 mg | ORAL_TABLET | Freq: Every day | ORAL | 0 refills | Status: DC

## 2022-01-29 MED ORDER — ALPRAZOLAM 1 MG PO TABS: ORAL_TABLET | ORAL | 2 refills | Status: DC

## 2022-01-29 NOTE — Progress Notes (Signed)
Subjective:    Patient ID: Julie [name], female    DOB: 1945/09/21, 76 y.o.   MRN: [id number]  HPI  76 year old female who  has a past medical history of ALLERGIC RHINITIS, Anxiety, Arthritis, ARTHRITIS, TRAUMATIC, ANKLE, Cataract, Colitis, Depression, Dyslipidemia, EXOGENOUS OBESITY, GERD (gastroesophageal reflux disease), Hemorrhoids, Hyperglycemia, and Melanoma in situ of left lower leg ([place]).  She presents to the office today for one month follow up regarding GERD and Anxiety/Depression   GERD -when she was seen a month ago she was reporting symptoms that were present for the last 2 to 3 months.  She had epigastric pain that was worse after eating and when laying down at night.  She was eating Tums "like candy" and this helped for short period of time.  On exam she was quite tender to her epigastric region.  Cannot rule out peptic ulcer disease.  She was started on Protonix 40 mg daily for 30 days  and Carafate 1 g 4 times daily for 15 days.   Anxiety/Depression -she felt as though her anxiety and depression was not well controlled.  At this time she was prescribed Xanax 1 mg twice daily and Wellbutrin 300 mg extended release.  She was not enjoying things that she once did.  We added Lexapro 20 mg to her regimen.   Today she reports that she has finished her 30 days of Protonix and her 15 days of Carafate.  She reports that she is not having any further abdominal pain, continues to have dysphagia-like symptoms and is waiting on GI appointment.  As for her anxiety and depression she does feel as though the Lexapro has made some improvements, unfortunately when she started the Lexapro she stopped her Xanax cold [place] and subsequently had hallucinations and insomnia.  The hallucinations have resolved but she continues to battle insomnia.  Review of Systems See HPI   Past Medical History:  Diagnosis Date   ALLERGIC RHINITIS    Anxiety    chronic BZs   Arthritis    ARTHRITIS,  TRAUMATIC, ANKLE    Cataract    Colitis    Depression    Dyslipidemia    EXOGENOUS OBESITY    GERD (gastroesophageal reflux disease)    Hemorrhoids    Hyperglycemia    Melanoma in situ of left lower leg ([facility])     Social History   Socioeconomic History   Marital status: Widowed    Spouse name: Not on file   Number of children: 2   Years of education: Not on file   Highest education level: Not on file  Occupational History   Occupation: retired  Tobacco Use   Smoking status: Never   Smokeless tobacco: Never  Vaping Use   Vaping Use: Never used  Substance and Sexual Activity   Alcohol use: Yes    Comment: social   Drug use: No   Sexual activity: Not on file  Other Topics Concern   Not on file  Social History Narrative   Widowed (spouse - Ron of >44years prior to death)   widowed 04-20-11 - 2 grown supportive dtrs, 5 g-kis (3 are triplets)   Social Determinants of Health   Financial Resource Strain: Low Risk  (03/08/2021)   Overall Financial Resource Strain (CARDIA)    Difficulty of Paying Living Expenses: Not hard at all  Food Insecurity: No Food Insecurity (03/08/2021)   Hunger Vital Sign    Worried About Running Out of Food in the Last  Year: Never true    [place] in the Last Year: Never true  Transportation Needs: No Transportation Needs (03/08/2021)   PRAPARE - Hydrologist (Medical): No    Lack of Transportation (Non-Medical): No  Physical Activity: Inactive (03/08/2021)   Exercise Vital Sign    Days of Exercise per Week: 0 days    Minutes of Exercise per Session: 0 min  Stress: No Stress Concern Present (03/08/2021)   [place]    Feeling of Stress : Not at all  Social Connections: Socially Isolated (03/08/2021)   Social Connection and Isolation Panel [NHANES]    Frequency of Communication with Friends and Family: Three times a week    Frequency of Social  Gatherings with Friends and Family: Three times a week    Attends Religious Services: Never    Active Member of Clubs or Organizations: No    Attends Archivist Meetings: Never    Marital Status: Widowed  Intimate Partner Violence: Not At Risk (03/08/2021)   Humiliation, Afraid, Rape, and Kick questionnaire    Fear of Current or Ex-Partner: No    Emotionally Abused: No    Physically Abused: No    Sexually Abused: No    Past Surgical History:  Procedure Laterality Date   ABDOMINAL HYSTERECTOMY  10/1978   ANKLE ARTHROSCOPY Right 10/19/12   deorio   arthroscopy left knee  05/2002, 04/2010   BREAST SURGERY  11/1988   Benign mass (R) breast   C. surgery history and scan sheet     Cataract surgery     (L) eye 09/06/12 & (R) eye 10/11/12   CHOLECYSTECTOMY  09/2004   cysto sling  10/2006   3 times   FRACTURE SURGERY     right ankle   JOINT REPLACEMENT  06/2010   R ankle   MASS EXCISION     under right arm   removal multiple cyst breast left and right     sling anterior repair  12/1999   SPINE SURGERY     laminotomy with decompression   Toe Exostosis  10/08   Bil   TOTAL ANKLE REPLACEMENT Right     Family History  Problem Relation Age of Onset   Hyperlipidemia Father    Coronary artery disease Father        cabg 62yo   Leukemia Father    Hypertension Father    Heart attack Brother 74       stents x 2 (69 & 57 yo)   Diabetes Brother    Anal fissures Brother    Diabetes Mother    Hyperlipidemia Mother    Hypertension Mother    Colon cancer Neg Hx    Esophageal cancer Neg Hx     Allergies  Allergen Reactions   Hydrocodone-Acetaminophen     REACTION: hives, itiching   Latex     Causes redness and itching   Lisinopril Cough   Oxycodone-Acetaminophen Itching    REACTION: itching    Current Outpatient Medications on File Prior to Visit  Medication Sig Dispense Refill   aspirin 81 MG tablet Take 81 mg by mouth daily.       atorvastatin (LIPITOR) 20 MG tablet  TAKE 1 TABLET(20 MG) BY MOUTH DAILY 90 tablet 3   buPROPion (WELLBUTRIN XL) 300 MG 24 hr tablet Take 1 tablet (300 mg total) by mouth daily. 90 tablet 1   Calcium Carbonate-Vitamin D  600-400 MG-UNIT tablet Take 1 tablet by mouth daily.       cyclobenzaprine (FLEXERIL) 5 MG tablet Take 5 mg by mouth at bedtime.     losartan (COZAAR) 25 MG tablet TAKE 1 TABLET(25 MG) BY MOUTH DAILY 90 tablet 3   Multiple Vitamin (MULTIVITAMIN) tablet Take 1 tablet by mouth daily.       pantoprazole (PROTONIX) 40 MG tablet Take 1 tablet (40 mg total) by mouth daily. 30 tablet 3   RA ALLERGY RELIEF 180 MG tablet take 1 tablet by mouth once daily 30 tablet 5   No current facility-administered medications on file prior to visit.    BP 128/80   Pulse (!) 106   Temp 98.4 F (36.9 C) (Oral)   Ht 5' 7.5" (1.715 m)   Wt 198 lb (89.8 kg)   SpO2 97%   BMI 30.55 kg/m       Objective:   Physical Exam Vitals and nursing note reviewed.  Constitutional:      Appearance: Normal appearance.  Cardiovascular:     Rate and Rhythm: Normal rate and regular rhythm.     Pulses: Normal pulses.     Heart sounds: Normal heart sounds.  Pulmonary:     Effort: Pulmonary effort is normal.     Breath sounds: Normal breath sounds.  Abdominal:     General: Abdomen is flat. Bowel sounds are normal.     Palpations: Abdomen is soft.     Tenderness: There is no abdominal tenderness.  Musculoskeletal:        General: Normal range of motion.  Skin:    General: Skin is warm and dry.  Neurological:     General: No focal deficit present.     Mental Status: She is alert and oriented to person, place, and time.  Psychiatric:        Mood and Affect: Mood normal.        Behavior: Behavior normal.        Thought Content: Thought content normal.        Judgment: Judgment normal.       Assessment & Plan:  1. Anxiety and depression -We will keep her on Lexapro, Wellbutrin, and Xanax.  I would like her to start weaning down on  Xanax and try taking it only at night. - ALPRAZolam (XANAX) 1 MG tablet; TAKE 1 TABLET(1 MG) BY MOUTH TWICE DAILY AS NEEDED FOR ANXIETY  Dispense: 60 tablet; Refill: 2 - escitalopram (LEXAPRO) 20 MG tablet; Take 1 tablet (20 mg total) by mouth daily.  Dispense: 90 tablet; Refill: 0  2. Gastroesophageal reflux disease without esophagitis -Resolved.  She can hold off taking any more Protonix unless she needs to restart it  3. Primary insomnia -Due to withdrawal from Xanax.  Dorothyann Peng, NP

## 2022-02-13 ENCOUNTER — Encounter: Payer: Self-pay | Admitting: Adult Health

## 2022-02-13 ENCOUNTER — Ambulatory Visit (INDEPENDENT_AMBULATORY_CARE_PROVIDER_SITE_OTHER): Payer: PPO | Admitting: Adult Health

## 2022-02-13 VITALS — BP 110/80 | HR 73 | Temp 97.8°F | Ht 67.5 in | Wt 198.0 lb

## 2022-02-13 DIAGNOSIS — M26609 Unspecified temporomandibular joint disorder, unspecified side: Secondary | ICD-10-CM

## 2022-02-13 MED ORDER — DICLOFENAC SODIUM 1 % EX GEL: 2.0000 g | Freq: Four times a day (QID) | CUTANEOUS | 0 refills | Status: DC

## 2022-02-13 NOTE — Progress Notes (Signed)
Subjective:    Patient ID: MARIALUIZA CAR, female    DOB: [birth year], 76 y.o.   MRN: [id number]  HPI 76 year old female who  has a past medical history of ALLERGIC RHINITIS, Anxiety, Arthritis, ARTHRITIS, TRAUMATIC, ANKLE, Cataract, Colitis, Depression, Dyslipidemia, EXOGENOUS OBESITY, GERD (gastroesophageal reflux disease), Hemorrhoids, Hyperglycemia, and Melanoma in situ of left lower leg ([place]).  He is being evaluated today for chronic left-sided jaw/ear pain.  She does have TMJ and has been evaluated by her dentist who prescribed her Flexeril 5 mg that she takes at night.  He reports that when she wakes up in the morning she does not have any discomfort but at about the same time a very evening she starts to develop pain in her left jaw in front of her ear.  We will use occasional Tylenol and Motrin but does not like to take these medications on a routine basis.  She reports that the pain is so bad before she goes to bed that she has a hard time laying on her left side.  Dentist would like to do Botox injections to help alleviate some of the discomfort.  She is wearing her night guard most nights.    Review of Systems See HPI   Past Medical History:  Diagnosis Date   ALLERGIC RHINITIS    Anxiety    chronic BZs   Arthritis    ARTHRITIS, TRAUMATIC, ANKLE    Cataract    Colitis    Depression    Dyslipidemia    EXOGENOUS OBESITY    GERD (gastroesophageal reflux disease)    Hemorrhoids    Hyperglycemia    Melanoma in situ of left lower leg ([facility])     Social History   Socioeconomic History   Marital status: Widowed    Spouse name: Not on file   Number of children: 2   Years of education: Not on file   Highest education level: Not on file  Occupational History   Occupation: retired  Tobacco Use   Smoking status: Never   Smokeless tobacco: Never  Vaping Use   Vaping Use: Never used  Substance and Sexual Activity   Alcohol use: Yes    Comment: social   Drug use: No    Sexual activity: Not on file  Other Topics Concern   Not on file  Social History Narrative   Widowed (spouse - Ron of >44years prior to death)   widowed 03/30/11 - 2 grown supportive dtrs, 5 g-kis (3 are triplets)   Social Determinants of Health   Financial Resource Strain: Low Risk  (03/08/2021)   Overall Financial Resource Strain (CARDIA)    Difficulty of Paying Living Expenses: Not hard at all  Food Insecurity: No Food Insecurity (03/08/2021)   Hunger Vital Sign    Worried About Running Out of Food in the Last Year: Never true    [place] in the Last Year: Never true  Transportation Needs: No Transportation Needs (03/08/2021)   PRAPARE - Hydrologist (Medical): No    Lack of Transportation (Non-Medical): No  Physical Activity: Inactive (03/08/2021)   Exercise Vital Sign    Days of Exercise per Week: 0 days    Minutes of Exercise per Session: 0 min  Stress: No Stress Concern Present (03/08/2021)   [place]    Feeling of Stress : Not at all  Social Connections: Socially Isolated (03/08/2021)  Social Connection and Isolation Panel [NHANES]    Frequency of Communication with Friends and Family: Three times a week    Frequency of Social Gatherings with Friends and Family: Three times a week    Attends Religious Services: Never    Active Member of Clubs or Organizations: No    Attends Archivist Meetings: Never    Marital Status: Widowed  Intimate Partner Violence: Not At Risk (03/08/2021)   Humiliation, Afraid, Rape, and Kick questionnaire    Fear of Current or Ex-Partner: No    Emotionally Abused: No    Physically Abused: No    Sexually Abused: No    Past Surgical History:  Procedure Laterality Date   ABDOMINAL HYSTERECTOMY  10/1978   ANKLE ARTHROSCOPY Right 10/19/12   deorio   arthroscopy left knee  05/2002, 04/2010   BREAST SURGERY  11/1988   Benign mass (R)  breast   C. surgery history and scan sheet     Cataract surgery     (L) eye 09/06/12 & (R) eye 10/11/12   CHOLECYSTECTOMY  09/2004   cysto sling  10/2006   3 times   FRACTURE SURGERY     right ankle   JOINT REPLACEMENT  06/2010   R ankle   MASS EXCISION     under right arm   removal multiple cyst breast left and right     sling anterior repair  12/1999   SPINE SURGERY     laminotomy with decompression   Toe Exostosis  10/08   Bil   TOTAL ANKLE REPLACEMENT Right     Family History  Problem Relation Age of Onset   Hyperlipidemia Father    Coronary artery disease Father        cabg 32yo   Leukemia Father    Hypertension Father    Heart attack Brother 54       stents x 2 (19 & 29 yo)   Diabetes Brother    Anal fissures Brother    Diabetes Mother    Hyperlipidemia Mother    Hypertension Mother    Colon cancer Neg Hx    Esophageal cancer Neg Hx     Allergies  Allergen Reactions   Hydrocodone-Acetaminophen     REACTION: hives, itiching   Latex     Causes redness and itching   Lisinopril Cough   Oxycodone-Acetaminophen Itching    REACTION: itching    Current Outpatient Medications on File Prior to Visit  Medication Sig Dispense Refill   ALPRAZolam (XANAX) 1 MG tablet TAKE 1 TABLET(1 MG) BY MOUTH TWICE DAILY AS NEEDED FOR ANXIETY 60 tablet 2   aspirin 81 MG tablet Take 81 mg by mouth daily.       atorvastatin (LIPITOR) 20 MG tablet TAKE 1 TABLET(20 MG) BY MOUTH DAILY 90 tablet 3   buPROPion (WELLBUTRIN XL) 300 MG 24 hr tablet Take 1 tablet (300 mg total) by mouth daily. 90 tablet 1   Calcium Carbonate-Vitamin D 600-400 MG-UNIT tablet Take 1 tablet by mouth daily.       cyclobenzaprine (FLEXERIL) 5 MG tablet Take 5 mg by mouth at bedtime.     escitalopram (LEXAPRO) 20 MG tablet Take 1 tablet (20 mg total) by mouth daily. 90 tablet 0   losartan (COZAAR) 25 MG tablet TAKE 1 TABLET(25 MG) BY MOUTH DAILY 90 tablet 3   Multiple Vitamin (MULTIVITAMIN) tablet Take 1 tablet by  mouth daily.       pantoprazole (PROTONIX) 40 MG tablet  Take 1 tablet (40 mg total) by mouth daily. 30 tablet 3   RA ALLERGY RELIEF 180 MG tablet take 1 tablet by mouth once daily 30 tablet 5   No current facility-administered medications on file prior to visit.    BP 110/80   Pulse 73   Temp 97.8 F (36.6 C) (Oral)   Ht 5' 7.5" (1.715 m)   Wt 198 lb (89.8 kg)   SpO2 97%   BMI 30.55 kg/m       Objective:   Physical Exam Vitals and nursing note reviewed.  Constitutional:      Appearance: Normal appearance.  HENT:     Head:     Jaw: Tenderness and pain on movement present.      Comments: Popping and crepitus noted with opening and closing jaw     Left Ear: Hearing, tympanic membrane, ear canal and external ear normal.  Skin:    General: Skin is warm and dry.     Capillary Refill: Capillary refill takes less than 2 seconds.  Neurological:     General: No focal deficit present.     Mental Status: She is alert and oriented to person, place, and time.        Assessment & Plan:  1. TMJ (temporomandibular joint syndrome) - advised that likely her best bet for pain relief will be botox injections. Will send in Voltaren gel that she can apply topically for pain relief.  - diclofenac Sodium (VOLTAREN ARTHRITIS PAIN) 1 % GEL; Apply 2 g topically 4 (four) times daily.  Dispense: 50 g; Refill: 0  Dorothyann Peng, NP

## 2022-02-13 NOTE — Patient Instructions (Signed)
I have sent in anti inflammatory gel called Volaren. Try rubbing this on the area up to 4 times a day   [facility]  [address] [place], [place] [address] [phone]

## 2022-02-27 ENCOUNTER — Other Ambulatory Visit: Payer: Self-pay | Admitting: Adult Health

## 2022-02-27 DIAGNOSIS — I10 Essential (primary) hypertension: Secondary | ICD-10-CM

## 2022-03-04 ENCOUNTER — Telehealth: Payer: Self-pay | Admitting: Adult Health

## 2022-03-04 NOTE — Telephone Encounter (Signed)
Spoke with patient to schedule awv.  She stated [facility] prescribed lexapro '20mg'$ . 02/13/22.  She stated she cut this back to '10mg'$ .  She stated she was stumbling  when she stood up  she was light headed and would have to sit back down.  She decided not to drive until she is feeling better.  She stated she is doing better with the 1/2 dose of lexapro.

## 2022-03-10 ENCOUNTER — Ambulatory Visit (INDEPENDENT_AMBULATORY_CARE_PROVIDER_SITE_OTHER): Payer: PPO

## 2022-03-10 VITALS — Ht 67.5 in | Wt 196.0 lb

## 2022-03-10 DIAGNOSIS — Z0000 Encounter for general adult medical examination without abnormal findings: Secondary | ICD-10-CM | POA: Diagnosis not present

## 2022-03-10 NOTE — Progress Notes (Signed)
I connected with Julie [name] today by telephone and verified that I am speaking with the correct person using two identifiers. Location patient: home Location provider: work Persons participating in the virtual visit: Julie [name], [name] LPN.   I discussed the limitations, risks, security and privacy concerns of performing an evaluation and management service by telephone and the availability of in person appointments. I also discussed with the patient that there may be a patient responsible charge related to this service. The patient expressed understanding and verbally consented to this telephonic visit.    Interactive audio and video telecommunications were attempted between this provider and patient, however failed, due to patient having technical difficulties OR patient did not have access to video capability.  We continued and completed visit with audio only.     Vital signs may be patient reported or missing.  Subjective:   Julie [name] is a 76 y.o. female who presents for Medicare Annual (Subsequent) preventive examination.  Review of Systems     Cardiac Risk Factors include: advanced age (>80mn, >>39women);dyslipidemia;hypertension;obesity (BMI >30kg/m2)     Objective:    Today's Vitals   03/10/22 1126  Weight: 196 lb (88.9 kg)  Height: 5' 7.5" (1.715 m)   Body mass index is 30.24 kg/m.     03/10/2022   11:37 AM 03/08/2021    9:51 AM 03/07/2020    2:07 PM 10/11/2016    5:22 PM 04/10/2016   10:47 AM 06/25/2015    2:10 PM  Advanced Directives  Does Patient Have a Medical Advance Directive? No No No No No No  Would patient like information on creating a medical advance directive?  No - Patient declined No - Patient declined No - Patient declined      Current Medications (verified) Outpatient Encounter Medications as of 03/10/2022  Medication Sig   ALPRAZolam (XANAX) 1 MG tablet TAKE 1 TABLET(1 MG) BY MOUTH TWICE DAILY AS NEEDED FOR ANXIETY    aspirin 81 MG tablet Take 81 mg by mouth daily.     atorvastatin (LIPITOR) 20 MG tablet TAKE 1 TABLET(20 MG) BY MOUTH DAILY   buPROPion (WELLBUTRIN XL) 300 MG 24 hr tablet Take 1 tablet (300 mg total) by mouth daily.   Calcium Carbonate-Vitamin D 600-400 MG-UNIT tablet Take 1 tablet by mouth daily.     diclofenac Sodium (VOLTAREN ARTHRITIS PAIN) 1 % GEL Apply 2 g topically 4 (four) times daily.   escitalopram (LEXAPRO) 20 MG tablet Take 1 tablet (20 mg total) by mouth daily. (Patient taking differently: Take 20 mg by mouth daily. Taking 10 mg daily (1/2 tab))   losartan (COZAAR) 25 MG tablet TAKE 1 TABLET(25 MG) BY MOUTH DAILY   Multiple Vitamin (MULTIVITAMIN) tablet Take 1 tablet by mouth daily.     pantoprazole (PROTONIX) 40 MG tablet Take 1 tablet (40 mg total) by mouth daily.   RA ALLERGY RELIEF 180 MG tablet take 1 tablet by mouth once daily   cyclobenzaprine (FLEXERIL) 5 MG tablet Take 5 mg by mouth at bedtime. (Patient not taking: Reported on 03/10/2022)   No facility-administered encounter medications on file as of 03/10/2022.    Allergies (verified) Hydrocodone-acetaminophen, Latex, Lisinopril, and Oxycodone-acetaminophen   History: Past Medical History:  Diagnosis Date   ALLERGIC RHINITIS    Anxiety    chronic BZs   Arthritis    ARTHRITIS, TRAUMATIC, ANKLE    Cataract    Colitis    Depression    Dyslipidemia    EXOGENOUS OBESITY  GERD (gastroesophageal reflux disease)    Hemorrhoids    Hyperglycemia    Melanoma in situ of left lower leg [facility])    Past Surgical History:  Procedure Laterality Date   ABDOMINAL HYSTERECTOMY  10/1978   ANKLE ARTHROSCOPY Right 10/19/12   deorio   arthroscopy left knee  05/2002, 04/2010   BREAST SURGERY  11/1988   Benign mass (R) breast   C. surgery history and scan sheet     Cataract surgery     (L) eye 09/06/12 & (R) eye 10/11/12   CHOLECYSTECTOMY  09/2004   cysto sling  10/2006   3 times   FRACTURE SURGERY     right ankle   JOINT  REPLACEMENT  06/2010   R ankle   MASS EXCISION     under right arm   removal multiple cyst breast left and right     sling anterior repair  12/1999   SPINE SURGERY     laminotomy with decompression   Toe Exostosis  10/08   Bil   TOTAL ANKLE REPLACEMENT Right    Family History  Problem Relation Age of Onset   Hyperlipidemia Father    Coronary artery disease Father        cabg 33yo   Leukemia Father    Hypertension Father    Heart attack Brother 65       stents x 2 (57 & 36 yo)   Diabetes Brother    Anal fissures Brother    Diabetes Mother    Hyperlipidemia Mother    Hypertension Mother    Colon cancer Neg Hx    Esophageal cancer Neg Hx    Social History   Socioeconomic History   Marital status: Widowed    Spouse name: Not on file   Number of children: 2   Years of education: Not on file   Highest education level: Not on file  Occupational History   Occupation: retired  Tobacco Use   Smoking status: Never   Smokeless tobacco: Never  Vaping Use   Vaping Use: Never used  Substance and Sexual Activity   Alcohol use: Yes    Comment: social   Drug use: No   Sexual activity: Not on file  Other Topics Concern   Not on file  Social History Narrative   Widowed (spouse - Julie [name] of >44years prior to death)   widowed 24-Apr-2011 - 2 grown supportive dtrs, 5 g-kis (3 are triplets)   Social Determinants of Health   Financial Resource Strain: Low Risk  (03/10/2022)   Overall Financial Resource Strain (CARDIA)    Difficulty of Paying Living Expenses: Not hard at all  Food Insecurity: No Food Insecurity (03/10/2022)   Hunger Vital Sign    Worried About Running Out of Food in the Last Year: Never true    [place] in the Last Year: Never true  Transportation Needs: No Transportation Needs (03/10/2022)   PRAPARE - Hydrologist (Medical): No    Lack of Transportation (Non-Medical): No  Physical Activity: Inactive (03/10/2022)   Exercise Vital Sign     Days of Exercise per Week: 0 days    Minutes of Exercise per Session: 0 min  Stress: No Stress Concern Present (03/10/2022)   [place]    Feeling of Stress : Only a little  Social Connections: Socially Isolated (03/08/2021)   Social Connection and Isolation Panel [NHANES]    Frequency  of Communication with Friends and Family: Three times a week    Frequency of Social Gatherings with Friends and Family: Three times a week    Attends Religious Services: Never    Active Member of Clubs or Organizations: No    Attends Archivist Meetings: Never    Marital Status: Widowed    Tobacco Counseling Counseling given: Not Answered   Clinical Intake:  Pre-visit preparation completed: Yes  Pain : No/denies pain     Nutritional Status: BMI > 30  Obese Nutritional Risks: None Diabetes: No  How often do you need to have someone help you when you read instructions, pamphlets, or other written materials from your doctor or pharmacy?: 1 - Never What is the last grade level you completed in school?: 12th grade  Diabetic? no  Interpreter Needed?: No  Information entered by :: NAllen LPN   Activities of Daily Living    03/10/2022   11:43 AM  In your present state of health, do you have any difficulty performing the following activities:  Hearing? 0  Vision? 0  Difficulty concentrating or making decisions? 0  Walking or climbing stairs? 0  Dressing or bathing? 0  Doing errands, shopping? 0  Preparing Food and eating ? N  Using the Toilet? N  In the past six months, have you accidently leaked urine? N  Do you have problems with loss of bowel control? N  Managing your Medications? N  Managing your Finances? N  Housekeeping or managing your Housekeeping? N    Patient Care Team: Dorothyann Peng, NP as PCP - General (Family Medicine) Louretta Shorten, MD (Obstetrics and Gynecology) Earlie Server, MD  (Orthopedic Surgery) Deorio, Sharion Dove, MD (Orthopedic Surgery)  Indicate any recent Medical Services you may have received from other than [facility] providers in the past year (date may be approximate).     Assessment:   This is a routine wellness examination for Julie [name].  Hearing/Vision screen Vision Screening - Comments:: Regular eye exams, [facility]  Dietary issues and exercise activities discussed: Current Exercise Habits: The patient does not participate in regular exercise at present   Goals Addressed             This Visit's Progress    Patient Stated       03/10/2022, wants to get center of gravity back       Depression Screen    03/10/2022   11:40 AM 01/29/2022    1:09 PM 03/08/2021    9:53 AM 03/08/2021    9:49 AM 01/24/2021    3:38 PM 03/07/2020    2:10 PM 06/29/2017    9:08 AM  PHQ 2/9 Scores  PHQ - 2 Score 2 2 0 0 '5 2 6  '$ PHQ- 9 Score '3 10   10 3     '$ Fall Risk    03/10/2022   11:38 AM 07/17/2021    3:12 PM 03/08/2021    9:52 AM 03/07/2020    2:08 PM 06/29/2017    8:40 AM  Fall Risk   Falls in the past year? 1 1 0 0 No  Comment loses balance      Number falls in past yr: 1 1 0 0   Injury with Fall? 0 1 0 0   Risk for fall due to : Impaired balance/gait;Medication side effect No Fall Risks No Fall Risks Impaired balance/gait;Medication side effect   Follow up Falls evaluation completed;Education provided;Falls prevention discussed Falls evaluation completed;Follow up appointment Falls evaluation  completed Falls evaluation completed;Falls prevention discussed     FALL RISK PREVENTION PERTAINING TO THE HOME:  Any stairs in or around the home? Yes  If so, are there any without handrails? No  Home free of loose throw rugs in walkways, pet beds, electrical cords, etc? Yes  Adequate lighting in your home to reduce risk of falls? Yes   ASSISTIVE DEVICES UTILIZED TO PREVENT FALLS:  Life alert? No  Use of a cane, walker or w/c? No  Grab bars in the bathroom?  Yes  Shower chair or bench in shower? Yes  Elevated toilet seat or a handicapped toilet? No   TIMED UP AND GO:  Was the test performed? No .  .     Cognitive Function:        03/10/2022   11:45 AM  6CIT Screen  What Year? 0 points  What month? 0 points  What time? 0 points  Count back from 20 0 points  Months in reverse 0 points  Repeat phrase 2 points  Total Score 2 points    Immunizations Immunization History  Administered Date(s) Administered   Fluad Quad(high Dose 65+) 04/12/2019, 04/26/2020   Influenza Split 05/21/2012   Influenza Whole 04/12/2009, 04/16/2010, 04/05/2011   Influenza, High Dose Seasonal PF 04/18/2013, 03/27/2016, 04/22/2018   Influenza,inj,Quad PF,6+ Mos 03/09/2014   Influenza-Unspecified 04/26/2015   PFIZER(Purple Top)SARS-COV-2 Vaccination 08/28/2019, 09/20/2019   Pneumococcal Conjugate-13 03/14/2015   Pneumococcal Polysaccharide-23 06/30/2011   Td 10/16/2005, 04/12/2009   Zoster, Live 02/03/2012    TDAP status: Due, Education has been provided regarding the importance of this vaccine. Advised may receive this vaccine at local pharmacy or Health Dept. Aware to provide a copy of the vaccination record if obtained from local pharmacy or Health Dept. Verbalized acceptance and understanding.  Flu Vaccine status: Due, Education has been provided regarding the importance of this vaccine. Advised may receive this vaccine at local pharmacy or Health Dept. Aware to provide a copy of the vaccination record if obtained from local pharmacy or Health Dept. Verbalized acceptance and understanding.  Pneumococcal vaccine status: Up to date  Covid-19 vaccine status: Completed vaccines  Qualifies for Shingles Vaccine? Yes   Zostavax completed Yes   Shingrix Completed?: No.    Education has been provided regarding the importance of this vaccine. Patient has been advised to call insurance company to determine out of pocket expense if they have not yet received  this vaccine. Advised may also receive vaccine at local pharmacy or Health Dept. Verbalized acceptance and understanding.  Screening Tests Health Maintenance  Topic Date Due   Zoster Vaccines- Shingrix (1 of 2) Never done   TETANUS/TDAP  04/13/2019   COVID-19 Vaccine (3 - Pfizer series) 11/15/2019   INFLUENZA VACCINE  02/11/2022   MAMMOGRAM  06/05/2022   COLONOSCOPY (Pts 45-3yr Insurance coverage will need to be confirmed)  06/24/2025   Pneumonia Vaccine 76 Years old  Completed   DEXA SCAN  Completed   Hepatitis C Screening  Completed   HPV VACCINES  Aged Out    Health Maintenance  Health Maintenance Due  Topic Date Due   Zoster Vaccines- Shingrix (1 of 2) Never done   TETANUS/TDAP  04/13/2019   COVID-19 Vaccine (3 - Pfizer series) 11/15/2019   INFLUENZA VACCINE  02/11/2022    Colorectal cancer screening: Type of screening: Colonoscopy. Completed 06/25/2015. Repeat every 10 years  Mammogram status: Completed 06/05/2021. Repeat every year  Bone Density status: Completed 07/04/2021.   Lung Cancer Screening: (Low  Dose CT Chest recommended if Age 25-80 years, 30 pack-year currently smoking OR have quit w/in 15years.) does not qualify.   Lung Cancer Screening Referral: no  Additional Screening:  Hepatitis C Screening: does qualify; Completed 03/21/2016  Vision Screening: Recommended annual ophthalmology exams for early detection of glaucoma and other disorders of the eye. Is the patient up to date with their annual eye exam?  Yes  Who is the provider or what is the name of the office in which the patient attends annual eye exams? [facility] If pt is not established with a provider, would they like to be referred to a provider to establish care? No .   Dental Screening: Recommended annual dental exams for proper oral hygiene  Community Resource Referral / Chronic Care Management: CRR required this visit?  No   CCM required this visit?  No      Plan:     I have  personally reviewed and noted the following in the patient's chart:   Medical and social history Use of alcohol, tobacco or illicit drugs  Current medications and supplements including opioid prescriptions. Patient is not currently taking opioid prescriptions. Functional ability and status Nutritional status Physical activity Advanced directives List of other physicians Hospitalizations, surgeries, and ER visits in previous 12 months Vitals Screenings to include cognitive, depression, and falls Referrals and appointments  In addition, I have reviewed and discussed with patient certain preventive protocols, quality metrics, and best practice recommendations. A written personalized care plan for preventive services as well as general preventive health recommendations were provided to patient.     Kellie Simmering, LPN   6/37/8588   Nurse Notes: none  Due to this being a virtual visit, the after visit summary with patients personalized plan was offered to patient via mail or my-chart.  Patient would like to access on my-chart

## 2022-03-10 NOTE — Patient Instructions (Signed)
Ms. Janosik , Thank you for taking time to come for your Medicare Wellness Visit. I appreciate your ongoing commitment to your health goals. Please review the following plan we discussed and let me know if I can assist you in the future.   Screening recommendations/referrals: Colonoscopy: Completed 06/25/2015, due 06/24/2025 Mammogram: completed 06/05/2021, due 06/06/2022 Bone Density: completed 07/04/2021 Recommended yearly ophthalmology/optometry visit for glaucoma screening and checkup Recommended yearly dental visit for hygiene and checkup  Vaccinations: Influenza vaccine: due Pneumococcal vaccine: completed 03/14/2015 Tdap vaccine: due Shingles vaccine: discussed   Covid-19: 09/20/2019, 08/28/2019  Advanced directives: Advance directive discussed with you today.   Conditions/risks identified: none  Next appointment: Follow up in one year for your annual wellness visit    Preventive Care 65 Years and Older, Female Preventive care refers to lifestyle choices and visits with your health care provider that can promote health and wellness. What does preventive care include? A yearly physical exam. This is also called an annual well check. Dental exams once or twice a year. Routine eye exams. Ask your health care provider how often you should have your eyes checked. Personal lifestyle choices, including: Daily care of your teeth and gums. Regular physical activity. Eating a healthy diet. Avoiding tobacco and drug use. Limiting alcohol use. Practicing safe sex. Taking low-dose aspirin every day. Taking vitamin and mineral supplements as recommended by your health care provider. What happens during an annual well check? The services and screenings done by your health care provider during your annual well check will depend on your age, overall health, lifestyle risk factors, and family history of disease. Counseling  Your health care provider may ask you questions about  your: Alcohol use. Tobacco use. Drug use. Emotional well-being. Home and relationship well-being. Sexual activity. Eating habits. History of falls. Memory and ability to understand (cognition). Work and work Statistician. Reproductive health. Screening  You may have the following tests or measurements: Height, weight, and BMI. Blood pressure. Lipid and cholesterol levels. These may be checked every 5 years, or more frequently if you are over 52 years old. Skin check. Lung cancer screening. You may have this screening every year starting at age 32 if you have a 30-pack-year history of smoking and currently smoke or have quit within the past 15 years. Fecal occult blood test (FOBT) of the stool. You may have this test every year starting at age 8. Flexible sigmoidoscopy or colonoscopy. You may have a sigmoidoscopy every 5 years or a colonoscopy every 10 years starting at age 10. Hepatitis C blood test. Hepatitis B blood test. Sexually transmitted disease (STD) testing. Diabetes screening. This is done by checking your blood sugar (glucose) after you have not eaten for a while (fasting). You may have this done every 1-3 years. Bone density scan. This is done to screen for osteoporosis. You may have this done starting at age 66. Mammogram. This may be done every 1-2 years. Talk to your health care provider about how often you should have regular mammograms. Talk with your health care provider about your test results, treatment options, and if necessary, the need for more tests. Vaccines  Your health care provider may recommend certain vaccines, such as: Influenza vaccine. This is recommended every year. Tetanus, diphtheria, and acellular pertussis (Tdap, Td) vaccine. You may need a Td booster every 10 years. Zoster vaccine. You may need this after age 27. Pneumococcal 13-valent conjugate (PCV13) vaccine. One dose is recommended after age 67. Pneumococcal polysaccharide (PPSV23) vaccine.  One dose  is recommended after age 16. Talk to your health care provider about which screenings and vaccines you need and how often you need them. This information is not intended to replace advice given to you by your health care provider. Make sure you discuss any questions you have with your health care provider. Document Released: 07/27/2015 Document Revised: 03/19/2016 Document Reviewed: 05/01/2015 Elsevier Interactive Patient Education  2017 [place] Prevention in the Home Falls can cause injuries. They can happen to people of all ages. There are many things you can do to make your home safe and to help prevent falls. What can I do on the outside of my home? Regularly fix the edges of walkways and driveways and fix any cracks. Remove anything that might make you trip as you walk through a door, such as a raised step or threshold. Trim any bushes or trees on the path to your home. Use bright outdoor lighting. Clear any walking paths of anything that might make someone trip, such as rocks or tools. Regularly check to see if handrails are loose or broken. Make sure that both sides of any steps have handrails. Any raised decks and porches should have guardrails on the edges. Have any leaves, snow, or ice cleared regularly. Use sand or salt on walking paths during winter. Clean up any spills in your garage right away. This includes oil or grease spills. What can I do in the bathroom? Use night lights. Install grab bars by the toilet and in the tub and shower. Do not use towel bars as grab bars. Use non-skid mats or decals in the tub or shower. If you need to sit down in the shower, use a plastic, non-slip stool. Keep the floor dry. Clean up any water that spills on the floor as soon as it happens. Remove soap buildup in the tub or shower regularly. Attach bath mats securely with double-sided non-slip rug tape. Do not have throw rugs and other things on the floor that can make  you trip. What can I do in the bedroom? Use night lights. Make sure that you have a light by your bed that is easy to reach. Do not use any sheets or blankets that are too big for your bed. They should not hang down onto the floor. Have a firm chair that has side arms. You can use this for support while you get dressed. Do not have throw rugs and other things on the floor that can make you trip. What can I do in the kitchen? Clean up any spills right away. Avoid walking on wet floors. Keep items that you use a lot in easy-to-reach places. If you need to reach something above you, use a strong step stool that has a grab bar. Keep electrical cords out of the way. Do not use floor polish or wax that makes floors slippery. If you must use wax, use non-skid floor wax. Do not have throw rugs and other things on the floor that can make you trip. What can I do with my stairs? Do not leave any items on the stairs. Make sure that there are handrails on both sides of the stairs and use them. Fix handrails that are broken or loose. Make sure that handrails are as long as the stairways. Check any carpeting to make sure that it is firmly attached to the stairs. Fix any carpet that is loose or worn. Avoid having throw rugs at the top or bottom of the stairs. If  you do have throw rugs, attach them to the floor with carpet tape. Make sure that you have a light switch at the top of the stairs and the bottom of the stairs. If you do not have them, ask someone to add them for you. What else can I do to help prevent falls? Wear shoes that: Do not have high heels. Have rubber bottoms. Are comfortable and fit you well. Are closed at the toe. Do not wear sandals. If you use a stepladder: Make sure that it is fully opened. Do not climb a closed stepladder. Make sure that both sides of the stepladder are locked into place. Ask someone to hold it for you, if possible. Clearly mark and make sure that you can  see: Any grab bars or handrails. First and last steps. Where the edge of each step is. Use tools that help you move around (mobility aids) if they are needed. These include: Canes. Walkers. Scooters. Crutches. Turn on the lights when you go into a dark area. Replace any light bulbs as soon as they burn out. Set up your furniture so you have a clear path. Avoid moving your furniture around. If any of your floors are uneven, fix them. If there are any pets around you, be aware of where they are. Review your medicines with your doctor. Some medicines can make you feel dizzy. This can increase your chance of falling. Ask your doctor what other things that you can do to help prevent falls. This information is not intended to replace advice given to you by your health care provider. Make sure you discuss any questions you have with your health care provider. Document Released: 04/26/2009 Document Revised: 12/06/2015 Document Reviewed: 08/04/2014 Elsevier Interactive Patient Education  2017 [employer].

## 2022-03-25 ENCOUNTER — Ambulatory Visit (INDEPENDENT_AMBULATORY_CARE_PROVIDER_SITE_OTHER): Payer: PPO | Admitting: Adult Health

## 2022-03-25 ENCOUNTER — Encounter: Payer: Self-pay | Admitting: Adult Health

## 2022-03-25 VITALS — BP 130/80 | HR 93 | Temp 98.1°F | Ht 67.5 in | Wt 197.0 lb

## 2022-03-25 DIAGNOSIS — T50905A Adverse effect of unspecified drugs, medicaments and biological substances, initial encounter: Secondary | ICD-10-CM | POA: Diagnosis not present

## 2022-03-25 DIAGNOSIS — F419 Anxiety disorder, unspecified: Secondary | ICD-10-CM | POA: Diagnosis not present

## 2022-03-25 DIAGNOSIS — F32A Depression, unspecified: Secondary | ICD-10-CM | POA: Diagnosis not present
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
## 2022-04-04 ENCOUNTER — Other Ambulatory Visit: Payer: Self-pay | Admitting: Adult Health

## 2022-04-04 DIAGNOSIS — E785 Hyperlipidemia, unspecified: Secondary | ICD-10-CM

## 2022-04-04 NOTE — Telephone Encounter (Signed)
Patient need to schedule a CPE for more refills. 

## 2022-04-08 ENCOUNTER — Other Ambulatory Visit: Payer: Self-pay | Admitting: Adult Health

## 2022-04-08 DIAGNOSIS — E785 Hyperlipidemia, unspecified: Secondary | ICD-10-CM

## 2022-05-05 ENCOUNTER — Other Ambulatory Visit: Payer: Self-pay | Admitting: Adult Health

## 2022-05-05 DIAGNOSIS — F419 Anxiety disorder, unspecified: Secondary | ICD-10-CM

## 2022-05-07 ENCOUNTER — Telehealth: Payer: Self-pay | Admitting: Adult Health

## 2022-05-07 NOTE — Telephone Encounter (Signed)
Pt call and stated she want to know why her medication was denied and want a call back

## 2022-06-02 ENCOUNTER — Ambulatory Visit: Payer: PPO | Admitting: Nurse Practitioner

## 2022-06-02 ENCOUNTER — Encounter: Payer: Self-pay | Admitting: Nurse Practitioner

## 2022-06-02 VITALS — BP 118/68 | HR 78 | Ht 67.0 in | Wt 202.0 lb

## 2022-06-02 DIAGNOSIS — K219 Gastro-esophageal reflux disease without esophagitis: Secondary | ICD-10-CM

## 2022-06-02 DIAGNOSIS — R070 Pain in throat: Secondary | ICD-10-CM | POA: Diagnosis not present

## 2022-06-02 DIAGNOSIS — R1310 Dysphagia, unspecified: Secondary | ICD-10-CM | POA: Diagnosis not present

## 2022-06-02 MED ORDER — FAMOTIDINE 20 MG PO TABS: 20.0000 mg | ORAL_TABLET | Freq: Every day | ORAL | 0 refills | Status: DC

## 2022-06-02 NOTE — Patient Instructions (Addendum)
You have been scheduled for an endoscopy. Please follow written instructions given to you at your visit today. If you use inhalers (even only as needed), please bring them with you on the day of your procedure.   We have sent the following medications to your pharmacy for you to pick up at your convenience: Famotidine 20 mg  We have sent over your referral to [facility].  Avoid Advil/Ibuprofen.  Thank you for trusting me with your gastrointestinal care!   Carl Best, CRNP

## 2022-06-02 NOTE — Progress Notes (Signed)
06/02/2022 Julie [name] [id number] 1946-02-03   CHIEF COMPLAINT: Throat clearing, choking episodes  HISTORY OF PRESENT ILLNESS: Julie [name] is a 76 year old female with a history of anxiety, depression, arthritis, malignant melanoma to the left lower leg 15+ years, hypertension, hyperlipidemia and colitis (infectious versus segmental ischemic colitis).  She is known by Dr. Silverio Decamp.  She presents to our office today as referred by Dorothyann Peng NP for further evaluation regarding pain to the right side of her throat, feels like something is in the right side of her throat.  She denies having odynophagia.  She sometimes has difficulty swallowing saliva or food which results in choking like episodes.  She feels as if food gets stuck in her throat which results in voice hoarseness but she can breathe without difficulty.  Taking a sip of water does not alleviate her symptoms.  These episodes occur once or twice daily for the past year.  She has nighttime acid reflux which occurs most nights for the past 1-1/2 years relieved by taking Tums.  She takes aspirin 81 mg once daily.  She takes Advil 800 mg once or twice daily on and off for the past year due to having right ankle pain.  She underwent an EGD in the 1990s due to having evidence of blood in her stool which she reported was normal.  She underwent a colonoscopy at that time as well which she reported was normal.  She underwent a colonoscopy 01/2005 which showed diverticulosis without colon polyps.  Her most recent colonoscopy 06/25/2015 completed by Dr. Silverio Decamp identified 1 hyperplastic polyp removed from the sigmoid colon.  She was advised to repeat a colonoscopy in 10 years.  No known family history of colorectal cancer.     Latest Ref Rng & Units 01/24/2021    4:08 PM 10/21/2019   11:06 AM 07/28/2018    2:39 PM  CBC  WBC 4.0 - 10.5 K/uL 8.8  8.4  9.0   Hemoglobin 12.0 - 15.0 g/dL 13.5  13.1  14.2   Hematocrit 36.0 - 46.0 % 39.9   39.0  42.2   Platelets 150.0 - 400.0 K/uL 260.0  235.0  256.0        Latest Ref Rng & Units 01/24/2021    4:08 PM 10/21/2019   11:06 AM 07/28/2018    2:39 PM  CMP  Glucose 70 - 99 mg/dL 98  106  86   BUN 6 - 23 mg/dL '13  11  15   '$ Creatinine 0.40 - 1.20 mg/dL 0.98  0.98  0.92   Sodium 135 - 145 mEq/L 141  140  141   Potassium 3.5 - 5.1 mEq/L 4.2  4.6  4.3   Chloride 96 - 112 mEq/L 105  104  104   CO2 19 - 32 mEq/L '28  28  31   '$ Calcium 8.4 - 10.5 mg/dL 9.4  9.4  9.8   Total Protein 6.0 - 8.3 g/dL 7.1  6.7  7.2   Total Bilirubin 0.2 - 1.2 mg/dL 0.6  0.6  0.4   Alkaline Phos 39 - 117 U/L 69  69  66   AST 0 - 37 U/L '21  15  15   '$ ALT 0 - 35 U/L '21  14  14     '$ PAST GI PROCEDURES: Colonoscopy 06/25/2015: Hyperplastic sessile polyp ranging between 3-36m in size was found in the sigmoid colon; polypectomy was performed with a cold snare -10-year recall  Colonoscopy  01/21/2005: Diverticulosis to the splenic flexure to colon Tortuous colon Difficult to sedate with Fentanyl and Versed.  Past Medical History:  Diagnosis Date   ALLERGIC RHINITIS    Anxiety    chronic BZs   Arthritis    ARTHRITIS, TRAUMATIC, ANKLE    Cataract    Colitis    Depression    Dyslipidemia    EXOGENOUS OBESITY    GERD (gastroesophageal reflux disease)    Hemorrhoids    Hyperglycemia    Melanoma in situ of left lower leg [facility])    Past Surgical History:  Procedure Laterality Date   ABDOMINAL HYSTERECTOMY  10/1978   ANKLE ARTHROSCOPY Right 10/19/12   deorio   arthroscopy left knee  05/2002, 04/2010   BREAST SURGERY  11/1988   Benign mass (R) breast   C. surgery history and scan sheet     Cataract surgery     (L) eye 09/06/12 & (R) eye 10/11/12   CHOLECYSTECTOMY  09/2004   cysto sling  10/2006   3 times   FRACTURE SURGERY     right ankle   JOINT REPLACEMENT  06/2010   R ankle   MASS EXCISION     under right arm   removal multiple cyst breast left and right     sling anterior repair  12/1999   SPINE  SURGERY     laminotomy with decompression   Toe Exostosis  10/08   Bil   TOTAL ANKLE REPLACEMENT Right    Social History: Nonsmoker. She drinks a bloody [place] [facility] once every few months. No drug use.   Family History: Father had heart disease, he died from Leukemia. Mother had hypertension and dementia. Maternal aunt had breast cancer. Brother with heart disease.  No known family history of colorectal cancer.  Allergies  Allergen Reactions   Celexa [Citalopram] Other (See Comments)    Confusion    Hydrocodone-Acetaminophen     REACTION: hives, itiching   Latex     Causes redness and itching   Lisinopril Cough   Oxycodone-Acetaminophen Itching    REACTION: itching      Outpatient Encounter Medications as of 06/02/2022  Medication Sig   ALPRAZolam (XANAX) 1 MG tablet TAKE 1 TABLET(1 MG) BY MOUTH TWICE DAILY AS NEEDED FOR ANXIETY   aspirin 81 MG tablet Take 81 mg by mouth daily.     atorvastatin (LIPITOR) 20 MG tablet TAKE 1 TABLET(20 MG) BY MOUTH DAILY   buPROPion (WELLBUTRIN XL) 300 MG 24 hr tablet Take 1 tablet (300 mg total) by mouth daily.   Calcium Carbonate-Vitamin D 600-400 MG-UNIT tablet Take 1 tablet by mouth daily.     diclofenac Sodium (VOLTAREN ARTHRITIS PAIN) 1 % GEL Apply 2 g topically 4 (four) times daily.   losartan (COZAAR) 25 MG tablet TAKE 1 TABLET(25 MG) BY MOUTH DAILY   Multiple Vitamin (MULTIVITAMIN) tablet Take 1 tablet by mouth daily.     RA ALLERGY RELIEF 180 MG tablet take 1 tablet by mouth once daily   No facility-administered encounter medications on file as of 06/02/2022.   REVIEW OF SYSTEMS:  Gen: Denies fever, sweats or chills. No weight loss.  CV: Denies chest pain, palpitations or edema. Resp: Denies cough, shortness of breath of hemoptysis.  GI: See HPI.   GU : Denies urinary burning, blood in urine, increased urinary frequency or incontinence. MS: + Right ankle pain.  Derm: Denies rash, itchiness, skin lesions or unhealing ulcers. Psych: +  Anxiety, depression.  Heme: Denies bruising, easy  bleeding. Neuro:  Denies headaches, dizziness or paresthesias. Endo:  Denies any problems with DM, thyroid or adrenal function.  PHYSICAL EXAM:  BP 118/68   Pulse 78   Ht '5\' 7"'$  (1.702 m)   Wt 202 lb (91.6 kg)   SpO2 98%   BMI 31.64 kg/m   General: 76 year old female in no acute distress. Head: Normocephalic and atraumatic. Eyes:  Sclerae non-icteric, conjunctive pink. Ears: Normal auditory acuity. Mouth: Dentition intact. No ulcers or lesions.  Neck: Supple, no lymphadenopathy or thyromegaly.  Lungs: Clear bilaterally to auscultation without wheezes, crackles or rhonchi. Heart: Regular rate and rhythm. No murmur, rub or gallop appreciated.  Abdomen: Soft, nondistended.  Mild epigastric and lower abdominal tenderness without rebound or guarding.  No masses. No hepatosplenomegaly. Normoactive bowel sounds x 4 quadrants.  Rectal: Deferred. Musculoskeletal: Symmetrical with no gross deformities. Skin: Warm and dry. No rash or lesions on visible extremities. Extremities: No edema. Neurological: Alert oriented x 4, no focal deficits.  Psychological:  Alert and cooperative. Normal mood and affect.  ASSESSMENT AND PLAN:  44) 76 year old female with nighttime GERD symptoms x 1 1/2 years with frequent throat clearing, feels something inside right throat area with choking like episodes which occur daily for the pas year.  Chronic NSAID use. Questionable laryngeal spasms versus LPR component.  -EGD to rule out esophagitis/upper GI malignancy, benefits and risks discussed including risk with sedation, risk of bleeding, perforation and infection  -ENT referral to include laryngoscopy to further evaluate the pharynx/vocal cords -Famotidine '20mg'$  po bid  -Patient to contact office if symptoms worsen prior to EGD date -Consider barium swallow study if EGD and ENT evaluations unrevealing -Reduce Advil/ibuprofen use  2) Colon cancer screening. Her  most recent colonoscopy 06/25/2015 completed identified 1 hyperplastic polyp removed from the sigmoid colon. No known family history of colorectal cancer. -Next colonoscopy due 06/2025 medically appropriate as the patient will be 25 at that time       CC:  Dorothyann Peng, NP

## 2022-06-11 LAB — HM MAMMOGRAPHY

## 2022-06-13 ENCOUNTER — Encounter: Payer: Self-pay | Admitting: Adult Health

## 2022-06-23 ENCOUNTER — Ambulatory Visit (AMBULATORY_SURGERY_CENTER): Payer: PPO | Admitting: Gastroenterology

## 2022-06-23 ENCOUNTER — Encounter: Payer: Self-pay | Admitting: Gastroenterology

## 2022-06-23 ENCOUNTER — Other Ambulatory Visit: Payer: Self-pay

## 2022-06-23 VITALS — BP 119/54 | HR 70 | Temp 96.7°F | Resp 14 | Ht 67.0 in | Wt 202.0 lb

## 2022-06-23 DIAGNOSIS — K2990 Gastroduodenitis, unspecified, without bleeding: Secondary | ICD-10-CM | POA: Diagnosis not present

## 2022-06-23 DIAGNOSIS — K2970 Gastritis, unspecified, without bleeding: Secondary | ICD-10-CM

## 2022-06-23 DIAGNOSIS — R070 Pain in throat: Secondary | ICD-10-CM

## 2022-06-23 DIAGNOSIS — K2950 Unspecified chronic gastritis without bleeding: Secondary | ICD-10-CM | POA: Diagnosis not present

## 2022-06-23 DIAGNOSIS — B9681 Helicobacter pylori [H. pylori] as the cause of diseases classified elsewhere: Secondary | ICD-10-CM | POA: Diagnosis not present

## 2022-06-23 DIAGNOSIS — R09A2 Foreign body sensation, throat: Secondary | ICD-10-CM

## 2022-06-23 DIAGNOSIS — K219 Gastro-esophageal reflux disease without esophagitis: Secondary | ICD-10-CM

## 2022-06-23 MED ORDER — ESOMEPRAZOLE MAGNESIUM 20 MG PO CPDR: 20.0000 mg | DELAYED_RELEASE_CAPSULE | Freq: Every day | ORAL | 1 refills | Status: DC

## 2022-06-23 MED ORDER — SODIUM CHLORIDE 0.9 % IV SOLN: 500.0000 mL | Freq: Once | INTRAVENOUS | Status: DC

## 2022-06-23 NOTE — Progress Notes (Signed)
Please refer to office visit note 06/02/22 by Carl Best. No additional changes in H&P Patient is appropriate for planned procedure(s) and anesthesia in an ambulatory setting  K. Denzil Magnuson , MD [phone]

## 2022-06-23 NOTE — Progress Notes (Signed)
Report to PACU, RN, vss, BBS= Clear.  

## 2022-06-23 NOTE — Op Note (Addendum)
[place] Patient Name: Julie [name] Procedure Date: 06/23/2022 3:35 PM MRN: [id number] Endoscopist: Mauri Pole , MD, [id number] Age: 76 Referring MD:  Date of Birth: 15-Feb-1946 Gender: Female Account #: [id number] Procedure:                Upper GI endoscopy Indications:              Suspected esophageal reflux, Globus sensation, Sore                            throat Medicines:                Monitored Anesthesia Care Procedure:                Pre-Anesthesia Assessment:                           - Prior to the procedure, a History and Physical                            was performed, and patient medications and                            allergies were reviewed. The patient's tolerance of                            previous anesthesia was also reviewed. The risks                            and benefits of the procedure and the sedation                            options and risks were discussed with the patient.                            All questions were answered, and informed consent                            was obtained. Prior Anticoagulants: The patient has                            taken no anticoagulant or antiplatelet agents. ASA                            Grade Assessment: II - A patient with mild systemic                            disease. After reviewing the risks and benefits,                            the patient was deemed in satisfactory condition to                            undergo the procedure.  After obtaining informed consent, the endoscope was                            passed under direct vision. Throughout the                            procedure, the patient's blood pressure, pulse, and                            oxygen saturations were monitored continuously. The                            GIF [id number] #[id number] was introduced through the                            mouth, and advanced to the second part  of duodenum.                            The upper GI endoscopy was accomplished without                            difficulty. The patient tolerated the procedure                            well. Scope In: Scope Out: Findings:                 The Z-line was regular and was found 40 cm from the                            incisors.                           No gross lesions were noted in the entire esophagus.                           The gastroesophageal flap valve was visualized                            endoscopically and classified as Hill Grade II                            (fold present, opens with respiration).                           Patchy mild inflammation characterized by                            congestion (edema) and erythema was found in the                            entire examined stomach. Biopsies were taken with a                            cold forceps for Helicobacter pylori testing.  The cardia and gastric fundus were normal on                            retroflexion.                           Patchy mildly erythematous mucosa was found in the                            duodenal bulb. Biopsies were taken with a cold                            forceps for histology. Complications:            No immediate complications. Estimated Blood Loss:     Estimated blood loss was minimal. Impression:               - Z-line regular, 40 cm from the incisors.                           - No gross lesions in the entire esophagus.                           - Gastroesophageal flap valve classified as Hill                            Grade II (fold present, opens with respiration).                           - Gastritis. Biopsied.                           - Erythematous duodenopathy. Biopsied. Recommendation:           - Resume previous diet.                           - Continue present medications.                           - Await pathology results.                            - Follow an antireflux regimen.                           - Start Nexium '20mg'$  daily, 30 minutes before dinner                           - Obtain modified barium swallow                           - Follow up in office visit next available                            appointment after swallow study Mauri Pole, MD 06/23/2022 4:00:14 PM This report has been signed electronically.

## 2022-06-23 NOTE — Progress Notes (Signed)
Called to room to assist during endoscopic procedure.  Patient ID and intended procedure confirmed with present staff. Received instructions for my participation in the procedure from the performing physician.  

## 2022-06-23 NOTE — Patient Instructions (Signed)
Handout given on gastritis.  OU HAD AN ENDOSCOPIC PROCEDURE TODAY AT [facility]:   Refer to the procedure report that was given to you for any specific questions about what was found during the examination.  If the procedure report does not answer your questions, please call your gastroenterologist to clarify.  If you requested that your care partner not be given the details of your procedure findings, then the procedure report has been included in a sealed envelope for you to review at your convenience later.  YOU SHOULD EXPECT: Some feelings of bloating in the abdomen. Passage of more gas than usual.  Walking can help get rid of the air that was put into your GI tract during the procedure and reduce the bloating. If you had a lower endoscopy (such as a colonoscopy or flexible sigmoidoscopy) you may notice spotting of blood in your stool or on the toilet paper. If you underwent a bowel prep for your procedure, you may not have a normal bowel movement for a few days.  Please Note:  You might notice some irritation and congestion in your nose or some drainage.  This is from the oxygen used during your procedure.  There is no need for concern and it should clear up in a day or so.  SYMPTOMS TO REPORT IMMEDIATELY:  Following upper endoscopy (EGD)  Vomiting of blood or coffee ground material  New chest pain or pain under the shoulder blades  Painful or persistently difficult swallowing  New shortness of breath  Fever of 100F or higher  Black, tarry-looking stools  For urgent or emergent issues, a gastroenterologist can be reached at any hour by calling [phone]. Do not use MyChart messaging for urgent concerns.    DIET:  We do recommend a small meal at first, but then you may proceed to your regular diet.  Drink plenty of fluids but you should avoid alcoholic beverages for 24 hours.  ACTIVITY:  You should plan to take it easy for the rest of today and you should NOT DRIVE  or use heavy machinery until tomorrow (because of the sedation medicines used during the test).    FOLLOW UP: Our staff will call the number listed on your records the next business day following your procedure.  We will call around 7:15- 8:00 am to check on you and address any questions or concerns that you may have regarding the information given to you following your procedure. If we do not reach you, we will leave a message.     If any biopsies were taken you will be contacted by phone or by letter within the next 1-3 weeks.  Please call us at [phone] if you have not heard about the biopsies in 3 weeks.    SIGNATURES/CONFIDENTIALITY: You and/or your care partner have signed paperwork which will be entered into your electronic medical record.  These signatures attest to the fact that that the information above on your After Visit Summary has been reviewed and is understood.  Full responsibility of the confidentiality of this discharge information lies with you and/or your care-partner.

## 2022-06-24 ENCOUNTER — Telehealth: Payer: Self-pay

## 2022-06-24 NOTE — Telephone Encounter (Signed)
  Follow up Call-     06/23/2022    3:17 PM 06/23/2022    3:09 PM  Call back number  Post procedure Call Back phone  # [phone]   Permission to leave phone message  Yes     Patient questions:  Do you have a fever, pain , or abdominal swelling? No. Pain Score  0 *  Have you tolerated food without any problems? Yes.    Have you been able to return to your normal activities? Yes.    Do you have any questions about your discharge instructions: Diet   No. Medications  No. Follow up visit  No.  Do you have questions or concerns about your Care? No.  Actions: * If pain score is 4 or above: No action needed, pain <4.

## 2022-06-25 ENCOUNTER — Telehealth (HOSPITAL_COMMUNITY): Payer: Self-pay

## 2022-06-25 NOTE — Telephone Encounter (Signed)
Attempted to contact patient to schedule Modified Barium Swallow - left voicemail. 

## 2022-06-27 ENCOUNTER — Other Ambulatory Visit: Payer: Self-pay

## 2022-06-27 DIAGNOSIS — R070 Pain in throat: Secondary | ICD-10-CM

## 2022-06-27 DIAGNOSIS — R09A2 Foreign body sensation, throat: Secondary | ICD-10-CM

## 2022-07-03 ENCOUNTER — Other Ambulatory Visit (HOSPITAL_COMMUNITY): Payer: Self-pay | Admitting: *Deleted

## 2022-07-03 DIAGNOSIS — R1310 Dysphagia, unspecified: Secondary | ICD-10-CM

## 2022-07-09 ENCOUNTER — Other Ambulatory Visit (HOSPITAL_COMMUNITY): Payer: Self-pay

## 2022-07-09 ENCOUNTER — Telehealth: Payer: Self-pay | Admitting: Pharmacy Technician

## 2022-07-09 ENCOUNTER — Other Ambulatory Visit: Payer: Self-pay

## 2022-07-09 MED ORDER — TALICIA 250-12.5-10 MG PO CPDR: 4.0000 | DELAYED_RELEASE_CAPSULE | Freq: Three times a day (TID) | ORAL | 0 refills | Status: AC

## 2022-07-09 NOTE — Telephone Encounter (Signed)
PA has been submitted.

## 2022-07-09 NOTE — Telephone Encounter (Addendum)
Patient Advocate Encounter  Received notification from [place] that prior authorization for Julie [name] is required.   PA submitted on 12.27.23 Key [id number]  Status is pending

## 2022-07-09 NOTE — Telephone Encounter (Signed)
Patient Advocate Encounter  Prior Authorization for Julie [name] has been approved.    PA# [id number] Effective dates: 12.27.23 through 12.31.24

## 2022-07-09 NOTE — Telephone Encounter (Signed)
H Pylori infection  Gastritis

## 2022-07-10 ENCOUNTER — Telehealth: Payer: Self-pay

## 2022-07-10 NOTE — Telephone Encounter (Signed)
Patient is aware of her results and the plan of care. She is currently out of town. Julie [name] is covered by her insurance. When she comes home, she will check with her pharmacy on the cost of [place]. If it is too expensive for her, she will contact me.

## 2022-07-10 NOTE — Telephone Encounter (Signed)
-----   Message from Mauri Pole, MD sent at 07/09/2022 10:37 AM EST ----- H.pylori positive on gastric biopsies, duodenal biopsies negative for any specific abnormality. Please inform patient the results. Thanks  Please send prescription for Talicia or Pylera course pack and PPI BID, if not covered by insurance send Rx for Bismuth 524 mg four times daily, Flagyl '250mg'$  1 tablet four times daily, Doxycycline '100mg'$  capsule Twice daily X 10 days along with PPI BID (Lansoprazole, Omeprazole or Nexium).  Will need to confirm eradication in 6 weeks by checking H.pylori stool Ag ([employer])

## 2022-07-22 ENCOUNTER — Encounter (HOSPITAL_COMMUNITY): Payer: PPO

## 2022-07-22 ENCOUNTER — Ambulatory Visit (HOSPITAL_COMMUNITY): Admission: RE | Admit: 2022-07-22 | Payer: PPO | Source: Ambulatory Visit

## 2022-08-05 ENCOUNTER — Ambulatory Visit (HOSPITAL_COMMUNITY)
Admission: RE | Admit: 2022-08-05 | Discharge: 2022-08-05 | Disposition: A | Discharge status: Home / Self Care | Payer: PPO | Source: Ambulatory Visit | Attending: Gastroenterology | Admitting: Gastroenterology

## 2022-08-05 DIAGNOSIS — R09A2 Foreign body sensation, throat: Secondary | ICD-10-CM

## 2022-08-05 DIAGNOSIS — R1310 Dysphagia, unspecified: Secondary | ICD-10-CM

## 2022-08-05 DIAGNOSIS — R070 Pain in throat: Secondary | ICD-10-CM | POA: Diagnosis not present

## 2022-08-06 ENCOUNTER — Other Ambulatory Visit: Payer: Self-pay | Admitting: Adult Health

## 2022-08-06 DIAGNOSIS — F419 Anxiety disorder, unspecified: Secondary | ICD-10-CM

## 2022-08-06 NOTE — Telephone Encounter (Signed)
Okay for refill?  

## 2022-11-11 ENCOUNTER — Ambulatory Visit (INDEPENDENT_AMBULATORY_CARE_PROVIDER_SITE_OTHER): Payer: PPO | Admitting: Adult Health

## 2022-11-11 ENCOUNTER — Encounter: Payer: Self-pay | Admitting: Adult Health

## 2022-11-11 VITALS — BP 120/72 | HR 79 | Temp 97.9°F | Ht 67.0 in | Wt 201.0 lb

## 2022-11-11 DIAGNOSIS — N390 Urinary tract infection, site not specified: Secondary | ICD-10-CM

## 2022-11-11 NOTE — Progress Notes (Signed)
Subjective:    Patient ID: Julie [name], female    DOB: Feb 02, 1946, 77 y.o.   MRN: [id number]  HPI 77 year old female who  has a past medical history of ALLERGIC RHINITIS, Anxiety, Arthritis, ARTHRITIS, TRAUMATIC, ANKLE, Cataract, Colitis, Depression, Dyslipidemia, EXOGENOUS OBESITY, GERD (gastroesophageal reflux disease), Hemorrhoids, Hyperglycemia, and Melanoma in situ of left lower leg (HCC).  She went to urgent care 4 days ago and was diagnosed with a UTI. This is her second UTI in the last month and she had another a month prior. She was placed on Bactrim DS 900-160 mg BID. She has been referred to [facility].   Since starting on Bactrim her symptoms have improved but she continued to be tender    Review of Systems See HPI   Past Medical History:  Diagnosis Date   ALLERGIC RHINITIS    Anxiety    chronic BZs   Arthritis    ARTHRITIS, TRAUMATIC, ANKLE    Cataract    Colitis    Depression    Dyslipidemia    EXOGENOUS OBESITY    GERD (gastroesophageal reflux disease)    Hemorrhoids    Hyperglycemia    Melanoma in situ of left lower leg (HCC)     Social History   Socioeconomic History   Marital status: Widowed    Spouse name: Not on file   Number of children: 2   Years of education: Not on file   Highest education level: Not on file  Occupational History   Occupation: retired  Tobacco Use   Smoking status: Never   Smokeless tobacco: Never  Vaping Use   Vaping Use: Never used  Substance and Sexual Activity   Alcohol use: Yes    Comment: social   Drug use: No   Sexual activity: Not on file  Other Topics Concern   Not on file  Social History Narrative   Widowed (spouse - Ron of >44years prior to death)   widowed 04-11-11 - 2 grown supportive dtrs, 5 g-kis (3 are triplets)   Social Determinants of Health   Financial Resource Strain: Low Risk  (03/10/2022)   Overall Financial Resource Strain (CARDIA)    Difficulty of Paying Living Expenses: Not  hard at all  Food Insecurity: No Food Insecurity (03/10/2022)   Hunger Vital Sign    Worried About Running Out of Food in the Last Year: Never true    Ran Out of Food in the Last Year: Never true  Transportation Needs: No Transportation Needs (03/10/2022)   PRAPARE - Administrator, Civil Service (Medical): No    Lack of Transportation (Non-Medical): No  Physical Activity: Inactive (03/10/2022)   Exercise Vital Sign    Days of Exercise per Week: 0 days    Minutes of Exercise per Session: 0 min  Stress: No Stress Concern Present (03/10/2022)   [employer] of Occupational Health - Occupational Stress Questionnaire    Feeling of Stress : Only a little  Social Connections: Socially Isolated (03/08/2021)   Social Connection and Isolation Panel [NHANES]    Frequency of Communication with Friends and Family: Three times a week    Frequency of Social Gatherings with Friends and Family: Three times a week    Attends Religious Services: Never    Active Member of Clubs or Organizations: No    Attends Banker Meetings: Never    Marital Status: Widowed  Intimate Partner Violence: Not At Risk (03/08/2021)   Humiliation, Afraid, Rape,  and Kick questionnaire    Fear of Current or Ex-Partner: No    Emotionally Abused: No    Physically Abused: No    Sexually Abused: No    Past Surgical History:  Procedure Laterality Date   ABDOMINAL HYSTERECTOMY  10/1978   ANKLE ARTHROSCOPY Right 10/19/12   deorio   arthroscopy left knee  05/2002, 04/2010   BREAST SURGERY  11/1988   Benign mass (R) breast   C. surgery history and scan sheet     Cataract surgery     (L) eye 09/06/12 & (R) eye 10/11/12   CHOLECYSTECTOMY  09/2004   cysto sling  10/2006   3 times   FRACTURE SURGERY     right ankle   JOINT REPLACEMENT  06/2010   R ankle   MASS EXCISION     under right arm   removal multiple cyst breast left and right     sling anterior repair  12/1999   SPINE SURGERY     laminotomy  with decompression   Toe Exostosis  10/08   Bil   TOTAL ANKLE REPLACEMENT Right     Family History  Problem Relation Age of Onset   Diabetes Mother    Hyperlipidemia Mother    Hypertension Mother    Dementia Mother    Hyperlipidemia Father    Coronary artery disease Father        cabg 36yo   Leukemia Father    Hypertension Father    Heart attack Brother 47       stents x 2 (51 & 40 yo)   Diabetes Brother    Anal fissures Brother    Colon cancer Neg Hx    Esophageal cancer Neg Hx    Liver disease Neg Hx     Allergies  Allergen Reactions   Celexa [Citalopram] Other (See Comments)    Confusion    Hydrocodone-Acetaminophen     REACTION: hives, itiching   Latex     Causes redness and itching   Lisinopril Cough   Oxycodone-Acetaminophen Itching    REACTION: itching    Current Outpatient Medications on File Prior to Visit  Medication Sig Dispense Refill   ALPRAZolam (XANAX) 1 MG tablet TAKE 1 TABLET(1 MG) BY MOUTH TWICE DAILY AS NEEDED FOR ANXIETY 60 tablet 2   aspirin 81 MG tablet Take 81 mg by mouth daily.       atorvastatin (LIPITOR) 20 MG tablet TAKE 1 TABLET(20 MG) BY MOUTH DAILY 90 tablet 3   buPROPion (WELLBUTRIN XL) 300 MG 24 hr tablet Take 1 tablet (300 mg total) by mouth daily. 90 tablet 1   Calcium Carbonate-Vitamin D 600-400 MG-UNIT tablet Take 1 tablet by mouth daily.       diclofenac Sodium (VOLTAREN ARTHRITIS PAIN) 1 % GEL Apply 2 g topically 4 (four) times daily. 50 g 0   esomeprazole (NEXIUM) 20 MG capsule Take 1 capsule (20 mg total) by mouth daily at 6 PM. Take 30 minutes before dinner 90 capsule 1   famotidine (PEPCID) 20 MG tablet Take 1 tablet (20 mg total) by mouth at bedtime. 90 tablet 0   losartan (COZAAR) 25 MG tablet TAKE 1 TABLET(25 MG) BY MOUTH DAILY 90 tablet 3   Multiple Vitamin (MULTIVITAMIN) tablet Take 1 tablet by mouth daily.       RA ALLERGY RELIEF 180 MG tablet take 1 tablet by mouth once daily 30 tablet 5    sulfamethoxazole-trimethoprim (BACTRIM DS) 800-160 MG tablet Take 1 tablet by  mouth 2 (two) times daily.     No current facility-administered medications on file prior to visit.    BP 120/72   Pulse 79   Temp 97.9 F (36.6 C) (Oral)   Ht 5\' 7"  (1.702 m)   Wt 201 lb (91.2 kg)   SpO2 94%   BMI 31.48 kg/m       Objective:   Physical Exam Vitals and nursing note reviewed.  Constitutional:      Appearance: Normal appearance.  Cardiovascular:     Rate and Rhythm: Normal rate and regular rhythm.     Pulses: Normal pulses.     Heart sounds: Normal heart sounds.  Pulmonary:     Effort: Pulmonary effort is normal.     Breath sounds: Normal breath sounds.  Abdominal:     Tenderness: There is abdominal tenderness. There is right CVA tenderness. There is no guarding.  Skin:    General: Skin is warm and dry.  Neurological:     General: No focal deficit present.     Mental Status: She is alert and oriented to person, place, and time.  Psychiatric:        Mood and Affect: Mood normal.        Behavior: Behavior normal.        Thought Content: Thought content normal.        Judgment: Judgment normal.       Assessment & Plan:  1. Urinary tract infection without hematuria, site unspecified - Continue with abx therapy until finished - Let me know if not improved by the end of therapy or sooner if symptoms worsen   Shirline Frees, NP

## 2022-12-01 ENCOUNTER — Other Ambulatory Visit: Payer: Self-pay | Admitting: Adult Health

## 2022-12-01 DIAGNOSIS — F32A Depression, unspecified: Secondary | ICD-10-CM

## 2022-12-02 NOTE — Telephone Encounter (Signed)
Okay for refill?  

## 2023-02-26 ENCOUNTER — Encounter (INDEPENDENT_AMBULATORY_CARE_PROVIDER_SITE_OTHER): Payer: Self-pay

## 2023-03-23 ENCOUNTER — Ambulatory Visit (INDEPENDENT_AMBULATORY_CARE_PROVIDER_SITE_OTHER): Payer: PPO

## 2023-03-23 VITALS — Ht 67.5 in | Wt 201.0 lb

## 2023-03-23 DIAGNOSIS — Z0000 Encounter for general adult medical examination without abnormal findings: Secondary | ICD-10-CM | POA: Diagnosis not present

## 2023-03-23 NOTE — Patient Instructions (Addendum)
Julie [name] , Thank you for taking time to come for your Medicare Wellness Visit. I appreciate your ongoing commitment to your health goals. Please review the following plan we discussed and let me know if I can assist you in the future.   Referrals/Orders/Follow-Ups/Clinician Recommendations:   This is a list of the screening recommended for you and due dates:  Health Maintenance  Topic Date Due   Zoster (Shingles) Vaccine (1 of 2) 04/26/1996   DTaP/Tdap/Td vaccine (3 - Tdap) 04/13/2019   Flu Shot  02/12/2023   COVID-19 Vaccine (3 - 2023-24 season) 03/15/2023   Mammogram  06/12/2023   Medicare Annual Wellness Visit  03/22/2024   Pneumonia Vaccine  Completed   DEXA scan (bone density measurement)  Completed   Hepatitis C Screening  Completed   HPV Vaccine  Aged Out   Colon Cancer Screening  Discontinued    Advanced directives: (Copy Requested) Please bring a copy of your health care power of attorney and living will to the office to be added to your chart at your convenience.  Next Medicare Annual Wellness Visit scheduled for next year: Yes

## 2023-03-23 NOTE — Progress Notes (Signed)
Subjective:   Julie [name] is a 77 y.o. female who presents for Medicare Annual (Subsequent) preventive examination.  Visit Complete: Virtual  I connected with  Julie [name] on 03/23/23 by a audio enabled telemedicine application and verified that I am speaking with the correct person using two identifiers.  Patient Location: Home  Provider Location: Home Office  I discussed the limitations of evaluation and management by telemedicine. The patient expressed understanding and agreed to proceed.  Review of Systems    Vital Signs: Unable to obtain new vitals due to this being a telehealth visit.  Cardiac Risk Factors include: advanced age (>72men, >59 women);hypertension     Objective:    Today's Vitals   03/23/23 1056  Weight: 201 lb (91.2 kg)  Height: 5' 7.5" (1.715 m)   Body mass index is 31.02 kg/m.     03/23/2023   11:06 AM 03/10/2022   11:37 AM 03/08/2021    9:51 AM 03/07/2020    2:07 PM 10/11/2016    5:22 PM 04/10/2016   10:47 AM 06/25/2015    2:10 PM  Advanced Directives  Does Patient Have a Medical Advance Directive? Yes No No No No No No  Type of Estate agent of [place];Living will        Copy of Healthcare Power of Attorney in Chart? No - copy requested        Would patient like information on creating a medical advance directive?   No - Patient declined No - Patient declined No - Patient declined --     Current Medications (verified) Outpatient Encounter Medications as of 03/23/2023  Medication Sig   ALPRAZolam (XANAX) 1 MG tablet TAKE 1 TABLET(1 MG) BY MOUTH TWICE DAILY AS NEEDED FOR ANXIETY   aspirin 81 MG tablet Take 81 mg by mouth daily.     atorvastatin (LIPITOR) 20 MG tablet TAKE 1 TABLET(20 MG) BY MOUTH DAILY   buPROPion (WELLBUTRIN XL) 300 MG 24 hr tablet TAKE 1 TABLET(300 MG) BY MOUTH DAILY   Calcium Carbonate-Vitamin D 600-400 MG-UNIT tablet Take 1 tablet by mouth daily.     diclofenac Sodium (VOLTAREN ARTHRITIS PAIN) 1  % GEL Apply 2 g topically 4 (four) times daily.   esomeprazole (NEXIUM) 20 MG capsule Take 1 capsule (20 mg total) by mouth daily at 6 PM. Take 30 minutes before dinner   famotidine (PEPCID) 20 MG tablet Take 1 tablet (20 mg total) by mouth at bedtime.   losartan (COZAAR) 25 MG tablet TAKE 1 TABLET(25 MG) BY MOUTH DAILY   Multiple Vitamin (MULTIVITAMIN) tablet Take 1 tablet by mouth daily.     RA ALLERGY RELIEF 180 MG tablet take 1 tablet by mouth once daily   sulfamethoxazole-trimethoprim (BACTRIM DS) 800-160 MG tablet Take 1 tablet by mouth 2 (two) times daily.   No facility-administered encounter medications on file as of 03/23/2023.    Allergies (verified) Celexa [citalopram], Hydrocodone-acetaminophen, Latex, Lisinopril, and Oxycodone-acetaminophen   History: Past Medical History:  Diagnosis Date   ALLERGIC RHINITIS    Anxiety    chronic BZs   Arthritis    ARTHRITIS, TRAUMATIC, ANKLE    Cataract    Colitis    Depression    Dyslipidemia    EXOGENOUS OBESITY    GERD (gastroesophageal reflux disease)    Hemorrhoids    Hyperglycemia    Melanoma in situ of left lower leg (HCC)    Past Surgical History:  Procedure Laterality Date   ABDOMINAL HYSTERECTOMY  10/1978   ANKLE ARTHROSCOPY Right 10/19/12   deorio   arthroscopy left knee  05/2002, 04/2010   BREAST SURGERY  11/1988   Benign mass (R) breast   C. surgery history and scan sheet     Cataract surgery     (L) eye 09/06/12 & (R) eye 10/11/12   CHOLECYSTECTOMY  09/2004   cysto sling  10/2006   3 times   FRACTURE SURGERY     right ankle   JOINT REPLACEMENT  06/2010   R ankle   MASS EXCISION     under right arm   removal multiple cyst breast left and right     sling anterior repair  12/1999   SPINE SURGERY     laminotomy with decompression   Toe Exostosis  10/08   Bil   TOTAL ANKLE REPLACEMENT Right    Family History  Problem Relation Age of Onset   Diabetes Mother    Hyperlipidemia Mother    Hypertension Mother     Dementia Mother    Hyperlipidemia Father    Coronary artery disease Father        cabg 30yo   Leukemia Father    Hypertension Father    Heart attack Brother 24       stents x 2 (32 & 14 yo)   Diabetes Brother    Anal fissures Brother    Colon cancer Neg Hx    Esophageal cancer Neg Hx    Liver disease Neg Hx    Social History   Socioeconomic History   Marital status: Widowed    Spouse name: Not on file   Number of children: 2   Years of education: Not on file   Highest education level: Not on file  Occupational History   Occupation: retired  Tobacco Use   Smoking status: Never   Smokeless tobacco: Never  Vaping Use   Vaping status: Never Used  Substance and Sexual Activity   Alcohol use: Yes    Comment: social   Drug use: No   Sexual activity: Not on file  Other Topics Concern   Not on file  Social History Narrative   Widowed (spouse - Ron of >44years prior to death)   widowed 03/31/11 - 2 grown supportive dtrs, 5 g-kis (3 are triplets)   Social Determinants of Health   Financial Resource Strain: Low Risk  (03/23/2023)   Overall Financial Resource Strain (CARDIA)    Difficulty of Paying Living Expenses: Not hard at all  Food Insecurity: No Food Insecurity (03/23/2023)   Hunger Vital Sign    Worried About Running Out of Food in the Last Year: Never true    Ran Out of Food in the Last Year: Never true  Transportation Needs: No Transportation Needs (03/23/2023)   PRAPARE - Administrator, Civil Service (Medical): No    Lack of Transportation (Non-Medical): No  Physical Activity: Insufficiently Active (03/23/2023)   Exercise Vital Sign    Days of Exercise per Week: 3 days    Minutes of Exercise per Session: 30 min  Stress: No Stress Concern Present (03/23/2023)   [employer] of Occupational Health - Occupational Stress Questionnaire    Feeling of Stress : Not at all  Social Connections: Moderately Integrated (03/23/2023)   Social Connection and Isolation  Panel [NHANES]    Frequency of Communication with Friends and Family: More than three times a week    Frequency of Social Gatherings with Friends and Family: More than  three times a week    Attends Religious Services: More than 4 times per year    Active Member of Clubs or Organizations: Yes    Attends Banker Meetings: More than 4 times per year    Marital Status: Widowed    Tobacco Counseling Counseling given: Not Answered   Clinical Intake:  Pre-visit preparation completed: Yes  Pain : No/denies pain     BMI - recorded: 31.02 Nutritional Status: BMI > 30  Obese Nutritional Risks: None Diabetes: No  How often do you need to have someone help you when you read instructions, pamphlets, or other written materials from your doctor or pharmacy?: 1 - Never  Interpreter Needed?: No  Information entered by :: Theresa Mulligan LPN   Activities of Daily Living    03/23/2023   11:02 AM  In your present state of health, do you have any difficulty performing the following activities:  Hearing? 0  Vision? 0  Difficulty concentrating or making decisions? 0  Walking or climbing stairs? 0  Dressing or bathing? 0  Doing errands, shopping? 0  Preparing Food and eating ? N  Using the Toilet? N  In the past six months, have you accidently leaked urine? Y  Comment Wears panty Liners. Followed by medical attention  Do you have problems with loss of bowel control? N  Managing your Medications? N  Managing your Finances? N  Housekeeping or managing your Housekeeping? N    Patient Care Team: Shirline Frees, NP as PCP - General (Family Medicine) Candice Camp, MD (Obstetrics and Gynecology) Frederico Hamman, MD (Orthopedic Surgery) Deorio, Lowella Petties, MD (Orthopedic Surgery)  Indicate any recent Medical Services you may have received from other than [facility] providers in the past year (date may be approximate).     Assessment:   This is a routine wellness examination for  Julie [name].  Hearing/Vision screen Hearing Screening - Comments:: Denies hearing difficulties   Vision Screening - Comments:: Wears rx glasses - up to date with routine eye exams with  [facility]   Goals Addressed               This Visit's Progress     Patient Stated (pt-stated)        I want to get center of gravity back.       Depression Screen    03/23/2023   11:01 AM 03/25/2022    4:03 PM 03/10/2022   11:40 AM 01/29/2022    1:09 PM 03/08/2021    9:53 AM 03/08/2021    9:49 AM 01/24/2021    3:38 PM  PHQ 2/9 Scores  PHQ - 2 Score 0 6 2 2  0 0 5  PHQ- 9 Score  27 3 10   10     Fall Risk    03/23/2023   11:03 AM 03/10/2022   11:38 AM 07/17/2021    3:12 PM 03/08/2021    9:52 AM 03/07/2020    2:08 PM  Fall Risk   Falls in the past year? 1 1 1  0 0  Comment  loses balance     Number falls in past yr: 0 1 1 0 0  Injury with Fall? 0 0 1 0 0  Risk for fall due to : No Fall Risks Impaired balance/gait;Medication side effect No Fall Risks No Fall Risks Impaired balance/gait;Medication side effect  Follow up Falls prevention discussed Falls evaluation completed;Education provided;Falls prevention discussed Falls evaluation completed;Follow up appointment Falls evaluation completed Falls evaluation completed;Falls prevention  discussed    MEDICARE RISK AT HOME: Medicare Risk at Home Any stairs in or around the home?: Yes If so, are there any without handrails?: No Home free of loose throw rugs in walkways, pet beds, electrical cords, etc?: Yes Adequate lighting in your home to reduce risk of falls?: Yes Life alert?: No Use of a cane, walker or w/c?: No Grab bars in the bathroom?: Yes Shower chair or bench in shower?: No Elevated toilet seat or a handicapped toilet?: No  TIMED UP AND GO:  Was the test performed?  No    Cognitive Function:    04/10/2016   10:58 AM  MMSE - Mini Mental State Exam  Not completed: --        03/23/2023   11:07 AM 03/10/2022   11:45 AM  6CIT  Screen  What Year? 0 points 0 points  What month? 0 points 0 points  What time? 0 points 0 points  Count back from 20 0 points 0 points  Months in reverse 0 points 0 points  Repeat phrase 0 points 2 points  Total Score 0 points 2 points    Immunizations Immunization History  Administered Date(s) Administered   Fluad Quad(high Dose 65+) 04/12/2019, 04/26/2020   Influenza Split 05/21/2012   Influenza Whole 04/12/2009, 04/16/2010, 04/05/2011   Influenza, High Dose Seasonal PF 04/18/2013, 03/27/2016, 04/22/2018   Influenza,inj,Quad PF,6+ Mos 03/09/2014   Influenza-Unspecified 04/26/2015   PFIZER(Purple Top)SARS-COV-2 Vaccination 08/28/2019, 09/20/2019   Pneumococcal Conjugate-13 03/14/2015   Pneumococcal Polysaccharide-23 06/30/2011   Td 10/16/2005, 04/12/2009   Zoster, Live 02/03/2012    TDAP status: Due, Education has been provided regarding the importance of this vaccine. Advised may receive this vaccine at local pharmacy or Health Dept. Aware to provide a copy of the vaccination record if obtained from local pharmacy or Health Dept. Verbalized acceptance and understanding.  Flu Vaccine status: Due, Education has been provided regarding the importance of this vaccine. Advised may receive this vaccine at local pharmacy or Health Dept. Aware to provide a copy of the vaccination record if obtained from local pharmacy or Health Dept. Verbalized acceptance and understanding.  Pneumococcal vaccine status: Up to date  Covid-19 vaccine status: Declined, Education has been provided regarding the importance of this vaccine but patient still declined. Advised may receive this vaccine at local pharmacy or Health Dept.or vaccine clinic. Aware to provide a copy of the vaccination record if obtained from local pharmacy or Health Dept. Verbalized acceptance and understanding.  Qualifies for Shingles Vaccine? Yes   Zostavax completed No   Shingrix Completed?: No.    Education has been provided  regarding the importance of this vaccine. Patient has been advised to call insurance company to determine out of pocket expense if they have not yet received this vaccine. Advised may also receive vaccine at local pharmacy or Health Dept. Verbalized acceptance and understanding.  Screening Tests Health Maintenance  Topic Date Due   Zoster Vaccines- Shingrix (1 of 2) 04/26/1996   DTaP/Tdap/Td (3 - Tdap) 04/13/2019   INFLUENZA VACCINE  02/12/2023   COVID-19 Vaccine (3 - 2023-24 season) 03/15/2023   MAMMOGRAM  06/12/2023   Medicare Annual Wellness (AWV)  03/22/2024   Pneumonia Vaccine 9+ Years old  Completed   DEXA SCAN  Completed   Hepatitis C Screening  Completed   HPV VACCINES  Aged Out   Colonoscopy  Discontinued    Health Maintenance  Health Maintenance Due  Topic Date Due   Zoster Vaccines- Shingrix (1 of  2) 04/26/1996   DTaP/Tdap/Td (3 - Tdap) 04/13/2019   INFLUENZA VACCINE  02/12/2023   COVID-19 Vaccine (3 - 2023-24 season) 03/15/2023      Mammogram status: Completed 06/11/22. Repeat every year  Bone Density status: Completed 07/04/21. Results reflect: Bone density results: NORMAL. Repeat every   years.  Lung Cancer Screening: (Low Dose CT Chest recommended if Age 70-80 years, 20 pack-year currently smoking OR have quit w/in 15years.) does not qualify.    Additional Screening:  Hepatitis C Screening: does qualify; Completed 03/21/16  Vision Screening: Recommended annual ophthalmology exams for early detection of glaucoma and other disorders of the eye. Is the patient up to date with their annual eye exam?  Yes  Who is the provider or what is the name of the office in which the patient attends annual eye exams? [facility] If pt is not established with a provider, would they like to be referred to a provider to establish care? No .   Dental Screening: Recommended annual dental exams for proper oral hygiene  Community Resource Referral / Chronic Care  Management:  CRR required this visit?  No   CCM required this visit?  No     Plan:     I have personally reviewed and noted the following in the patient's chart:   Medical and social history Use of alcohol, tobacco or illicit drugs  Current medications and supplements including opioid prescriptions. Patient is not currently taking opioid prescriptions. Functional ability and status Nutritional status Physical activity Advanced directives List of other physicians Hospitalizations, surgeries, and ER visits in previous 12 months Vitals Screenings to include cognitive, depression, and falls Referrals and appointments  In addition, I have reviewed and discussed with patient certain preventive protocols, quality metrics, and best practice recommendations. A written personalized care plan for preventive services as well as general preventive health recommendations were provided to patient.     Tillie Rung, LPN   09/13/4399   After Visit Summary: (MyChart) Due to this being a telephonic visit, the after visit summary with patients personalized plan was offered to patient via MyChart   Nurse Notes: None

## 2023-04-15 ENCOUNTER — Other Ambulatory Visit: Payer: Self-pay | Admitting: Adult Health

## 2023-04-15 DIAGNOSIS — E785 Hyperlipidemia, unspecified: Secondary | ICD-10-CM

## 2023-04-15 NOTE — Telephone Encounter (Signed)
Patient need to schedule CPE for more refills.

## 2023-05-21 ENCOUNTER — Other Ambulatory Visit: Payer: Self-pay | Admitting: Adult Health

## 2023-05-21 DIAGNOSIS — E785 Hyperlipidemia, unspecified: Secondary | ICD-10-CM

## 2023-05-25 ENCOUNTER — Other Ambulatory Visit: Payer: Self-pay | Admitting: Adult Health

## 2023-05-25 DIAGNOSIS — I10 Essential (primary) hypertension: Secondary | ICD-10-CM

## 2023-06-01 ENCOUNTER — Other Ambulatory Visit: Payer: Self-pay | Admitting: Adult Health

## 2023-06-01 DIAGNOSIS — F419 Anxiety disorder, unspecified: Secondary | ICD-10-CM

## 2023-06-03 NOTE — Telephone Encounter (Signed)
Refilled for 30 days. Pt needs a CPE for further refills.

## 2023-06-10 ENCOUNTER — Ambulatory Visit: Payer: PPO | Admitting: Family Medicine

## 2023-06-10 ENCOUNTER — Encounter: Payer: Self-pay | Admitting: Family Medicine

## 2023-06-10 VITALS — BP 146/80 | HR 98 | Ht 67.5 in | Wt 209.3 lb

## 2023-06-10 DIAGNOSIS — R300 Dysuria: Secondary | ICD-10-CM

## 2023-06-10 LAB — POC URINALSYSI DIPSTICK (AUTOMATED)
Bilirubin, UA: NEGATIVE
Blood, UA: POSITIVE
Glucose, UA: NEGATIVE
Nitrite, UA: NEGATIVE
Protein, UA: POSITIVE — AB
Spec Grav, UA: 1.025 (ref 1.010–1.025)
Urobilinogen, UA: 0.2 U/dL
pH, UA: 6 (ref 5.0–8.0)

## 2023-06-10 MED ORDER — CEPHALEXIN 500 MG PO CAPS: 500.0000 mg | ORAL_CAPSULE | Freq: Three times a day (TID) | ORAL | 0 refills | Status: AC

## 2023-06-10 NOTE — Progress Notes (Signed)
Established Patient Office Visit  Subjective   Patient ID: Julie [name], female    DOB: 1946/03/28  Age: 77 y.o. MRN: [id number]  Chief Complaint  Patient presents with   Recurrent UTI    HPI   Julie [name] is seen with concerns for recurrent UTI.  She has had 1 day history of burning with urination and some frequency.  No gross hematuria.  Denies any fevers or chills.  No nausea or vomiting.  She has had some back pain but this is more lower lumbar and bilateral.  History of frequent UTIs.  She states that she has been seen multiple times this past year at urgent care with UTI including March, April, and over the summer.  She recalls being treated with Septra and Macrobid.  Each time symptoms seem to clear.  She admits that she does not drink a lot of fluids.  Past Medical History:  Diagnosis Date   ALLERGIC RHINITIS    Anxiety    chronic BZs   Arthritis    ARTHRITIS, TRAUMATIC, ANKLE    Cataract    Colitis    Depression    Dyslipidemia    EXOGENOUS OBESITY    GERD (gastroesophageal reflux disease)    Hemorrhoids    Hyperglycemia    Melanoma in situ of left lower leg [facility])    Past Surgical History:  Procedure Laterality Date   ABDOMINAL HYSTERECTOMY  10/1978   ANKLE ARTHROSCOPY Right 10/19/12   deorio   arthroscopy left knee  05/2002, 04/2010   BREAST SURGERY  11/1988   Benign mass (R) breast   C. surgery history and scan sheet     Cataract surgery     (L) eye 09/06/12 & (R) eye 10/11/12   CHOLECYSTECTOMY  09/2004   cysto sling  10/2006   3 times   FRACTURE SURGERY     right ankle   JOINT REPLACEMENT  06/2010   R ankle   MASS EXCISION     under right arm   removal multiple cyst breast left and right     sling anterior repair  12/1999   SPINE SURGERY     laminotomy with decompression   Toe Exostosis  10/08   Bil   TOTAL ANKLE REPLACEMENT Right     reports that she has never smoked. She has never used smokeless tobacco. She reports current alcohol use. She  reports that she does not use drugs. family history includes Anal fissures in her brother; Coronary artery disease in her father; Dementia in her mother; Diabetes in her brother and mother; Heart attack (age of onset: 60) in her brother; Hyperlipidemia in her father and mother; Hypertension in her father and mother; Leukemia in her father. Allergies  Allergen Reactions   Celexa [Citalopram] Other (See Comments)    Confusion    Hydrocodone-Acetaminophen     REACTION: hives, itiching   Latex     Causes redness and itching   Lisinopril Cough   Oxycodone-Acetaminophen Itching    REACTION: itching    Review of Systems  Constitutional:  Negative for chills and fever.  Gastrointestinal:  Negative for nausea and vomiting.  Genitourinary:  Positive for dysuria and frequency. Negative for flank pain and hematuria.      Objective:     BP (!) 146/80 (BP Location: Left Arm, Patient Position: Sitting, Cuff Size: Normal)   Pulse 98   Ht 5' 7.5" (1.715 m)   Wt 209 lb 4.8 oz (94.9 kg)  SpO2 96%   BMI 32.30 kg/m  BP Readings from Last 3 Encounters:  06/10/23 (!) 146/80  11/11/22 120/72  06/23/22 (!) 119/54   Wt Readings from Last 3 Encounters:  06/10/23 209 lb 4.8 oz (94.9 kg)  03/23/23 201 lb (91.2 kg)  11/11/22 201 lb (91.2 kg)      Physical Exam Vitals reviewed.  Constitutional:      General: She is not in acute distress.    Appearance: She is not ill-appearing.  Cardiovascular:     Rate and Rhythm: Normal rate and regular rhythm.  Pulmonary:     Effort: Pulmonary effort is normal.     Breath sounds: Normal breath sounds. No wheezing or rales.  Neurological:     Mental Status: She is alert.      Results for orders placed or performed in visit on 06/10/23  POCT Urinalysis Dipstick (Automated)  Result Value Ref Range   Color, UA Yellow    Clarity, UA Clear    Glucose, UA Negative Negative   Bilirubin, UA Negative    Ketones, UA Trace    Spec Grav, UA 1.025 1.010 -  1.025   Blood, UA Positive    pH, UA 6.0 5.0 - 8.0   Protein, UA Positive (A) Negative   Urobilinogen, UA 0.2 0.2 or 1.0 E.U./dL   Nitrite, UA Negative    Leukocytes, UA Moderate (2+) (A) Negative      The 10-year ASCVD risk score (Arnett DK, et al., 2019) is: 32.2%    Assessment & Plan:   Problem List Items Addressed This Visit   None Visit Diagnoses     Dysuria    -  Primary   Relevant Orders   POCT Urinalysis Dipstick (Automated) (Completed)   Urine Culture     Patient seen with 1 day history of recurrent dysuria.  Reportedly has had 3 previous UTIs earlier this year including March, April, and over the summer.  Urine dipstick today shows leukocytes and positive blood.  -Urine culture sent -Start cephalexin 500 mg 3 times daily for 7 days -Push fluids -Follow-up immediately for any fever, recurrent nausea and vomiting, or other concerns  No follow-ups on file.    Evelena Peat, MD

## 2023-06-12 LAB — URINE CULTURE
MICRO NUMBER:: 15787712
SPECIMEN QUALITY:: ADEQUATE

## 2023-06-17 LAB — HM MAMMOGRAPHY

## 2023-06-25 ENCOUNTER — Other Ambulatory Visit: Payer: Self-pay | Admitting: Adult Health

## 2023-06-25 DIAGNOSIS — E785 Hyperlipidemia, unspecified: Secondary | ICD-10-CM

## 2023-07-31 ENCOUNTER — Other Ambulatory Visit: Payer: Self-pay | Admitting: Adult Health

## 2023-07-31 DIAGNOSIS — E785 Hyperlipidemia, unspecified: Secondary | ICD-10-CM

## 2023-07-31 DIAGNOSIS — F32A Depression, unspecified: Secondary | ICD-10-CM

## 2023-07-31 MED ORDER — ALPRAZOLAM 1 MG PO TABS: ORAL_TABLET | ORAL | 0 refills | Status: DC

## 2023-07-31 MED ORDER — BUPROPION HCL ER (XL) 300 MG PO TB24: 300.0000 mg | ORAL_TABLET | Freq: Every day | ORAL | 0 refills | Status: DC

## 2023-07-31 MED ORDER — ATORVASTATIN CALCIUM 20 MG PO TABS: ORAL_TABLET | ORAL | 0 refills | Status: DC

## 2023-07-31 NOTE — Telephone Encounter (Signed)
Okay for refill?    Please advise pt has been seen within a year but is due for CPE. Tried calling to schedule but no answer.

## 2023-07-31 NOTE — Telephone Encounter (Signed)
Copied from CRM [id number]. Topic: Clinical - Medication Refill >> Jul 31, 2023  4:08 PM Lennart Pall wrote: Most Recent Primary Care Visit:  Provider: Kristian Covey  Department: [facility]  Visit Type: OFFICE VISIT  Date: 06/10/2023  Medication: ALPRAZolam ; atorvastatin ;   Has the patient contacted their pharmacy?  (Agent: If no, request that the patient contact the pharmacy for the refill. If patient does not wish to contact the pharmacy document the reason why and proceed with request.) (Agent: If yes, when and what did the pharmacy advise?)  Is this the correct pharmacy for this prescription?  If no, delete pharmacy and type the correct one.  This is the patient's preferred pharmacy:  [id number] - [place], [place] - [address] [place] [address] Phone: [phone] Fax: [phone]  [id number] [address] - [place], [place] - [address]  [place] [address] Phone: [phone] Fax: [phone]  [id number] [place], [place] - [address] AT [facility] [address] [facility] [address] [facility] [address] [place] [address] Phone: [phone] Fax: [phone]   Has the prescription been filled recently?   Is the patient out of the medication?   Has the patient been seen for an appointment in the last year OR does the patient have an upcoming appointment?   Can we respond through MyChart?   Agent: Please be advised that Rx refills may take up to 3 business days. We ask that you follow-up with your pharmacy.

## 2023-09-02 ENCOUNTER — Other Ambulatory Visit: Payer: Self-pay | Admitting: Adult Health

## 2023-09-02 DIAGNOSIS — E785 Hyperlipidemia, unspecified: Secondary | ICD-10-CM

## 2023-09-03 ENCOUNTER — Other Ambulatory Visit: Payer: Self-pay | Admitting: Adult Health

## 2023-09-03 DIAGNOSIS — F32A Depression, unspecified: Secondary | ICD-10-CM

## 2023-10-06 ENCOUNTER — Telehealth: Payer: Self-pay

## 2023-10-06 ENCOUNTER — Other Ambulatory Visit: Payer: Self-pay | Admitting: Adult Health

## 2023-10-06 DIAGNOSIS — E785 Hyperlipidemia, unspecified: Secondary | ICD-10-CM

## 2023-10-06 DIAGNOSIS — F32A Depression, unspecified: Secondary | ICD-10-CM

## 2023-10-06 NOTE — Telephone Encounter (Signed)
**Note De-identified  [place] Obfuscation** Please advise 

## 2023-10-06 NOTE — Telephone Encounter (Signed)
 Copied from CRM [id number]. Topic: Clinical - Prescription Issue >> Oct 06, 2023 12:42 PM Elizebeth Brooking wrote: Reason for CRM: Patient called in stated the pharmacy would not refilled medication informed her because she needs a appointment , she stated she will be busy all this week so she can't come in none this week but schedule for Tuesday of next week, wanted to know if Kandee Keen could send her medication iALPRAZolam (XANAX) 1 MG tablet n to last her til then

## 2023-10-07 ENCOUNTER — Other Ambulatory Visit: Payer: Self-pay | Admitting: Adult Health

## 2023-10-07 DIAGNOSIS — F419 Anxiety disorder, unspecified: Secondary | ICD-10-CM

## 2023-10-07 MED ORDER — ALPRAZOLAM 1 MG PO TABS: ORAL_TABLET | ORAL | 0 refills | Status: DC

## 2023-10-07 NOTE — Telephone Encounter (Signed)
 Patient notified of update  and verbalized understanding.

## 2023-10-13 ENCOUNTER — Encounter: Payer: Self-pay | Admitting: Adult Health

## 2023-10-13 ENCOUNTER — Ambulatory Visit (INDEPENDENT_AMBULATORY_CARE_PROVIDER_SITE_OTHER): Admitting: Adult Health

## 2023-10-13 VITALS — BP 110/70 | HR 92 | Temp 98.0°F | Wt 205.0 lb

## 2023-10-13 DIAGNOSIS — T753XXA Motion sickness, initial encounter: Secondary | ICD-10-CM

## 2023-10-13 DIAGNOSIS — Z0000 Encounter for general adult medical examination without abnormal findings: Secondary | ICD-10-CM | POA: Diagnosis not present

## 2023-10-13 DIAGNOSIS — E785 Hyperlipidemia, unspecified: Secondary | ICD-10-CM | POA: Diagnosis not present

## 2023-10-13 DIAGNOSIS — I10 Essential (primary) hypertension: Secondary | ICD-10-CM

## 2023-10-13 DIAGNOSIS — F32A Depression, unspecified: Secondary | ICD-10-CM

## 2023-10-13 DIAGNOSIS — F419 Anxiety disorder, unspecified: Secondary | ICD-10-CM | POA: Diagnosis not present

## 2023-10-13 MED ORDER — ATORVASTATIN CALCIUM 20 MG PO TABS: ORAL_TABLET | ORAL | 3 refills | Status: AC

## 2023-10-13 MED ORDER — SCOPOLAMINE 1 MG/3DAYS TD PT72: 1.0000 | MEDICATED_PATCH | TRANSDERMAL | 1 refills | Status: AC

## 2023-10-13 NOTE — Progress Notes (Addendum)
 Subjective:    Patient ID: Julie [name], female    DOB: 1946-03-26, 78 y.o.   MRN: [id number]  HPI Patient presents for yearly preventative medicine examination. She is a 78 year old female who  has a past medical history of ALLERGIC RHINITIS, Anxiety, Arthritis, ARTHRITIS, TRAUMATIC, ANKLE, Cataract, Colitis, Depression, Dyslipidemia, EXOGENOUS OBESITY, GERD (gastroesophageal reflux disease), Hemorrhoids, Hyperglycemia, and Melanoma in situ of left lower leg (HCC).  Hyperlipidemia-takes Lipitor 20 mg and aspirin 81 mg daily.  She denies myalgia or fatigue.  Lab Results  Component Value Date   CHOL 138 01/24/2021   HDL 61.10 01/24/2021   LDLCALC 63 01/24/2021   LDLDIRECT 130.6 12/18/2008   TRIG 70.0 01/24/2021   CHOLHDL 2 01/24/2021   Hypertension -controlled with Cozaar 25 mg daily.  She denies dizziness, lightheadedness, chest pain, shortness of breath BP Readings from Last 3 Encounters:  10/13/23 110/70  06/10/23 (!) 146/80  11/11/22 120/72   Anxiety/Depression - currently prescribed Wellburin 300 mg and Xanax 1 mg BID PRN.  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  She is currently looking for facility  Motion Sickness - she is going on a cruise and does gets motion sickness on boats.   All immunizations and health maintenance protocols were reviewed with the patient and needed orders were placed. Refuses shingles vaccination and tdap at this time.   Appropriate screening laboratory values were ordered for the patient including screening of hyperlipidemia, renal function and hepatic function.  Medication reconciliation,  past medical history, social history, problem list and allergies were reviewed in detail with the patient  Goals were established with regard to weight loss, exercise, and  diet in compliance with medications Wt Readings from Last 3 Encounters:   10/13/23 205 lb (93 kg)  06/10/23 209 lb 4.8 oz (94.9 kg)  03/23/23 201 lb (91.2 kg)   She is up to date on routine mammograms.   Review of Systems  Constitutional: Negative.   HENT: Negative.    Eyes: Negative.   Respiratory: Negative.    Cardiovascular: Negative.   Gastrointestinal: Negative.   Endocrine: Negative.   Genitourinary: Negative.   Musculoskeletal: Negative.   Skin: Negative.   Allergic/Immunologic: Negative.   Neurological: Negative.   Hematological: Negative.   Psychiatric/Behavioral: Negative.     Past Medical History:  Diagnosis Date   ALLERGIC RHINITIS    Anxiety    chronic BZs   Arthritis    ARTHRITIS, TRAUMATIC, ANKLE    Cataract    Colitis    Depression    Dyslipidemia    EXOGENOUS OBESITY    GERD (gastroesophageal reflux disease)    Hemorrhoids    Hyperglycemia    Melanoma in situ of left lower leg (HCC)     Social History   Socioeconomic History   Marital status: Widowed    Spouse name: Not on file   Number of children: 2   Years of education: Not on file   Highest education level: Not on file  Occupational History   Occupation: retired  Tobacco Use   Smoking status: Never   Smokeless tobacco: Never  Vaping Use   Vaping status: Never Used  Substance and Sexual Activity   Alcohol use: Yes    Comment: social   Drug use: No   Sexual activity: Not on file  Other Topics Concern   Not on file  Social History Narrative   Widowed (spouse - Ron of >44years prior to death)   widowed 04-07-11 - 2 grown supportive dtrs, 5 g-kis (3 are triplets)   Social Drivers of Corporate investment banker Strain: Low Risk  (03/23/2023)   Overall Financial Resource Strain (CARDIA)    Difficulty of Paying Living Expenses: Not hard at all  Food Insecurity: No Food Insecurity (03/23/2023)   Hunger Vital Sign    Worried About Running Out of Food in the Last Year: Never true    Ran Out of Food in the Last Year: Never true  Transportation Needs: No  Transportation Needs (03/23/2023)   PRAPARE - Administrator, Civil Service (Medical): No    Lack of Transportation (Non-Medical): No  Physical Activity: Insufficiently Active (03/23/2023)   Exercise Vital Sign    Days of Exercise per Week: 3 days    Minutes of Exercise per Session: 30 min  Stress: No Stress Concern Present (03/23/2023)   [employer] of Occupational Health - Occupational Stress Questionnaire    Feeling of Stress : Not at all  Social Connections: Moderately Integrated (03/23/2023)   Social Connection and Isolation Panel [NHANES]    Frequency of Communication with Friends and Family: More than three times a week    Frequency of Social Gatherings with Friends and Family: More than three times a week    Attends Religious Services: More than 4 times per year    Active Member of [employer] or Organizations: Yes    Attends Banker Meetings: More than 4 times per year    Marital Status: Widowed  Intimate Partner Violence: Not At Risk (03/23/2023)   Humiliation, Afraid, Rape, and Kick questionnaire    Fear of Current or Ex-Partner: No    Emotionally Abused: No    Physically Abused: No    Sexually Abused: No    Past Surgical History:  Procedure Laterality Date   ABDOMINAL HYSTERECTOMY  10/1978   ANKLE ARTHROSCOPY Right 10/19/12   deorio   arthroscopy left knee  05/2002, 04/2010   BREAST SURGERY  11/1988   Benign mass (R) breast   C. surgery history and scan sheet     Cataract surgery     (L) eye 09/06/12 & (R) eye 10/11/12   CHOLECYSTECTOMY  09/2004   cysto sling  10/2006   3 times   FRACTURE SURGERY     right ankle   JOINT REPLACEMENT  06/2010   R ankle   MASS EXCISION     under right arm   removal multiple cyst breast left and right     sling anterior repair  12/1999   SPINE SURGERY     laminotomy with decompression   Toe Exostosis  10/08   Bil   TOTAL ANKLE REPLACEMENT Right     Family History  Problem Relation Age of Onset   Diabetes Mother     Hyperlipidemia Mother    Hypertension Mother    Dementia Mother    Hyperlipidemia Father    Coronary artery disease Father        cabg 71yo   Leukemia Father    Hypertension Father    Heart attack Brother 74       stents x 2 (67 & 32 yo)   Diabetes Brother    Anal fissures Brother    Colon cancer Neg Hx    Esophageal cancer Neg Hx    Liver disease Neg Hx     Allergies  Allergen Reactions   Celexa [Citalopram] Other (See Comments)    Confusion    Hydrocodone-Acetaminophen     REACTION: hives, itiching   Latex     Causes redness and itching   Lisinopril Cough   Oxycodone-Acetaminophen Itching    REACTION: itching    Current Outpatient Medications on File Prior to Visit  Medication Sig Dispense Refill   ALPRAZolam (XANAX) 1 MG tablet TAKE 1 TABLET(1 MG) BY MOUTH TWICE DAILY AS NEEDED FOR ANXIETY. DO FOR PHYSICAL EXAM 60 tablet 0   aspirin 81 MG tablet Take 81 mg by mouth daily.       buPROPion (WELLBUTRIN XL) 300 MG 24 hr tablet TAKE 1 TABLET(300 MG) BY MOUTH DAILY 30 tablet 0   Calcium Carbonate-Vitamin D 600-400 MG-UNIT tablet Take 1 tablet by mouth daily.       cephALEXin (KEFLEX) 500 MG capsule Take 1 capsule (500 mg total) by mouth 3 (three) times daily. 21 capsule 0   estradiol (ESTRACE) 0.1 MG/GM vaginal cream Place vaginally.     losartan (COZAAR) 25 MG tablet TAKE 1 TABLET(25 MG) BY MOUTH DAILY 90 tablet 3   Multiple Vitamin (MULTIVITAMIN) tablet Take 1 tablet by mouth daily.       RA ALLERGY RELIEF 180 MG tablet take 1 tablet by mouth once daily 30 tablet 5   trimethoprim (TRIMPEX) 100 MG tablet Take 100 mg by mouth at bedtime.     No current facility-administered medications on file prior to visit.    BP 110/70   Pulse 92   Temp 98 F (36.7 C)   Wt 205 lb (93 kg)   SpO2 97%   BMI 31.63 kg/m       Objective:   Physical Exam Vitals and nursing note reviewed.  Constitutional:      General: She is not in acute distress.    Appearance: Normal  appearance. She is not ill-appearing.  HENT:     Head: Normocephalic and atraumatic.     Right Ear: Tympanic membrane, ear canal and external ear normal. There is no impacted cerumen.     Left Ear: Tympanic membrane, ear canal and external ear normal. There is no impacted cerumen.     Nose: Nose normal. No congestion or rhinorrhea.     Mouth/Throat:     Mouth: Mucous membranes are moist.     Pharynx: Oropharynx is clear.  Eyes:     Extraocular Movements: Extraocular movements intact.     Conjunctiva/sclera: Conjunctivae normal.     Pupils: Pupils are equal, round, and reactive to light.  Neck:     Vascular: No carotid bruit.  Cardiovascular:     Rate and Rhythm: Normal rate and regular rhythm.     Pulses: Normal pulses.     Heart sounds: No murmur heard.    No friction rub. No gallop.  Pulmonary:     Effort: Pulmonary effort is normal.     Breath sounds: Normal breath sounds.  Abdominal:     General: Abdomen is flat. Bowel sounds are normal. There is no distension.     Palpations: Abdomen is soft. There is no mass.     Tenderness: There is no abdominal tenderness. There is no guarding or rebound.     Hernia: No hernia is present.  Musculoskeletal:        General: Normal range of motion.     Cervical back: Normal range of motion and neck supple.  Lymphadenopathy:     Cervical: No cervical  adenopathy.  Skin:    General: Skin is warm and dry.     Capillary Refill: Capillary refill takes less than 2 seconds.  Neurological:     General: No focal deficit present.     Mental Status: She is alert and oriented to person, place, and time.  Psychiatric:        Mood and Affect: Mood normal.        Behavior: Behavior normal.        Thought Content: Thought content normal.        Judgment: Judgment normal.       Assessment & Plan:  1. Routine general medical examination at a health care facility (Primary) Today patient counseled on age appropriate routine health concerns for  screening and prevention, each reviewed and up to date or declined. Immunizations reviewed and up to date or declined. Labs ordered and reviewed. Risk factors for depression reviewed and negative. Hearing function and visual acuity are intact. ADLs screened and addressed as needed. Functional ability and level of safety reviewed and appropriate. Education, counseling and referrals performed based on assessed risks today. Patient provided with a copy of personalized plan for preventive services.  2. Hyperlipidemia, unspecified hyperlipidemia type - Continue with statin  - CBC with Differential/Platelet; Future - Comprehensive metabolic panel with GFR; Future - Lipid panel; Future - TSH; Future - atorvastatin (LIPITOR) 20 MG tablet; TAKE 1 TABLET(20 MG) BY MOUTH DAILY  Dispense: 90 tablet; Refill: 3 3. Essential hypertension - Well controlled. No change in medication  - CBC with Differential/Platelet; Future - Comprehensive metabolic panel with GFR; Future - Lipid panel; Future - TSH; Future  4. Anxiety and depression - Continue with current plan  - CBC with Differential/Platelet; Future - Comprehensive metabolic panel with GFR; Future - Lipid panel; Future - TSH; Future  5. Motion sickness, initial encounter  - scopolamine (TRANSDERM-SCOP) 1 MG/3DAYS; Place 1 patch (1.5 mg total) onto the skin every 3 (three) days.  Dispense: 24 patch; Refill: 1  Shirline Frees, NP

## 2023-10-14 ENCOUNTER — Other Ambulatory Visit (INDEPENDENT_AMBULATORY_CARE_PROVIDER_SITE_OTHER)

## 2023-10-14 DIAGNOSIS — F419 Anxiety disorder, unspecified: Secondary | ICD-10-CM | POA: Diagnosis not present

## 2023-10-14 DIAGNOSIS — F32A Depression, unspecified: Secondary | ICD-10-CM

## 2023-10-14 DIAGNOSIS — E785 Hyperlipidemia, unspecified: Secondary | ICD-10-CM | POA: Diagnosis not present

## 2023-10-14 DIAGNOSIS — I10 Essential (primary) hypertension: Secondary | ICD-10-CM

## 2023-10-14 LAB — COMPREHENSIVE METABOLIC PANEL WITH GFR
ALT: 19 U/L (ref 0–35)
AST: 20 U/L (ref 0–37)
Albumin: 4 g/dL (ref 3.5–5.2)
Alkaline Phosphatase: 65 U/L (ref 39–117)
BUN: 15 mg/dL (ref 6–23)
CO2: 30 meq/L (ref 19–32)
Calcium: 9 mg/dL (ref 8.4–10.5)
Chloride: 104 meq/L (ref 96–112)
Creatinine, Ser: 0.88 mg/dL (ref 0.40–1.20)
GFR: 63.37 mL/min (ref >60.00)
Glucose, Bld: 121 mg/dL — ABNORMAL HIGH (ref 70–99)
Potassium: 3.8 meq/L (ref 3.5–5.1)
Sodium: 142 meq/L (ref 135–145)
Total Bilirubin: 0.5 mg/dL (ref 0.2–1.2)
Total Protein: 6.7 g/dL (ref 6.0–8.3)

## 2023-10-14 LAB — CBC WITH DIFFERENTIAL/PLATELET
Basophils Absolute: 0.1 10*3/uL (ref 0.0–0.1)
Basophils Relative: 0.8 % (ref 0.0–3.0)
Eosinophils Absolute: 0.2 10*3/uL (ref 0.0–0.7)
Eosinophils Relative: 2.7 % (ref 0.0–5.0)
HCT: 41.9 % (ref 36.0–46.0)
Hemoglobin: 13.9 g/dL (ref 12.0–15.0)
Lymphocytes Relative: 29.5 % (ref 12.0–46.0)
Lymphs Abs: 2.1 10*3/uL (ref 0.7–4.0)
MCHC: 33.3 g/dL (ref 30.0–36.0)
MCV: 90.1 fl (ref 78.0–100.0)
Monocytes Absolute: 0.4 10*3/uL (ref 0.1–1.0)
Monocytes Relative: 6.2 % (ref 3.0–12.0)
Neutro Abs: 4.3 10*3/uL (ref 1.4–7.7)
Neutrophils Relative %: 60.8 % (ref 43.0–77.0)
Platelets: 256 10*3/uL (ref 150.0–400.0)
RBC: 4.65 Mil/uL (ref 3.87–5.11)
RDW: 13.5 % (ref 11.5–15.5)
WBC: 7 10*3/uL (ref 4.0–10.5)

## 2023-10-14 LAB — LIPID PANEL
Cholesterol: 168 mg/dL (ref 0–200)
HDL: 60.6 mg/dL (ref >39.00)
LDL Cholesterol: 77 mg/dL (ref 0–99)
NonHDL: 107.29
Total CHOL/HDL Ratio: 3
Triglycerides: 151 mg/dL — ABNORMAL HIGH (ref 0.0–149.0)
VLDL: 30.2 mg/dL (ref 0.0–40.0)

## 2023-10-14 LAB — TSH: TSH: 2.32 u[IU]/mL (ref 0.35–5.50)

## 2023-10-15 ENCOUNTER — Other Ambulatory Visit: Payer: Self-pay | Admitting: Adult Health

## 2023-10-15 ENCOUNTER — Ambulatory Visit (INDEPENDENT_AMBULATORY_CARE_PROVIDER_SITE_OTHER)

## 2023-10-15 DIAGNOSIS — R739 Hyperglycemia, unspecified: Secondary | ICD-10-CM | POA: Diagnosis not present

## 2023-10-15 LAB — HEMOGLOBIN A1C: Hgb A1c MFr Bld: 6 % (ref 4.6–6.5)

## 2023-11-29 ENCOUNTER — Other Ambulatory Visit: Payer: Self-pay | Admitting: Adult Health

## 2023-11-29 DIAGNOSIS — F419 Anxiety disorder, unspecified: Secondary | ICD-10-CM

## 2023-12-01 NOTE — Telephone Encounter (Signed)
 Okay for refill?

## 2024-01-12 ENCOUNTER — Other Ambulatory Visit: Payer: Self-pay | Admitting: Adult Health

## 2024-01-12 DIAGNOSIS — F419 Anxiety disorder, unspecified: Secondary | ICD-10-CM

## 2024-03-25 ENCOUNTER — Telehealth: Payer: Self-pay

## 2024-03-25 NOTE — Telephone Encounter (Signed)
 Unsuccessful attempts to reach patient on preferred number listed in notes for scheduled AWV. Left message on voicemail okay to reschedule.

## 2024-05-11 ENCOUNTER — Ambulatory Visit (INDEPENDENT_AMBULATORY_CARE_PROVIDER_SITE_OTHER)

## 2024-05-11 VITALS — Ht 67.5 in | Wt 205.0 lb

## 2024-05-11 DIAGNOSIS — Z0000 Encounter for general adult medical examination without abnormal findings: Secondary | ICD-10-CM | POA: Diagnosis not present

## 2024-05-11 NOTE — Progress Notes (Signed)
 Subjective:   Julie [name] is a 78 y.o. who presents for a Medicare Wellness preventive visit.  As a reminder, Annual Wellness Visits don't include a physical exam, and some assessments may be limited, especially if this visit is performed virtually. We may recommend an in-person follow-up visit with your provider if needed.  Visit Complete: Virtual I connected with  Julie [name] on 05/11/24 by a audio enabled telemedicine application and verified that I am speaking with the correct person using two identifiers.  Patient Location: Home  Provider Location: Home Office  I discussed the limitations of evaluation and management by telemedicine. The patient expressed understanding and agreed to proceed.  Vital Signs: Because this visit was a virtual/telehealth visit, some criteria may be missing or patient reported. Any vitals not documented were not able to be obtained and vitals that have been documented are patient reported.  VideoDeclined- This patient declined Librarian, academic. Therefore the visit was completed with audio only.  Persons Participating in Visit: Patient.  AWV Questionnaire: No: Patient Medicare AWV questionnaire was not completed prior to this visit.  Cardiac Risk Factors include: advanced age (>39men, >18 women);hypertension     Objective:    Today's Vitals   05/11/24 1314  Weight: 205 lb (93 kg)  Height: 5' 7.5 (1.715 m)   Body mass index is 31.63 kg/m.     05/11/2024    1:20 PM 03/23/2023   11:06 AM 03/10/2022   11:37 AM 03/08/2021    9:51 AM 03/07/2020    2:07 PM 10/11/2016    5:22 PM 04/10/2016   10:47 AM  Advanced Directives  Does Patient Have a Medical Advance Directive? No Yes No No No No  No   Type of Furniture Conservator/restorer;Living will       Copy of Healthcare Power of Attorney in Chart?  No - copy requested       Would patient like information on creating a medical advance directive?  Yes (MAU/Ambulatory/Procedural Areas - Information given)   No - Patient declined No - Patient declined No - Patient declined  --      Data saved with a previous flowsheet row definition    Current Medications (verified) Outpatient Encounter Medications as of 05/11/2024  Medication Sig   ALPRAZolam  (XANAX ) 1 MG tablet TAKE 1 TABLET BY MOUTH TWICE DAILY AS NEEDED FOR ANXIETY   aspirin 81 MG tablet Take 81 mg by mouth daily.     atorvastatin  (LIPITOR) 20 MG tablet TAKE 1 TABLET(20 MG) BY MOUTH DAILY   Calcium  Carbonate-Vitamin D  600-400 MG-UNIT tablet Take 1 tablet by mouth daily.     estradiol (ESTRACE) 0.1 MG/GM vaginal cream Place vaginally.   losartan  (COZAAR ) 25 MG tablet TAKE 1 TABLET(25 MG) BY MOUTH DAILY   Multiple Vitamin (MULTIVITAMIN) tablet Take 1 tablet by mouth daily.     RA ALLERGY RELIEF 180 MG tablet take 1 tablet by mouth once daily   scopolamine  (TRANSDERM-SCOP) 1 MG/3DAYS Place 1 patch (1.5 mg total) onto the skin every 3 (three) days.   trimethoprim (TRIMPEX) 100 MG tablet Take 100 mg by mouth at bedtime.   buPROPion  (WELLBUTRIN  XL) 300 MG 24 hr tablet TAKE 1 TABLET(300 MG) BY MOUTH DAILY (Patient not taking: Reported on 05/11/2024)   cephALEXin  (KEFLEX ) 500 MG capsule Take 1 capsule (500 mg total) by mouth 3 (three) times daily.   No facility-administered encounter medications on file as of 05/11/2024.  Allergies (verified) Celexa [citalopram], Hydrocodone -acetaminophen , Latex, Lisinopril , and Oxycodone-acetaminophen    History: Past Medical History:  Diagnosis Date   ALLERGIC RHINITIS    Anxiety    chronic BZs   Arthritis    ARTHRITIS, TRAUMATIC, ANKLE    Cataract    Colitis    Depression    Dyslipidemia    EXOGENOUS OBESITY    GERD (gastroesophageal reflux disease)    Hemorrhoids    Hyperglycemia    Melanoma in situ of left lower leg [facility])    Past Surgical History:  Procedure Laterality Date   ABDOMINAL HYSTERECTOMY  10/1978   ANKLE ARTHROSCOPY  Right 10/19/12   deorio   arthroscopy left knee  05/2002, 04/2010   BREAST SURGERY  11/1988   Benign mass (R) breast   C. surgery history and scan sheet     Cataract surgery     (L) eye 09/06/12 & (R) eye 10/11/12   CHOLECYSTECTOMY  09/2004   cysto sling  10/2006   3 times   FRACTURE SURGERY     right ankle   JOINT REPLACEMENT  06/2010   R ankle   MASS EXCISION     under right arm   removal multiple cyst breast left and right     sling anterior repair  12/1999   SPINE SURGERY     laminotomy with decompression   Toe Exostosis  10/08   Bil   TOTAL ANKLE REPLACEMENT Right    Family History  Problem Relation Age of Onset   Diabetes Mother    Hyperlipidemia Mother    Hypertension Mother    Dementia Mother    Hyperlipidemia Father    Coronary artery disease Father        cabg 70yo   Leukemia Father    Hypertension Father    Heart attack Brother 5       stents x 2 (73 & 50 yo)   Diabetes Brother    Anal fissures Brother    Colon cancer Neg Hx    Esophageal cancer Neg Hx    Liver disease Neg Hx    Social History   Socioeconomic History   Marital status: Widowed    Spouse name: Not on file   Number of children: 2   Years of education: Not on file   Highest education level: Not on file  Occupational History   Occupation: retired  Tobacco Use   Smoking status: Never   Smokeless tobacco: Never  Vaping Use   Vaping status: Never Used  Substance and Sexual Activity   Alcohol use: Yes    Comment: social   Drug use: No   Sexual activity: Not on file  Other Topics Concern   Not on file  Social History Narrative   Widowed (spouse - Ron of >44years prior to death)   widowed 03-28-11 - 2 grown supportive dtrs, 5 g-kis (3 are triplets)   Social Drivers of Corporate Investment Banker Strain: Low Risk  (05/11/2024)   Overall Financial Resource Strain (CARDIA)    Difficulty of Paying Living Expenses: Not hard at all  Food Insecurity: No Food Insecurity (05/11/2024)    Hunger Vital Sign    Worried About Running Out of Food in the Last Year: Never true    Ran Out of Food in the Last Year: Never true  Transportation Needs: No Transportation Needs (05/11/2024)   PRAPARE - Administrator, Civil Service (Medical): No    Lack of Transportation (Non-Medical): No  Physical Activity: Insufficiently Active (05/11/2024)   Exercise Vital Sign    Days of Exercise per Week: 3 days    Minutes of Exercise per Session: 30 min  Stress: No Stress Concern Present (03/23/2023)   [employer] of Occupational Health - Occupational Stress Questionnaire    Feeling of Stress : Not at all  Social Connections: Moderately Integrated (05/11/2024)   Social Connection and Isolation Panel    Frequency of Communication with Friends and Family: More than three times a week    Frequency of Social Gatherings with Friends and Family: More than three times a week    Attends Religious Services: More than 4 times per year    Active Member of [employer] or Organizations: Yes    Attends Banker Meetings: More than 4 times per year    Marital Status: Widowed    Tobacco Counseling Counseling given: Not Answered    Clinical Intake:  Pre-visit preparation completed: Yes  Pain : No/denies pain     Diabetes: No  Lab Results  Component Value Date   HGBA1C 6.0 10/15/2023   HGBA1C 6.0 07/28/2018   HGBA1C 5.9 06/29/2017     How often do you need to have someone help you when you read instructions, pamphlets, or other written materials from your doctor or pharmacy?: 1 - Never  Interpreter Needed?: No  Information entered by :: Charmaine Bloodgood LPN   Activities of Daily Living     05/11/2024    1:17 PM  In your present state of health, do you have any difficulty performing the following activities:  Hearing? 0  Vision? 0  Difficulty concentrating or making decisions? 0  Walking or climbing stairs? 0  Dressing or bathing? 0  Doing errands, shopping? 0   Preparing Food and eating ? N  Using the Toilet? N  In the past six months, have you accidently leaked urine? N  Do you have problems with loss of bowel control? N  Managing your Medications? N  Managing your Finances? N  Housekeeping or managing your Housekeeping? N    Patient Care Team: Merna Huxley, NP as PCP - General (Family Medicine) Marget Lenis, MD (Obstetrics and Gynecology) Shari Sieving, MD (Orthopedic Surgery) Mammography, [facility], [facility]  I have updated your Care Teams any recent Medical Services you may have received from other providers in the past year.     Assessment:   This is a routine wellness examination for Julie [name].  Hearing/Vision screen Hearing Screening - Comments:: Patient is able to hear conversational tones without difficulty. No issues reported.   Vision Screening - Comments:: Wears rx glasses - up to date with routine eye exams with Dr. Mevelyn     Goals Addressed             This Visit's Progress    Remain active and independent   On track      Depression Screen     05/11/2024    1:21 PM 03/23/2023   11:01 AM 03/25/2022    4:03 PM 03/10/2022   11:40 AM 01/29/2022    1:09 PM 03/08/2021    9:53 AM 03/08/2021    9:49 AM  PHQ 2/9 Scores  PHQ - 2 Score 0 0 6 2 2  0 0  PHQ- 9 Score   27 3 10       Fall Risk     05/11/2024    1:16 PM 03/23/2023   11:03 AM 03/10/2022   11:38  AM 07/17/2021    3:12 PM 03/08/2021    9:52 AM  Fall Risk   Falls in the past year? 0 1 1 1  0  Comment   loses balance    Number falls in past yr: 0 0 1 1 0  Injury with Fall? 0 0 0 1 0  Risk for fall due to : No Fall Risks No Fall Risks Impaired balance/gait;Medication side effect No Fall Risks No Fall Risks  Follow up Falls prevention discussed;Education provided;Falls evaluation completed Falls prevention discussed Falls evaluation completed;Education provided;Falls prevention discussed  Falls evaluation completed;Follow up  appointment  Falls evaluation completed      Data saved with a previous flowsheet row definition    MEDICARE RISK AT HOME:  Medicare Risk at Home Any stairs in or around the home?: No If so, are there any without handrails?: No Home free of loose throw rugs in walkways, pet beds, electrical cords, etc?: Yes Adequate lighting in your home to reduce risk of falls?: Yes Life alert?: No Use of a cane, walker or w/c?: No Grab bars in the bathroom?: Yes Shower chair or bench in shower?: No Elevated toilet seat or a handicapped toilet?: Yes  TIMED UP AND GO:  Was the test performed?  No  Cognitive Function: 6CIT completed    04/10/2016   10:58 AM  MMSE - Mini Mental State Exam  Not completed: --        05/11/2024    1:19 PM 03/23/2023   11:07 AM 03/10/2022   11:45 AM  6CIT Screen  What Year? 0 points 0 points 0 points  What month? 0 points 0 points 0 points  What time? 0 points 0 points 0 points  Count back from 20 0 points 0 points 0 points  Months in reverse 0 points 0 points 0 points  Repeat phrase 0 points 0 points 2 points  Total Score 0 points 0 points 2 points    Immunizations Immunization History  Administered Date(s) Administered   Fluad Quad(high Dose 65+) 04/12/2019, 04/26/2020, 07/01/2022   INFLUENZA, HIGH DOSE SEASONAL PF 04/18/2013, 03/27/2016, 04/22/2018, 04/17/2023   Influenza Split 05/21/2012   Influenza Whole 04/12/2009, 04/16/2010, 04/05/2011   Influenza,inj,Quad PF,6+ Mos 03/09/2014   Influenza-Unspecified 04/26/2015   PFIZER(Purple Top)SARS-COV-2 Vaccination 08/28/2019, 09/20/2019, 05/07/2020   Pneumococcal Conjugate-13 03/14/2015   Pneumococcal Polysaccharide-23 06/30/2011   Td 10/16/2005, 04/12/2009   Zoster, Live 02/03/2012    Screening Tests Health Maintenance  Topic Date Due   Zoster Vaccines- Shingrix (1 of 2) 04/26/1996   Influenza Vaccine  02/12/2024   COVID-19 Vaccine (4 - 2025-26 season) 03/14/2024   Mammogram  06/16/2024    DTaP/Tdap/Td (3 - Tdap) 10/12/2024 (Originally 04/13/2019)   Medicare Annual Wellness (AWV)  05/11/2025   Pneumococcal Vaccine: 50+ Years  Completed   DEXA SCAN  Completed   Hepatitis C Screening  Completed   Meningococcal B Vaccine  Aged Out   Colonoscopy  Discontinued    Health Maintenance Items Addressed: Vaccines Due: Flu, Shingrix, and TDap ; mammogram scheduled for December   Additional Screening:  Vision Screening: Recommended annual ophthalmology exams for early detection of glaucoma and other disorders of the eye. Is the patient up to date with their annual eye exam?  Yes  Who is the provider or what is the name of the office in which the patient attends annual eye exams? Dr. Mevelyn   Dental Screening: Recommended annual dental exams for proper oral hygiene  Community Resource Referral / Chronic  Care Management: CRR required this visit?  No   CCM required this visit?  No   Plan:    I have personally reviewed and noted the following in the patient's chart:   Medical and social history Use of alcohol, tobacco or illicit drugs  Current medications and supplements including opioid prescriptions. Patient is not currently taking opioid prescriptions. Functional ability and status Nutritional status Physical activity Advanced directives List of other physicians Hospitalizations, surgeries, and ER visits in previous 12 months Vitals Screenings to include cognitive, depression, and falls Referrals and appointments  In addition, I have reviewed and discussed with patient certain preventive protocols, quality metrics, and best practice recommendations. A written personalized care plan for preventive services as well as general preventive health recommendations were provided to patient.   Lavelle Pfeiffer [place], [place]   [id number]/7974   After Visit Summary: (MyChart) Due to this being a telephonic visit, the after visit summary with patients personalized plan was offered to  patient via MyChart   Notes: Nothing significant to report at this time.

## 2024-05-11 NOTE — Patient Instructions (Signed)
 Julie [name],  Thank you for taking the time for your Medicare Wellness Visit. I appreciate your continued commitment to your health goals. Please review the care plan we discussed, and feel free to reach out if I can assist you further.  Medicare recommends these wellness visits once per year to help you and your care team stay ahead of potential health issues. These visits are designed to focus on prevention, allowing your provider to concentrate on managing your acute and chronic conditions during your regular appointments.  Please note that Annual Wellness Visits do not include a physical exam. Some assessments may be limited, especially if the visit was conducted virtually. If needed, we may recommend a separate in-person follow-up with your provider.  Ongoing Care Seeing your primary care provider every 3 to 6 months helps us  monitor your health and provide consistent, personalized care.   Referrals If a referral was made during today's visit and you haven't received any updates within two weeks, please contact the referred provider directly to check on the status.  Recommended Screenings:  Health Maintenance  Topic Date Due   Zoster (Shingles) Vaccine (1 of 2) 04/26/1996   Flu Shot  02/12/2024   COVID-19 Vaccine (4 - 2025-26 season) 03/14/2024   Breast Cancer Screening  06/16/2024   DTaP/Tdap/Td vaccine (3 - Tdap) 10/12/2024*   Medicare Annual Wellness Visit  05/11/2025   Pneumococcal Vaccine for age over 56  Completed   DEXA scan (bone density measurement)  Completed   Hepatitis C Screening  Completed   Meningitis B Vaccine  Aged Out   Colon Cancer Screening  Discontinued  *Topic was postponed. The date shown is not the original due date.       05/11/2024    1:20 PM  Advanced Directives  Does Patient Have a Medical Advance Directive? No  Would patient like information on creating a medical advance directive? Yes (MAU/Ambulatory/Procedural Areas - Information given)    Advance Care Planning is important because it: Ensures you receive medical care that aligns with your values, goals, and preferences. Provides guidance to your family and loved ones, reducing the emotional burden of decision-making during critical moments.  Information on Advanced Care Planning can be found at [place]  Secretary of [facility] Advance Health Care Directives Advance Health Care Directives ([email or web address]   Vision: Annual vision screenings are recommended for early detection of glaucoma, cataracts, and diabetic retinopathy. These exams can also reveal signs of chronic conditions such as diabetes and high blood pressure.  Dental: Annual dental screenings help detect early signs of oral cancer, gum disease, and other conditions linked to overall health, including heart disease and diabetes.  Please see the attached documents for additional preventive care recommendations.

## 2024-05-30 ENCOUNTER — Telehealth: Payer: Self-pay | Admitting: Adult Health

## 2024-05-30 DIAGNOSIS — F419 Anxiety disorder, unspecified: Secondary | ICD-10-CM

## 2024-05-30 NOTE — Telephone Encounter (Unsigned)
 Copied from CRM #[id number]. Topic: Clinical - Medication Refill >> May 30, 2024  9:51 AM Alfonso HERO wrote: Medication: buPROPion  (WELLBUTRIN  XL) 300 MG 24 hr tablet  Has the patient contacted their pharmacy? No (Agent: If no, request that the patient contact the pharmacy for the refill. If patient does not wish to contact the pharmacy document the reason why and proceed with request.) (Agent: If yes, when and what did the pharmacy advise?)  This is the patient's preferred pharmacy:  [id number] [place], [place] - [address] AT [facility] OF [facility] [address] & [facility] [address] [place] [address] Phone: [phone] Fax: [phone]  Is this the correct pharmacy for this prescription? Yes If no, delete pharmacy and type the correct one.   Has the prescription been filled recently? Yes  Is the patient out of the medication? Yes  Has the patient been seen for an appointment in the last year OR does the patient have an upcoming appointment? Yes  Can we respond through MyChart? Yes  Agent: Please be advised that Rx refills may take up to 3 business days. We ask that you follow-up with your pharmacy.

## 2024-05-31 MED ORDER — BUPROPION HCL ER (XL) 300 MG PO TB24: 300.0000 mg | ORAL_TABLET | Freq: Every day | ORAL | 1 refills | Status: AC

## 2024-06-08 ENCOUNTER — Other Ambulatory Visit: Payer: Self-pay | Admitting: Adult Health

## 2024-06-08 ENCOUNTER — Telehealth: Payer: Self-pay

## 2024-06-08 ENCOUNTER — Other Ambulatory Visit: Payer: Self-pay | Admitting: Family Medicine

## 2024-06-08 DIAGNOSIS — I10 Essential (primary) hypertension: Secondary | ICD-10-CM

## 2024-06-08 DIAGNOSIS — F32A Depression, unspecified: Secondary | ICD-10-CM

## 2024-06-08 MED ORDER — LOSARTAN POTASSIUM 25 MG PO TABS: ORAL_TABLET | ORAL | 3 refills | Status: AC

## 2024-06-08 MED ORDER — ALPRAZOLAM 1 MG PO TABS: 1.0000 mg | ORAL_TABLET | Freq: Two times a day (BID) | ORAL | 0 refills | Status: DC | PRN

## 2024-06-08 MED ORDER — ALPRAZOLAM 1 MG PO TABS: 1.0000 mg | ORAL_TABLET | Freq: Two times a day (BID) | ORAL | 2 refills | Status: DC | PRN

## 2024-06-08 NOTE — Telephone Encounter (Signed)
 Copied from CRM #[id number]. Topic: Clinical - Medication Refill >> Jun 08, 2024 10:53 AM Suzen RAMAN wrote: Medication: ALPRAZolam  (XANAX ) 1 MG tablet losartan  (COZAAR ) 25 MG tablet  Has the patient contacted their pharmacy? Yes  This is the patient's preferred pharmacy:  [id number] [place], [place] - [address] AT [facility] OF [facility] [address] & [facility] [address] [place] [address] Phone: [phone] Fax: [phone]  Is this the correct pharmacy for this prescription? Yes If no, delete pharmacy and type the correct one.   Has the prescription been filled recently? No  Is the patient out of the medication? Yes  Has the patient been seen for an appointment in the last year OR does the patient have an upcoming appointment? Yes  Can we respond through MyChart? Yes  Agent: Please be advised that Rx refills may take up to 3 business days. We ask that you follow-up with your pharmacy.

## 2024-06-08 NOTE — Telephone Encounter (Signed)
 Done

## 2024-06-08 NOTE — Telephone Encounter (Signed)
 Copied from CRM [id number]. Topic: Clinical - Prescription Issue >> Jun 08, 2024  2:21 PM Larissa RAMAN wrote: Reason for CRM: Patient states pharmacy has not received prescriptions for ALPRAZolam  (XANAX ) 1 MG tablet and losartan  (COZAAR ) 25 MG tablet. She is requesting to have prescriptions called in as soon possible.   [id number] [place], [place] - [address] AT [facility] OF [facility] [address] & [facility] [address] [place] [address] Phone: [phone] Fax: [phone] >> Jun 08, 2024  2:40 PM China J wrote: Pharmacy calling in to check on the medication status of her alprazolam . I let the pharmacist know that it was approved this morning. Unfortunately, they still do not have the medication sent on their end. She would like if it could be resent and if we could double check that the electronic signature is included.

## 2024-06-08 NOTE — Progress Notes (Signed)
 Julie [name] tried to send in a refill on her Xanax  today, but it was printed rather than sent electronically. I will now send in a RX for #60 of these electronically.

## 2024-06-12 ENCOUNTER — Telehealth: Payer: Self-pay

## 2024-06-12 NOTE — Telephone Encounter (Signed)
 Medications refilled 06/08/2024.   Copied from CRM #[id number]. Topic: Clinical - Prescription Issue >> Jun 08, 2024  2:41 PM Anairis L wrote: Reason for CRM: Pharmacy has not received script   ALPRAZolam  1 mg Oral 2 times daily PRN, for anxiety Losartan  Potassium TAKE 1 TABLET(25 MG) BY MOUTH DAILY  Can someone call verbally.

## 2024-06-14 MED ORDER — ALPRAZOLAM 1 MG PO TABS: 1.0000 mg | ORAL_TABLET | Freq: Two times a day (BID) | ORAL | 2 refills | Status: AC | PRN

## 2024-06-14 NOTE — Addendum Note (Signed)
 Addended by: MERNA DARLEENE CROME on: 06/14/2024 06:32 AM   Modules accepted: Orders

## 2024-06-22 LAB — HM MAMMOGRAPHY

## 2024-06-23 ENCOUNTER — Encounter: Payer: Self-pay | Admitting: Adult Health

## 2025-05-17 ENCOUNTER — Ambulatory Visit
